# Patient Record
Sex: Male | Born: 1955 | Race: Black or African American | Hispanic: No | Marital: Married | Smoking: Current every day smoker
Health system: Southern US, Community
[De-identification: ages and names within clinical notes are randomized; demographics above are authoritative.]

## PROBLEM LIST (undated history)

## (undated) ENCOUNTER — Ambulatory Visit (HOSPITAL_COMMUNITY): Admission: EM | Source: Home / Self Care

## (undated) DIAGNOSIS — R0600 Dyspnea, unspecified: Secondary | ICD-10-CM

## (undated) DIAGNOSIS — E559 Vitamin D deficiency, unspecified: Secondary | ICD-10-CM

## (undated) DIAGNOSIS — E785 Hyperlipidemia, unspecified: Secondary | ICD-10-CM

## (undated) DIAGNOSIS — I10 Essential (primary) hypertension: Secondary | ICD-10-CM

## (undated) DIAGNOSIS — E538 Deficiency of other specified B group vitamins: Secondary | ICD-10-CM

## (undated) DIAGNOSIS — F17200 Nicotine dependence, unspecified, uncomplicated: Secondary | ICD-10-CM

## (undated) HISTORY — DX: Deficiency of other specified B group vitamins: E53.8

## (undated) HISTORY — PX: OTHER SURGICAL HISTORY: SHX169

## (undated) HISTORY — DX: Nicotine dependence, unspecified, uncomplicated: F17.200

## (undated) HISTORY — DX: Hyperlipidemia, unspecified: E78.5

## (undated) HISTORY — DX: Vitamin D deficiency, unspecified: E55.9

## (undated) HISTORY — PX: REPAIR OF PERFORATED ULCER: SHX6065

---

## 2000-02-09 ENCOUNTER — Emergency Department (HOSPITAL_COMMUNITY): Admission: EM | Admit: 2000-02-09 | Discharge: 2000-02-09 | Payer: Self-pay | Admitting: Emergency Medicine

## 2000-02-09 ENCOUNTER — Encounter: Payer: Self-pay | Admitting: Emergency Medicine

## 2000-02-14 ENCOUNTER — Ambulatory Visit (HOSPITAL_COMMUNITY): Admission: RE | Admit: 2000-02-14 | Discharge: 2000-02-14 | Payer: Self-pay | Admitting: Orthopedic Surgery

## 2000-02-14 ENCOUNTER — Encounter: Payer: Self-pay | Admitting: Orthopedic Surgery

## 2000-02-21 ENCOUNTER — Observation Stay (HOSPITAL_COMMUNITY): Admission: RE | Admit: 2000-02-21 | Discharge: 2000-02-22 | Payer: Self-pay | Admitting: Orthopedic Surgery

## 2001-06-22 ENCOUNTER — Emergency Department (HOSPITAL_COMMUNITY): Admission: EM | Admit: 2001-06-22 | Discharge: 2001-06-22 | Payer: Self-pay | Admitting: Emergency Medicine

## 2001-06-22 ENCOUNTER — Encounter: Payer: Self-pay | Admitting: Emergency Medicine

## 2001-11-10 ENCOUNTER — Encounter: Payer: Self-pay | Admitting: Emergency Medicine

## 2001-11-10 ENCOUNTER — Emergency Department (HOSPITAL_COMMUNITY): Admission: EM | Admit: 2001-11-10 | Discharge: 2001-11-10 | Payer: Self-pay | Admitting: Emergency Medicine

## 2001-12-29 ENCOUNTER — Emergency Department (HOSPITAL_COMMUNITY): Admission: EM | Admit: 2001-12-29 | Discharge: 2001-12-29 | Payer: Self-pay | Admitting: Emergency Medicine

## 2003-12-23 ENCOUNTER — Emergency Department (HOSPITAL_COMMUNITY): Admission: EM | Admit: 2003-12-23 | Discharge: 2003-12-23 | Payer: Self-pay | Admitting: Emergency Medicine

## 2005-09-26 ENCOUNTER — Emergency Department (HOSPITAL_COMMUNITY): Admission: EM | Admit: 2005-09-26 | Discharge: 2005-09-26 | Payer: Self-pay | Admitting: Emergency Medicine

## 2008-03-15 ENCOUNTER — Emergency Department (HOSPITAL_COMMUNITY): Admission: EM | Admit: 2008-03-15 | Discharge: 2008-03-15 | Payer: Self-pay | Admitting: Emergency Medicine

## 2009-08-26 ENCOUNTER — Inpatient Hospital Stay (HOSPITAL_COMMUNITY): Admission: EM | Admit: 2009-08-26 | Discharge: 2009-08-28 | Payer: Self-pay | Admitting: Emergency Medicine

## 2010-09-06 ENCOUNTER — Emergency Department (HOSPITAL_COMMUNITY)
Admission: EM | Admit: 2010-09-06 | Discharge: 2010-09-06 | Payer: Self-pay | Source: Home / Self Care | Admitting: Emergency Medicine

## 2010-12-12 LAB — DIFFERENTIAL
Basophils Absolute: 0 10*3/uL (ref 0.0–0.1)
Eosinophils Relative: 1 % (ref 0–5)
Lymphocytes Relative: 12 % (ref 12–46)
Neutro Abs: 6.7 10*3/uL (ref 1.7–7.7)
Neutrophils Relative %: 80 % — ABNORMAL HIGH (ref 43–77)

## 2010-12-12 LAB — BASIC METABOLIC PANEL
Calcium: 8.6 mg/dL (ref 8.4–10.5)
Chloride: 100 mEq/L (ref 96–112)
Creatinine, Ser: 0.8 mg/dL (ref 0.4–1.5)
GFR calc Af Amer: >60 mL/min (ref >60)

## 2010-12-12 LAB — ETHANOL: Alcohol, Ethyl (B): <5 mg/dL (ref 0–10)

## 2010-12-12 LAB — CBC
HCT: 41.2 % (ref 39.0–52.0)
Hemoglobin: 13 g/dL (ref 13.0–17.0)
Platelets: 343 10*3/uL (ref 150–400)
RBC: 3.67 MIL/uL — ABNORMAL LOW (ref 4.22–5.81)
RBC: 3.85 MIL/uL — ABNORMAL LOW (ref 4.22–5.81)
RDW: 15.1 % (ref 11.5–15.5)
WBC: 4.4 10*3/uL (ref 4.0–10.5)
WBC: 7 10*3/uL (ref 4.0–10.5)

## 2010-12-12 LAB — PHOSPHORUS
Phosphorus: 2.4 mg/dL (ref 2.3–4.6)
Phosphorus: 3.8 mg/dL (ref 2.3–4.6)

## 2010-12-12 LAB — CULTURE, BLOOD (ROUTINE X 2)

## 2010-12-12 LAB — CALCIUM: Calcium: 8.6 mg/dL (ref 8.4–10.5)

## 2010-12-12 LAB — POCT I-STAT, CHEM 8
BUN: 12 mg/dL (ref 6–23)
Potassium: 3.7 mEq/L (ref 3.5–5.1)
Sodium: 139 mEq/L (ref 135–145)
TCO2: 27 mmol/L (ref 0–100)

## 2010-12-12 LAB — MAGNESIUM: Magnesium: 1.8 mg/dL (ref 1.5–2.5)
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
## 2011-04-26 ENCOUNTER — Emergency Department (HOSPITAL_COMMUNITY): Payer: Self-pay

## 2011-04-26 ENCOUNTER — Emergency Department (HOSPITAL_COMMUNITY)
Admission: EM | Admit: 2011-04-26 | Discharge: 2011-04-26 | Disposition: A | Discharge status: Home / Self Care | Payer: Self-pay | Attending: Emergency Medicine | Admitting: Emergency Medicine

## 2011-04-26 DIAGNOSIS — R0609 Other forms of dyspnea: Secondary | ICD-10-CM | POA: Insufficient documentation

## 2011-04-26 DIAGNOSIS — M545 Low back pain, unspecified: Secondary | ICD-10-CM | POA: Insufficient documentation

## 2011-04-26 DIAGNOSIS — IMO0002 Reserved for concepts with insufficient information to code with codable children: Secondary | ICD-10-CM | POA: Insufficient documentation

## 2011-04-26 DIAGNOSIS — R0989 Other specified symptoms and signs involving the circulatory and respiratory systems: Secondary | ICD-10-CM | POA: Insufficient documentation

## 2011-04-26 DIAGNOSIS — T148XXA Other injury of unspecified body region, initial encounter: Secondary | ICD-10-CM | POA: Insufficient documentation

## 2011-04-26 DIAGNOSIS — R0602 Shortness of breath: Secondary | ICD-10-CM | POA: Insufficient documentation

## 2011-05-22 ENCOUNTER — Emergency Department (HOSPITAL_COMMUNITY): Payer: Self-pay

## 2011-05-22 ENCOUNTER — Encounter (HOSPITAL_COMMUNITY): Payer: Self-pay

## 2011-05-22 ENCOUNTER — Emergency Department (HOSPITAL_COMMUNITY)
Admission: EM | Admit: 2011-05-22 | Discharge: 2011-05-22 | Disposition: A | Discharge status: Home / Self Care | Payer: No Typology Code available for payment source | Attending: General Surgery | Admitting: General Surgery

## 2011-05-22 DIAGNOSIS — M25559 Pain in unspecified hip: Secondary | ICD-10-CM | POA: Insufficient documentation

## 2011-05-22 DIAGNOSIS — IMO0002 Reserved for concepts with insufficient information to code with codable children: Secondary | ICD-10-CM | POA: Insufficient documentation

## 2011-05-22 DIAGNOSIS — S7010XA Contusion of unspecified thigh, initial encounter: Secondary | ICD-10-CM | POA: Insufficient documentation

## 2011-05-22 DIAGNOSIS — M25529 Pain in unspecified elbow: Secondary | ICD-10-CM | POA: Insufficient documentation

## 2011-05-22 DIAGNOSIS — S7000XA Contusion of unspecified hip, initial encounter: Secondary | ICD-10-CM | POA: Insufficient documentation

## 2011-05-22 DIAGNOSIS — S0180XA Unspecified open wound of other part of head, initial encounter: Secondary | ICD-10-CM | POA: Insufficient documentation

## 2011-05-22 DIAGNOSIS — M542 Cervicalgia: Secondary | ICD-10-CM | POA: Insufficient documentation

## 2011-05-22 DIAGNOSIS — M79609 Pain in unspecified limb: Secondary | ICD-10-CM | POA: Insufficient documentation

## 2011-05-22 DIAGNOSIS — M546 Pain in thoracic spine: Secondary | ICD-10-CM | POA: Insufficient documentation

## 2011-05-22 DIAGNOSIS — M25519 Pain in unspecified shoulder: Secondary | ICD-10-CM | POA: Insufficient documentation

## 2011-05-22 DIAGNOSIS — R404 Transient alteration of awareness: Secondary | ICD-10-CM | POA: Insufficient documentation

## 2011-05-22 DIAGNOSIS — M25569 Pain in unspecified knee: Secondary | ICD-10-CM | POA: Insufficient documentation

## 2011-05-22 DIAGNOSIS — S0990XA Unspecified injury of head, initial encounter: Secondary | ICD-10-CM | POA: Insufficient documentation

## 2011-05-22 DIAGNOSIS — M545 Low back pain, unspecified: Secondary | ICD-10-CM | POA: Insufficient documentation

## 2011-05-22 DIAGNOSIS — R51 Headache: Secondary | ICD-10-CM | POA: Insufficient documentation

## 2011-05-22 LAB — ETHANOL: Alcohol, Ethyl (B): 47 mg/dL — ABNORMAL HIGH (ref 0–11)

## 2011-05-22 LAB — DIFFERENTIAL
Basophils Absolute: 0 10*3/uL (ref 0.0–0.1)
Basophils Relative: 1 % (ref 0–1)
Eosinophils Absolute: 0.2 10*3/uL (ref 0.0–0.7)
Neutrophils Relative %: 46 % (ref 43–77)

## 2011-05-22 LAB — CBC
Platelets: 235 10*3/uL (ref 150–400)
RBC: 4.22 MIL/uL (ref 4.22–5.81)
WBC: 5.6 10*3/uL (ref 4.0–10.5)

## 2011-05-22 LAB — POCT I-STAT, CHEM 8
Calcium, Ion: 1.16 mmol/L (ref 1.12–1.32)
Chloride: 107 meq/L (ref 96–112)
HCT: 47 % (ref 39.0–52.0)
Sodium: 141 meq/L (ref 135–145)
TCO2: 25 mmol/L (ref 0–100)

## 2011-05-22 LAB — LACTIC ACID, PLASMA: Lactic Acid, Venous: 2.6 mmol/L — ABNORMAL HIGH (ref 0.5–2.2)

## 2011-05-22 LAB — PROTIME-INR
INR: 1 (ref 0.00–1.49)
Prothrombin Time: 13.4 s (ref 11.6–15.2)

## 2011-05-22 MED ORDER — IOHEXOL 300 MG/ML  SOLN
100.0000 mL | Freq: Once | INTRAMUSCULAR | Status: AC | PRN
  Administered 2011-05-22: 100 mL via INTRAVENOUS

## 2014-04-24 ENCOUNTER — Encounter (HOSPITAL_COMMUNITY): Payer: Self-pay | Admitting: Emergency Medicine

## 2014-04-24 ENCOUNTER — Emergency Department (INDEPENDENT_AMBULATORY_CARE_PROVIDER_SITE_OTHER)
Admission: EM | Admit: 2014-04-24 | Discharge: 2014-04-24 | Disposition: A | Discharge status: Home / Self Care | Payer: Self-pay | Source: Home / Self Care | Attending: Family Medicine | Admitting: Family Medicine

## 2014-04-24 DIAGNOSIS — L259 Unspecified contact dermatitis, unspecified cause: Secondary | ICD-10-CM

## 2014-04-24 MED ORDER — PREDNISONE 10 MG PO TABS: 30.0000 mg | ORAL_TABLET | Freq: Every day | ORAL | Status: DC

## 2014-04-24 MED ORDER — HYDROXYZINE HCL 25 MG PO TABS: 25.0000 mg | ORAL_TABLET | Freq: Four times a day (QID) | ORAL | Status: DC | PRN

## 2014-04-24 NOTE — ED Provider Notes (Signed)
CSN: [id number]     Arrival date & time 04/24/14  1553 History   First MD Initiated Contact with Patient 04/24/14 1626     Chief Complaint  Patient presents with  . Poison Oak   (Consider location/radiation/quality/duration/timing/severity/associated sxs/prior Treatment) HPI Comments: Presents with a one week history of pruritic contact dermatitis on both forearms. 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 States symptoms usually resolve with application of Calamine lotion, but this episode seems to be lasting a bit longer.  Reports himself to be otherwise healthy.   The history is provided by the patient.    History reviewed. No pertinent past medical history. History reviewed. No pertinent past surgical history. No family history on file. History  Substance Use Topics  . Smoking status: Current Every Day Smoker -- 0.50 packs/day    Types: Cigarettes  . Smokeless tobacco: Not on file  . Alcohol Use: No    Review of Systems  All other systems reviewed and are negative.   Allergies  Review of patient's allergies indicates no known allergies.  Home Medications   Prior to Admission medications   Medication Sig Start Date End Date Taking? Authorizing Provider  hydrOXYzine (ATARAX/VISTARIL) 25 MG tablet Take 1 tablet (25 mg total) by mouth every 6 (six) hours as needed for itching. 04/24/14   Audelia Hives Elia Nunley, PA  predniSONE (DELTASONE) 10 MG tablet Take 3 tablets (30 mg total) by mouth daily. X 3 days 04/24/14   Annett Gula H Zakyla Tonche, PA   BP 135/79  Pulse 66  Temp(Src) 98.8 F (37.1 C) (Oral)  Resp 14  SpO2 100% Physical Exam  Nursing note and vitals reviewed. Constitutional: He is oriented to person, place, and time. He appears well-developed and well-nourished. No distress.  HENT:  Head: Normocephalic and atraumatic.  Eyes: Conjunctivae are normal. No scleral icterus.  Cardiovascular: Normal rate.   Pulmonary/Chest: Effort normal.   Musculoskeletal: Normal range of motion.  Neurological: He is alert and oriented to person, place, and time.  Skin: Skin is warm and dry.  +erythematous maculopapular rash at bilateral circumferential forearms.   Psychiatric: He has a normal mood and affect. His behavior is normal.    ED Course  Procedures (including critical care time) Labs Review Labs Reviewed - No data to display  Imaging Review No results found.   MDM   1. Contact dermatitis    Atarax and prednisone as prescribed with follow up if no improvement.    Lutricia Feil, [place] 04/24/14 [phone]

## 2014-04-24 NOTE — ED Notes (Addendum)
Patient c/o poison oak reaction on bilateral wrist. 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 Patient reports using Calamine lotion with no relief. Patient is alert and oriented and in no acute distress.

## 2014-04-24 NOTE — Discharge Instructions (Signed)

## 2014-04-25 NOTE — ED Provider Notes (Signed)
Medical screening examination/treatment/procedure(s) were performed by resident physician or non-physician practitioner and as supervising physician I was immediately available for consultation/collaboration.   Pauline Good MD.   Billy Fischer, MD 04/25/14 [phone]

## 2014-12-04 ENCOUNTER — Emergency Department (HOSPITAL_COMMUNITY): Payer: Self-pay

## 2014-12-04 ENCOUNTER — Encounter (HOSPITAL_COMMUNITY): Payer: Self-pay | Admitting: Nurse Practitioner

## 2014-12-04 ENCOUNTER — Emergency Department (HOSPITAL_COMMUNITY)
Admission: EM | Admit: 2014-12-04 | Discharge: 2014-12-04 | Disposition: A | Discharge status: Home / Self Care | Payer: Self-pay | Attending: Emergency Medicine | Admitting: Emergency Medicine

## 2014-12-04 DIAGNOSIS — Z720 Tobacco use: Secondary | ICD-10-CM | POA: Insufficient documentation

## 2014-12-04 DIAGNOSIS — S199XXA Unspecified injury of neck, initial encounter: Secondary | ICD-10-CM | POA: Insufficient documentation

## 2014-12-04 DIAGNOSIS — Y998 Other external cause status: Secondary | ICD-10-CM | POA: Insufficient documentation

## 2014-12-04 DIAGNOSIS — Z7952 Long term (current) use of systemic steroids: Secondary | ICD-10-CM | POA: Insufficient documentation

## 2014-12-04 DIAGNOSIS — S2231XA Fracture of one rib, right side, initial encounter for closed fracture: Secondary | ICD-10-CM | POA: Insufficient documentation

## 2014-12-04 DIAGNOSIS — S29092A Other injury of muscle and tendon of back wall of thorax, initial encounter: Secondary | ICD-10-CM | POA: Insufficient documentation

## 2014-12-04 DIAGNOSIS — Y9389 Activity, other specified: Secondary | ICD-10-CM | POA: Insufficient documentation

## 2014-12-04 DIAGNOSIS — Y92009 Unspecified place in unspecified non-institutional (private) residence as the place of occurrence of the external cause: Secondary | ICD-10-CM | POA: Insufficient documentation

## 2014-12-04 DIAGNOSIS — S0083XA Contusion of other part of head, initial encounter: Secondary | ICD-10-CM | POA: Insufficient documentation

## 2014-12-04 DIAGNOSIS — S60222A Contusion of left hand, initial encounter: Secondary | ICD-10-CM | POA: Insufficient documentation

## 2014-12-04 LAB — COMPREHENSIVE METABOLIC PANEL
ALK PHOS: 74 U/L (ref 39–117)
ALT: 23 U/L (ref 0–53)
AST: 33 U/L (ref 0–37)
Albumin: 4 g/dL (ref 3.5–5.2)
Anion gap: 7 (ref 5–15)
BILIRUBIN TOTAL: 0.8 mg/dL (ref 0.3–1.2)
BUN: 9 mg/dL (ref 6–23)
CO2: 27 mmol/L (ref 19–32)
Calcium: 9.3 mg/dL (ref 8.4–10.5)
Chloride: 105 mmol/L (ref 96–112)
Creatinine, Ser: 0.99 mg/dL (ref 0.50–1.35)
GFR, EST NON AFRICAN AMERICAN: 90 mL/min — AB (ref >90)
GLUCOSE: 99 mg/dL (ref 70–99)
POTASSIUM: 4.4 mmol/L (ref 3.5–5.1)
SODIUM: 139 mmol/L (ref 135–145)
TOTAL PROTEIN: 7.6 g/dL (ref 6.0–8.3)

## 2014-12-04 LAB — CBC WITH DIFFERENTIAL/PLATELET
BASOS ABS: 0 10*3/uL (ref 0.0–0.1)
Basophils Relative: 0 % (ref 0–1)
EOS ABS: 0.1 10*3/uL (ref 0.0–0.7)
EOS PCT: 1 % (ref 0–5)
HCT: 46.3 % (ref 39.0–52.0)
HEMOGLOBIN: 15.6 g/dL (ref 13.0–17.0)
Lymphocytes Relative: 20 % (ref 12–46)
Lymphs Abs: 1 10*3/uL (ref 0.7–4.0)
MCH: 35.6 pg — ABNORMAL HIGH (ref 26.0–34.0)
MCHC: 33.7 g/dL (ref 30.0–36.0)
MCV: 105.7 fL — ABNORMAL HIGH (ref 78.0–100.0)
Monocytes Absolute: 0.8 10*3/uL (ref 0.1–1.0)
Monocytes Relative: 16 % — ABNORMAL HIGH (ref 3–12)
NEUTROS ABS: 3.1 10*3/uL (ref 1.7–7.7)
NEUTROS PCT: 63 % (ref 43–77)
Platelets: 241 10*3/uL (ref 150–400)
RBC: 4.38 MIL/uL (ref 4.22–5.81)
RDW: 14.4 % (ref 11.5–15.5)
WBC: 4.9 10*3/uL (ref 4.0–10.5)

## 2014-12-04 LAB — I-STAT TROPONIN, ED: Troponin i, poc: 0 ng/mL (ref 0.00–0.08)

## 2014-12-04 MED ORDER — OXYCODONE-ACETAMINOPHEN 5-325 MG PO TABS: 1.0000 | ORAL_TABLET | Freq: Four times a day (QID) | ORAL | Status: DC | PRN

## 2014-12-04 MED ORDER — HYDROMORPHONE HCL 1 MG/ML IJ SOLN
2.0000 mg | Freq: Once | INTRAMUSCULAR | Status: AC
  Administered 2014-12-04: 2 mg via INTRAMUSCULAR
  Filled 2014-12-04: qty 2

## 2014-12-04 MED ORDER — KETOROLAC TROMETHAMINE 60 MG/2ML IM SOLN
60.0000 mg | Freq: Once | INTRAMUSCULAR | Status: AC
  Administered 2014-12-04: 60 mg via INTRAMUSCULAR
  Filled 2014-12-04: qty 2

## 2014-12-04 MED ORDER — OXYCODONE-ACETAMINOPHEN 5-325 MG PO TABS
2.0000 | ORAL_TABLET | Freq: Once | ORAL | Status: AC
  Administered 2014-12-04: 2 via ORAL
  Filled 2014-12-04: qty 2
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
## 2014-12-04 NOTE — Discharge Instructions (Signed)
Rib Fracture °A rib fracture is a break or crack in one of the bones of the ribs. The ribs are a group of long, curved bones that wrap around your chest and attach to your spine. They protect your lungs and other organs in the chest cavity. A broken or cracked rib is often painful, but most do not cause other problems. Most rib fractures heal on their own over time. However, rib fractures can be more serious if multiple ribs are broken or if broken ribs move out of place and push against other structures. °CAUSES  °· A direct blow to the chest. For example, this could happen during contact sports, a car accident, or a fall against a hard object. °· Repetitive movements with high force, such as pitching a baseball or having severe coughing spells. °SYMPTOMS  °· Pain when you breathe in or cough. °· Pain when someone presses on the injured area. °DIAGNOSIS  °Your caregiver will perform a physical exam. Various imaging tests may be ordered to confirm the diagnosis and to look for related injuries. These tests may include a chest X-ray, computed tomography (CT), magnetic resonance imaging (MRI), or a bone scan. °TREATMENT  °Rib fractures usually heal on their own in 1-3 months. The longer healing period is often associated with a continued cough or other aggravating activities. During the healing period, pain control is very important. Medication is usually given to control pain. Hospitalization or surgery may be needed for more severe injuries, such as those in which multiple ribs are broken or the ribs have moved out of place.  °HOME CARE INSTRUCTIONS  °· Avoid strenuous activity and any activities or movements that cause pain. Be careful during activities and avoid bumping the injured rib. °· Gradually increase activity as directed by your caregiver. °· Only take over-the-counter or prescription medications as directed by your caregiver. Do not take other medications without asking your caregiver first. °· Apply ice  to the injured area for the first 1-2 days after you have been treated or as directed by your caregiver. Applying ice helps to reduce inflammation and pain. °¨ Put ice in a plastic bag. °¨ Place a towel between your skin and the bag.   °¨ Leave the ice on for 15-20 minutes at a time, every 2 hours while you are awake. °· Perform deep breathing as directed by your caregiver. This will help prevent pneumonia, which is a common complication of a broken rib. Your caregiver may instruct you to: °¨ Take deep breaths several times a day. °¨ Try to cough several times a day, holding a pillow against the injured area. °¨ Use a device called an incentive spirometer to practice deep breathing several times a day. °· Drink enough fluids to keep your urine clear or pale yellow. This will help you avoid constipation.   °· Do not wear a rib belt or binder. These restrict breathing, which can lead to pneumonia.   °SEEK IMMEDIATE MEDICAL CARE IF:  °· You have a fever.   °· You have difficulty breathing or shortness of breath.   °· You develop a continual cough, or you cough up thick or bloody sputum. °· You feel sick to your stomach (nausea), throw up (vomit), or have abdominal pain.   °· You have worsening pain not controlled with medications.   °MAKE SURE YOU: °· Understand these instructions. °· Will watch your condition. °· Will get help right away if you are not doing well or get worse. °Document Released: 08/28/2005 Document Revised: 04/30/2013 Document Reviewed:   10/30/2012 ExitCare Patient Information 2015 [place], [place]. This information is not intended to replace advice given to you by your health care provider. Make sure you discuss any questions you have with your health care provider.   Emergency Department Resource Guide 1) Find a Doctor and Pay Out of Pocket Although you won't have to find out who is covered by your insurance plan, it is a good idea to ask around and get recommendations. You will then need to  call the office and see if the doctor you have chosen will accept you as a new patient and what types of options they offer for patients who are self-pay. Some doctors offer discounts or will set up payment plans for their patients who do not have insurance, but you will need to ask so you aren't surprised when you get to your appointment.  2) Contact Your Local Health Department Not all health departments have doctors that can see patients for sick visits, but many do, so it is worth a call to see if yours does. If you don't know where your local health department is, you can check in your phone book. The CDC also has a tool to help you locate your state's health department, and many state websites also have listings of all of their local health departments.  3) Find a [facility] If your illness is not likely to be very severe or complicated, you may want to try a walk in clinic. These are popping up all over the country in pharmacies, drugstores, and shopping centers. They're usually staffed by nurse practitioners or physician assistants that have been trained to treat common illnesses and complaints. They're usually fairly quick and inexpensive. However, if you have serious medical issues or chronic medical problems, these are probably not your best option.  No Primary Care Doctor: - Call Health Connect at  [phone] - they can help you locate a primary care doctor that  accepts your insurance, provides certain services, etc. - Physician Referral Service- [phone]  Chronic Pain Problems: Organization         Address  Phone   Notes  [facility]  [phone] Patients need to be referred by their primary care doctor.   Medication Assistance: Organization         Address  Phone   Notes  [facility] Medication [facility] [address] [place], [place] [address] [phone] --Must be a resident of [facility] -- Must have NO insurance  coverage whatsoever (no Medicaid/ Medicare, etc.) -- The pt. MUST have a primary care doctor that directs their care regularly and follows them in the community   [facility]ist  [phone]   [employer]  [phone]    Agencies that provide inexpensive medical care: Organization         Address  Phone   Notes  [place]  [phone]   [facility]    [phone]   [facility] [place], [place] [address] [phone]   [place] [address], [place] [phone]   Planned Parenthood    [phone]   [facility]    [phone]   [place] [address], [place] Phone:  [phone], Fax:  [phone] Hours of Operation:  9 am - 6 pm, M-F.  Also accepts Medicaid/Medicare and self-pay.  [facility]  [place], [address], [place] Phone: [phone], Fax: [phone]. Hours of Operation:  8:30 am - 5:30 pm, M-F.  Also accepts Medicaid and self-pay.  [facility] [place] [address], [place] Phone: [phone]   [place], [place], [place] [phone], Ext. [phone] Mondays & Thursdays: 7-9 AM.  First 15 patients are seen on a first come, first serve basis.    [place] Providers:  Organization         Address  Phone   Notes  [facility] [address], [place] [phone] Also accepts self-pay patients.  [facility] [address], [place]  [phone]   [place], [address], [place] [phone]   [facility] [address] [place] [phone]   Lucianne Lei [address], [place]   [phone] Only accepts [place] Access [place] patients after they have their  name applied to their card.   Self-Pay (no insurance) in [facility]:  Organization         Address  Phone   Notes  Sickle Cell Patients, [facility] [place] [phone]   [facility] Urgent Care [address] [phone]   [facility] [employer]  [place], [place], [place] [phone]   [facility]/Dr. Osei-Bonsu  [address] [place] or [address], [place] [phone] Phone number for both [place] and [place] locations is the same.  Urgent Medical and [facility] [address] [place] [phone]   [facility] [place] [address] [place] or [address] [phone]   [facility] [address] [place] [phone], phone; [phone], fax Sees patients 1st and 3rd Saturday of every month.  Must not qualify for public or private insurance (i.e. Medicaid, Medicare, [place] Health Choice, Veterans' Benefits)  Household income should be no more than 200% of the poverty level The clinic cannot treat you if you are pregnant or think you are pregnant  Sexually transmitted diseases are not treated at the clinic.    Dental Care: Organization         Address  Phone  Notes  [facility] Department of [facility] [place] [phone] Accepts children up to age 17 who are enrolled in [place] or [place]; pregnant women with a Medicaid card; and children who have applied for Medicaid or [place] Health Choice, but were declined, whose parents can pay a reduced fee at time of service.  [facility] Department of [facility]  [address], [place] [phone] Accepts children up to age 61 who are enrolled in [place] or [place]; pregnant women with a Medicaid card; and children who have applied for  Medicaid or [place] Health Choice, but were declined, whose parents can pay a reduced fee at time of service.  [facility]  [employer] [phone] Patients are seen by appointment only. Walk-ins are not accepted. [place] will see patients 33 years of age and older. Monday - Tuesday (8am-5pm) Most Wednesdays (8:30-5pm) $30 per visit,  cash only  [employer] Adult [employer] PROGRAM  [address], [place] [phone] Patients are seen by appointment only. Walk-ins are not accepted. [place] will see patients 90 years of age and older. One Wednesday Evening (Monthly: Volunteer Based).  $30 per visit, cash only  [place]  [phone] for adults; Children under age 71, call Graduate Pediatric Dentistry at [phone]. Children aged 14-14, please call [phone] to request a pediatric application.  Dental services are provided in all areas of dental care including fillings, crowns and bridges, complete and partial dentures, implants, gum treatment, root canals, and extractions. Preventive care is also provided. Treatment is provided to both adults and children. Patients are selected via a lottery and there is often a waiting list.   [facility] [address], [place]  [phone] [email or web address]   [facility] [address] [place], [place] [phone], Ext. [phone] Second and Fourth Thursday of each month, opens at 6:30 AM; Clinic ends at 9 AM.  Patients are seen on a first-come first-served basis, and a limited number are seen during each clinic.   [facility]  [address] Hillard Danker [place], [place] [phone]   Eligibility Requirements You must have lived in [place], [place], or [place] counties for at least the last three months.   You cannot be eligible for state or federal sponsored [employer], including [employer], [place],  or [employer].   You generally cannot be eligible for healthcare insurance through your employer.    How to apply: Eligibility screenings are held every Tuesday and Wednesday afternoon from 1:00 pm until 4:00 pm. You do not need an appointment for the interview!  [facility] [address] [place], [place]   [place]  [place] Department  Benjamin Perez  [phone]    Behavioral Health Resources in the Community: Intensive Outpatient Programs Organization         Address  Phone  Notes  [place] Kevin. [address] [place], [place] [phone]   [facility] [address], [place], [place]   ADS: [facility] [address] [place], [place]   [place] [address],  [place], [place] or [phone]   Substance Abuse Resources Organization         Address  Phone  Notes  Alcohol and Drug Services  [phone]   [place]  [phone]   [place]   Chinita Pester  [phone]   Residential & Outpatient Substance Abuse Program  [phone]   Psychological Services Organization         Address  Phone  Notes  [facility]  [place]  [phone]   [place] [address], [place] or [phone]    Mobile Crisis Teams Organization         Address  Phone  Notes  [facility], Mobile Crisis Care Unit  [phone]   [facility]  [address] [place], [place]   [place] Levels [address], [place] [phone]    Self-Help/Support Groups Organization         Address  Phone  Notes  [facility] [place] - variety of support groups   [place] Call for more information  Narcotics Anonymous (NA), [facility] [address], [employer] [place]  2 meetings at this location   Special educational needs teacher         Address  Phone  Notes  [facility] Residential Treatment [place],    [place]  [phone]   [facility]  [address] [place] 729021, [place], [place]   [place], [place] [phone] Admissions: 8am-3pm M-F  Incentives Substance [place] [address],    [place], [place] [phone]   [facility] [address] [place], [place], [place]   [facility] [address]  [place], [place]   [facility] Programs - Intensive Outpatient [address] Kristeen [address], [place], [place]   [facility] [place].,  [place], [place] [phone] or [phone]   [facility] Services (RTS) [address], [place], [place] Accepts Medicaid  [facility] [place] [address]  [place] [phone] Substance Abuse/Addiction Treatment   [facility] Organization         Address  Phone  Notes  [facility]  [phone]   Domenic Schwab, PhD [address] Arlis Porta [place], [place]   [phone] or [phone]   [place] [address] [place], [place] [phone]   [facility] [address] [place], [place] [phone] Insurance/Medicaid/sponsorship through [facility] and Families [address]                                    [place], [place] [phone] [place] [address], [place] [phone]    Dr. Adele Schilder  [phone]   [facility] Dept. 1) [address], [place] 2) [place] 3)  [place] [address], [place] [phone]  [phone]   [place] [phone] or [phone] (After Hours)

## 2015-07-12 ENCOUNTER — Emergency Department (HOSPITAL_COMMUNITY): Payer: Self-pay

## 2015-07-12 ENCOUNTER — Emergency Department (HOSPITAL_COMMUNITY)
Admission: EM | Admit: 2015-07-12 | Discharge: 2015-07-12 | Disposition: A | Discharge status: Home / Self Care | Payer: Self-pay | Attending: Emergency Medicine | Admitting: Emergency Medicine

## 2015-07-12 ENCOUNTER — Encounter (HOSPITAL_COMMUNITY): Payer: Self-pay | Admitting: Emergency Medicine

## 2015-07-12 DIAGNOSIS — K2970 Gastritis, unspecified, without bleeding: Secondary | ICD-10-CM | POA: Insufficient documentation

## 2015-07-12 DIAGNOSIS — I10 Essential (primary) hypertension: Secondary | ICD-10-CM | POA: Insufficient documentation

## 2015-07-12 DIAGNOSIS — Z8579 Personal history of other malignant neoplasms of lymphoid, hematopoietic and related tissues: Secondary | ICD-10-CM | POA: Insufficient documentation

## 2015-07-12 DIAGNOSIS — H538 Other visual disturbances: Secondary | ICD-10-CM | POA: Insufficient documentation

## 2015-07-12 DIAGNOSIS — R42 Dizziness and giddiness: Secondary | ICD-10-CM | POA: Insufficient documentation

## 2015-07-12 DIAGNOSIS — Z862 Personal history of diseases of the blood and blood-forming organs and certain disorders involving the immune mechanism: Secondary | ICD-10-CM | POA: Insufficient documentation

## 2015-07-12 DIAGNOSIS — R1032 Left lower quadrant pain: Secondary | ICD-10-CM

## 2015-07-12 DIAGNOSIS — Z720 Tobacco use: Secondary | ICD-10-CM | POA: Insufficient documentation

## 2015-07-12 DIAGNOSIS — I509 Heart failure, unspecified: Secondary | ICD-10-CM | POA: Insufficient documentation

## 2015-07-12 DIAGNOSIS — M25441 Effusion, right hand: Secondary | ICD-10-CM

## 2015-07-12 DIAGNOSIS — M7989 Other specified soft tissue disorders: Secondary | ICD-10-CM | POA: Insufficient documentation

## 2015-07-12 DIAGNOSIS — E119 Type 2 diabetes mellitus without complications: Secondary | ICD-10-CM | POA: Insufficient documentation

## 2015-07-12 LAB — COMPREHENSIVE METABOLIC PANEL
ALBUMIN: 4 g/dL (ref 3.5–5.0)
ALK PHOS: 72 U/L (ref 38–126)
ALT: 23 U/L (ref 17–63)
AST: 27 U/L (ref 15–41)
Anion gap: 8 (ref 5–15)
BILIRUBIN TOTAL: 0.8 mg/dL (ref 0.3–1.2)
BUN: 13 mg/dL (ref 6–20)
CALCIUM: 9.6 mg/dL (ref 8.9–10.3)
CO2: 27 mmol/L (ref 22–32)
CREATININE: 1.01 mg/dL (ref 0.61–1.24)
Chloride: 107 mmol/L (ref 101–111)
GFR calc Af Amer: >60 mL/min (ref >60)
GLUCOSE: 94 mg/dL (ref 65–99)
Potassium: 4.3 mmol/L (ref 3.5–5.1)
Sodium: 142 mmol/L (ref 135–145)
TOTAL PROTEIN: 7.9 g/dL (ref 6.5–8.1)

## 2015-07-12 LAB — URINALYSIS, ROUTINE W REFLEX MICROSCOPIC
BILIRUBIN URINE: NEGATIVE
Glucose, UA: NEGATIVE mg/dL
HGB URINE DIPSTICK: NEGATIVE
Ketones, ur: NEGATIVE mg/dL
Leukocytes, UA: NEGATIVE
Nitrite: NEGATIVE
PROTEIN: NEGATIVE mg/dL
SPECIFIC GRAVITY, URINE: 1.017 (ref 1.005–1.030)
UROBILINOGEN UA: 0.2 mg/dL (ref 0.0–1.0)
pH: 6.5 (ref 5.0–8.0)

## 2015-07-12 LAB — CBC
HEMATOCRIT: 45.3 % (ref 39.0–52.0)
Hemoglobin: 15.4 g/dL (ref 13.0–17.0)
MCH: 35.2 pg — ABNORMAL HIGH (ref 26.0–34.0)
MCHC: 34 g/dL (ref 30.0–36.0)
MCV: 103.4 fL — ABNORMAL HIGH (ref 78.0–100.0)
PLATELETS: 245 10*3/uL (ref 150–400)
RBC: 4.38 MIL/uL (ref 4.22–5.81)
RDW: 14.3 % (ref 11.5–15.5)
WBC: 5.2 10*3/uL (ref 4.0–10.5)

## 2015-07-12 LAB — LIPASE, BLOOD: Lipase: 29 U/L (ref 11–51)

## 2015-07-12 MED ORDER — IBUPROFEN 400 MG PO TABS
400.0000 mg | ORAL_TABLET | Freq: Once | ORAL | Status: AC
  Administered 2015-07-12: 400 mg via ORAL

## 2015-07-12 MED ORDER — PANTOPRAZOLE SODIUM 20 MG PO TBEC: 20.0000 mg | DELAYED_RELEASE_TABLET | Freq: Every day | ORAL | Status: DC

## 2015-07-12 MED ORDER — IOHEXOL 300 MG/ML  SOLN: 80.0000 mL | Freq: Once | INTRAMUSCULAR | Status: DC | PRN

## 2015-07-12 MED ORDER — MORPHINE SULFATE (PF) 4 MG/ML IV SOLN
4.0000 mg | Freq: Once | INTRAVENOUS | Status: AC
Start: 2015-07-12 — End: 2015-07-12
  Administered 2015-07-12: 4 mg via INTRAVENOUS
  Filled 2015-07-12: qty 1

## 2015-07-12 MED ORDER — IOHEXOL 350 MG/ML SOLN
80.0000 mL | Freq: Once | INTRAVENOUS | Status: AC | PRN
  Administered 2015-07-12: 80 mL via INTRAVENOUS

## 2015-07-12 MED ORDER — ONDANSETRON 4 MG PO TBDP: 8.0000 mg | ORAL_TABLET | Freq: Once | ORAL | Status: DC

## 2015-07-12 MED ORDER — PANTOPRAZOLE SODIUM 40 MG PO TBEC
40.0000 mg | DELAYED_RELEASE_TABLET | Freq: Once | ORAL | Status: AC
  Administered 2015-07-12: 40 mg via ORAL
  Filled 2015-07-12: qty 1

## 2015-07-12 MED ORDER — IBUPROFEN 400 MG PO TABS
ORAL_TABLET | ORAL | Status: AC
  Filled 2015-07-12: qty 1

## 2015-07-12 MED ORDER — SODIUM CHLORIDE 0.9 % IV BOLUS (SEPSIS)
1000.0000 mL | Freq: Once | INTRAVENOUS | Status: AC
  Administered 2015-07-12: 1000 mL via INTRAVENOUS

## 2015-07-12 MED ORDER — ONDANSETRON 4 MG PO TBDP: 4.0000 mg | ORAL_TABLET | Freq: Three times a day (TID) | ORAL | Status: DC | PRN

## 2015-07-12 MED ORDER — ONDANSETRON HCL 4 MG/2ML IJ SOLN
4.0000 mg | Freq: Once | INTRAMUSCULAR | Status: AC
  Administered 2015-07-12: 4 mg via INTRAVENOUS
  Filled 2015-07-12: qty 2

## 2015-07-12 MED ORDER — ONDANSETRON HCL 4 MG/2ML IJ SOLN: 4.0000 mg | Freq: Once | INTRAMUSCULAR | Status: DC

## 2015-07-12 NOTE — ED Provider Notes (Signed)
CSN: [id number]     Arrival date & time 07/12/15  1400 History   First MD Initiated Contact with Patient 07/12/15 1543     Chief Complaint  Patient presents with  . Abdominal Pain  . Abscess     (Consider location/radiation/quality/duration/timing/severity/associated sxs/prior Treatment) HPI Comments: Patient is a 59 year old male with past medical history of CHF, DM, HTN, multiple myeloma, sickle cell anemia who presents to the ED with complaint of left abdominal and left groin pain, onset 4-5 months. Patient reports he has been having a constant sharp pain to the left side of his abdomen that radiates to his left groin and notes the pain has worsened over the past week, denies any aggravating or alleviating factors. He reports having a "knot on his left groin". Endorses associated nausea, subjective fever, chills. Patient reports a history of a perforated ulcer requiring surgery in 2010, denies any other abdominal surgeries. He notes he has been taking Tylenol at home for pain with no relief. He also reports having a headache that he describes as "feeling dizzy" that is aggravated with any movement, sitting up, walking and notes it is relieved with lying still. He describes the dizziness as "room spinning". Patient also states having an abscess to his right third and fourth nailbeds that have been present for the past 3 weeks. He reports having worsening swelling to the area with clear drainage. He notes he has had abscesses to his fingers/nails in the past requiring I&D in the ED.   History reviewed. No pertinent past medical history. History reviewed. No pertinent past surgical history. No family history on file. Social History  Substance Use Topics  . Smoking status: Current Every Day Smoker -- 0.50 packs/day    Types: Cigarettes  . Smokeless tobacco: None  . Alcohol Use: No    Review of Systems  Constitutional: Positive for fever and chills.  Eyes: Positive for visual disturbance  (room spinning).  Gastrointestinal: Positive for nausea and abdominal pain.  Genitourinary:       Left groin pain.  Skin: Positive for wound (finger abscess).  Neurological: Positive for dizziness and headaches.  All other systems reviewed and are negative.     Allergies  Review of patient's allergies indicates no known allergies.  Home Medications   Prior to Admission medications   Medication Sig Start Date End Date Taking? Authorizing Provider  acetaminophen (TYLENOL) 500 MG tablet Take 1,000 mg by mouth every 6 (six) hours as needed for moderate pain.   Yes Historical Provider, MD  RaNITidine HCl (ACID REDUCER PO) Take 1 tablet by mouth 2 (two) times daily as needed (FOR HEARTBURN).   Yes Historical Provider, MD  hydrOXYzine (ATARAX/VISTARIL) 25 MG tablet Take 1 tablet (25 mg total) by mouth every 6 (six) hours as needed for itching. Patient not taking: Reported on 12/04/2014 04/24/14   Audelia Hives Presson, PA  ondansetron (ZOFRAN ODT) 4 MG disintegrating tablet Take 1 tablet (4 mg total) by mouth every 8 (eight) hours as needed for nausea or vomiting. 07/12/15   Nona Dell, PA-C  oxyCODONE-acetaminophen (PERCOCET/ROXICET) 5-325 MG per tablet Take 1-2 tablets by mouth every 6 (six) hours as needed for severe pain. 12/04/14   Blanchie Dessert, MD  pantoprazole (PROTONIX) 20 MG tablet Take 1 tablet (20 mg total) by mouth daily. 07/12/15   Nona Dell, PA-C  predniSONE (DELTASONE) 10 MG tablet Take 3 tablets (30 mg total) by mouth daily. X 3 days Patient not taking: Reported on 12/04/2014  04/24/14   Annett Gula H Presson, PA   BP 116/93 mmHg  Pulse 76  Temp(Src) 98.1 F (36.7 C) (Oral)  Resp 18  Ht 5' 9"  (1.753 m)  Wt 124 lb 1 oz (56.274 kg)  BMI 18.31 kg/m2  SpO2 100% Physical Exam  Constitutional: He is oriented to person, place, and time. He appears well-developed and well-nourished. No distress.  HENT:  Head: Normocephalic and atraumatic.  Right  Ear: Tympanic membrane normal.  Left Ear: Tympanic membrane normal.  Nose: Nose normal. Right sinus exhibits no maxillary sinus tenderness and no frontal sinus tenderness. Left sinus exhibits no maxillary sinus tenderness and no frontal sinus tenderness.  Mouth/Throat: Uvula is midline, oropharynx is clear and moist and mucous membranes are normal. No oropharyngeal exudate.  Eyes: Conjunctivae and EOM are normal. Pupils are equal, round, and reactive to light. Right eye exhibits no discharge. Left eye exhibits no discharge. No scleral icterus.    Neck: Normal range of motion. Neck supple.  Cardiovascular: Normal rate, regular rhythm, normal heart sounds and intact distal pulses.   No murmur heard. Pulmonary/Chest: Effort normal and breath sounds normal. No respiratory distress. He has no wheezes. He has no rales. He exhibits no tenderness.  Abdominal: Soft. Bowel sounds are normal. He exhibits no distension and no mass. There is tenderness (diffuse). There is guarding. There is no rebound. Hernia confirmed negative in the right inguinal area and confirmed negative in the left inguinal area.  Pt reports LUQ and LLQ are more TTP during exam.   Genitourinary: Testes normal and penis normal. Circumcised.  Musculoskeletal: Normal range of motion. He exhibits no edema or tenderness.  Lymphadenopathy:    He has no cervical adenopathy.       Right: No inguinal adenopathy present.       Left: No inguinal adenopathy present.  Neurological: He is alert and oriented to person, place, and time. He has normal strength. No cranial nerve deficit or sensory deficit. Coordination normal.  Skin: Skin is warm and dry. He is not diaphoretic.  Mild amount of swelling noted to right 3rd and 4th proximal nail folds, no induration/fluctuance, no drainage, no erythema.   Nursing note and vitals reviewed.   ED Course  Procedures (including critical care time) Labs Review Labs Reviewed  CBC - Abnormal; Notable for  the following:    MCV 103.4 (*)    MCH 35.2 (*)    All other components within normal limits  LIPASE, BLOOD  COMPREHENSIVE METABOLIC PANEL  URINALYSIS, ROUTINE W REFLEX MICROSCOPIC (NOT AT [facility])    Imaging Review Ct Abdomen Pelvis W Contrast  07/12/2015  CLINICAL DATA:  Initial encounter for abdominal pain for about 1 week. EXAM: CT ABDOMEN AND PELVIS WITH CONTRAST TECHNIQUE: Multidetector CT imaging of the abdomen and pelvis was performed using the standard protocol following bolus administration of intravenous contrast. CONTRAST:  22m OMNIPAQUE IOHEXOL 350 MG/ML SOLN COMPARISON:  05/22/2011. FINDINGS: Lower chest:  Unremarkable. Hepatobiliary: 13 mm well-defined low-density lesion in the dome of the liver is stable, likely a cyst. Other scattered tiny hypodensities in the liver parenchyma are too small to characterize but appear stable in the interval. There is no evidence for gallstones, gallbladder wall thickening, or pericholecystic fluid. No intrahepatic or extrahepatic biliary dilation. Pancreas: No focal mass lesion. No dilatation of the main duct. No intraparenchymal cyst. No peripancreatic edema. Spleen: No splenomegaly. No focal mass lesion. Adrenals/Urinary Tract: Thickening of the left adrenal gland is unchanged in the interval. Right adrenal  gland is normal in appearance. Kidneys have normal CT imaging features. No evidence for hydroureter. The urinary bladder appears normal for the degree of distention. Stomach/Bowel: Stomach is nondistended which may account for the apparent gastric wall thickening. Duodenum is normally positioned as is the ligament of Treitz. Mild small bowel distention is noted without overt small bowel obstruction. The terminal ileum is normal. Appendix is gas-filled and nondilated. No gross colonic mass. No colonic wall thickening. No substantial diverticular change. Vascular/Lymphatic: There is abdominal aortic atherosclerosis without aneurysm. There is no  gastrohepatic or hepatoduodenal ligament lymphadenopathy. No intraperitoneal or retroperitoneal lymphadenopy. No pelvic sidewall lymphadenopathy. Reproductive: The prostate gland and seminal vesicles have normal imaging features. Other: No intraperitoneal free fluid. Musculoskeletal: Small sclerotic focus in the left iliac bone, at the SI joint, is stable. Bone windows reveal no worrisome lytic or sclerotic osseous lesions. IMPRESSION: 1. Apparent diffuse wall thickening in the stomach. Stomach is not well distended which may account for this appearance, but gastritis would be a consideration based on imaging. Upper endoscopy or upper GI series may prove helpful to further evaluate. 2. Scattered stable hypodensities in the liver parenchyma, likely cysts. Electronically Signed   By: Misty Stanley M.D.   On: 07/12/2015 18:48   I have personally reviewed and evaluated these images and lab results as part of my medical decision-making.  Filed Vitals:   07/12/15 1923  BP: 116/93  Pulse: 76  Temp:   Resp: 18     MDM   Final diagnoses:  Gastritis  Left groin pain  Finger joint swelling, right    Patient presents to the ED with complaint of left sided abdominal pain, left groin pain, headache (dizziness- "room spinning"), nausea and finger swelling. He reports having abdominal pain for the past 4-5 months. Reports history of gastric ulcer perforation requiring surgery, denies any other prior abdominal surgeries. VSS. Exam reveals mild diffuse abdominal tenderness, patient reports left side more tender than right during exam, no peritoneal signs. GU exam revealed no lymphadenopathy, no inguinal hernia, penile and testicular exam unremarkable. Mild amount of swelling noted to right 3rd and 4th proximal nail folds, no induration/fluctuance, no drainage, no erythema. Pinguecula noted to right medial conjunctiva, no extension into cornea. Neuro exam unremarkable.   UA and Labs unremarkable. CT abdomen and  pelvic revealed diffuse wall thickening in stomach. I suspect pt's abdominal pain is likely due to gastritis and left groin pain may be due to groin muscle strain. GU exam revealed no lymphadenopathy, hernias, testicular pain/swelling. I do not feel that any further workup or imaging is warranted at this time. Swelling to nail folds is mild and I do not feel that I&D is needed at this time. No bony abnormality or deformity, no erythema or excessive heat, no evidence of cellulitis, or septic joint  Pt advised to use warm compresses for his finger swelling.    Plan to d/c pt home. Pt given PPI and zofran. Follow up given for PCP regarding gastritis/groin pain and ortho/hand for finger swelling. Evaluation does not show pathology requring ongoing emergent intervention or admission. Pt is hemodynamically stable and mentating appropriately. Discussed findings/results and plan with patient/guardian, who agrees with plan. All questions answered. Return precautions discussed and outpatient follow up given.      Chesley Noon [place], [place] 07/12/15 [place] Yao, MD 07/12/15 [phone]

## 2015-07-12 NOTE — Discharge Instructions (Signed)
Take your medications as prescribed. You may also take Tylenol as prescribed over the counter for pain relief. Use warm compresses to your right fingers to help reduce the swelling.  Please follow up with a primary care provider from the Resource Guide provided below in 1 week. Follow up with Orthopedics- Hand regarding further treatment of your hand/nails.  Please return to the Emergency Department if symptoms worsen or new onset of fever, vomiting, vomiting blood, difficulty urinating, blood in urine, testicular pain, penile discharge, pain with urination.   Emergency Department Resource Guide 1) Find a Doctor and Pay Out of Pocket Although you won't have to find out who is covered by your insurance plan, it is a good idea to ask around and get recommendations. You will then need to call the office and see if the doctor you have chosen will accept you as a new patient and what types of options they offer for patients who are self-pay. Some doctors offer discounts or will set up payment plans for their patients who do not have insurance, but you will need to ask so you aren't surprised when you get to your appointment.  2) Contact Your Local Health Department Not all health departments have doctors that can see patients for sick visits, but many do, so it is worth a call to see if yours does. If you don't know where your local health department is, you can check in your phone book. The CDC also has a tool to help you locate your state's health department, and many state websites also have listings of all of their local health departments.  3) Find a [facility] If your illness is not likely to be very severe or complicated, you may want to try a walk in clinic. These are popping up all over the country in pharmacies, drugstores, and shopping centers. They're usually staffed by nurse practitioners or physician assistants that have been trained to treat common illnesses and complaints. They're usually  fairly quick and inexpensive. However, if you have serious medical issues or chronic medical problems, these are probably not your best option.  No Primary Care Doctor: - Call Health Connect at  [phone] - they can help you locate a primary care doctor that  accepts your insurance, provides certain services, etc. - Physician Referral Service- [phone]  Chronic Pain Problems: Organization         Address  Phone   Notes  [facility]  [phone] Patients need to be referred by their primary care doctor.   Medication Assistance: Organization         Address  Phone   Notes  [facility] Medication [facility] [place] [place], [place] [address] [phone] --Must be a resident of [facility] -- Must have NO insurance coverage whatsoever (no Medicaid/ Medicare, etc.) -- The pt. MUST have a primary care doctor that directs their care regularly and follows them in the community   MedAssist  [phone]   [employer]  [phone]    Agencies that provide inexpensive medical care: Organization         Address  Phone   Notes  [place]  [phone]   [facility]    [phone]   [facility] [place],  [address] [phone]   [place] [address] [place] [phone]   Planned Parenthood    (  [phone]   [facility]    [phone]   [facility]  [address], [place] Phone:  [phone], Fax:  [phone] Hours of Operation:  9 am - 6 pm, M-F.  Also accepts Medicaid/Medicare and self-pay.  [facility] for [place], [address], [place] Phone: [phone], Fax: [phone]. Hours of Operation:  8:30 am - 5:30 pm, M-F.  Also accepts Medicaid and self-pay.  [facility] [place] [address], [place] Phone: [phone]   [place], [place], [place] [phone], Ext. [phone] Mondays & Thursdays: 7-9 AM.  First 15 patients are seen on a first come, first serve basis.    [place] Providers:  Organization         Address  Phone   Notes  [facility] [address], [place] [phone] Also accepts self-pay patients.  [facility] [address], [place]  [phone]   [place], [address], [place] [phone]   [facility] [address], [place] [phone]   Lucianne Lei [address], [place]   [phone] Only accepts [place] Access [place] patients after they have their name applied to their card.   Self-Pay (no insurance) in [facility]:  Organization         Address  Phone   Notes  Sickle Cell Patients, [facility] [place] [phone]   [facility] [place] [phone]   [facility] [place]  [place], [place],  [phone]   [facility]/Dr. Osei-Bonsu  [address] [place] or [address], [place] [phone] Phone number for both [place] and [place] locations is the same.  [facility] [address] [place] [phone]   [facility] [address] [place] or [address] [phone]   [facility] [address] [place] [phone], phone; [phone], fax Sees patients 1st and 3rd Saturday of every month.  Must not qualify for public or private insurance (i.e. Medicaid, Medicare, [place] Health Choice, Veterans' Benefits)  Household income should be no more than 200% of the poverty level The clinic cannot treat you if  you are pregnant or think you are pregnant  Sexually transmitted diseases are not treated at the clinic.    Dental Care: Organization         Address  Phone  Notes  [facility] Department of [facility] [phone] Accepts children up to age 1 who are enrolled in [place] or [place]; pregnant women with a Medicaid card; and children who have applied for Medicaid or [place] Health Choice, but were declined, whose parents can pay a reduced fee at time of service.  [facility] Department of [facility]  [address], [place] [phone] Accepts children up to age 9 who are enrolled in [place] or [place]; pregnant women with a Medicaid card; and children who have applied for Medicaid or [place], but were declined, whose parents can pay a  reduced fee at time of service.  [place] Adult [facility] Access PRO[facility]  [place] [phone] Patients are seen by appointment only. Walk-ins are not accepted. [place] will see patients 86 years of age and older. Monday - Tuesday (8am-5pm) Most Wednesdays (8:30-5pm) $30 per visit, cash only  [facility] Adult [facility] Access PROGRAM  [address], [facility] [phone] Patients are seen by appointment only. Walk-ins are not accepted. [place] will see patients 74 years of age and older. One Wednesday Evening (Monthly: Volunteer Based).  $30 per visit, cash only  [place]  [phone] for adults; Children under age 58, call Graduate Pediatric Dentistry at [phone]. Children aged 30-14, please call [phone] to request a pediatric application.  Dental services are provided in all areas of dental care including fillings, crowns and bridges, complete and partial dentures, implants, gum treatment, root canals, and extractions. Preventive care is also provided. Treatment is  provided to both adults and children. Patients are selected via a lottery and there is often a waiting list.   [facility] [address] [place]  [phone] [email or web address]   [facility] [address] [place], [place] [phone], Ext. [phone] Second and Fourth Thursday of each month, opens at 6:30 AM; Clinic ends at 9 AM.  Patients are seen on a first-come first-served basis, and a limited number are seen during each clinic.   [facility]  [address] Hillard Danker [place], [place] [phone]   Eligibility Requirements You must have lived in [place], [place], or [place] counties for at least the last three months.   You cannot be eligible for state or federal sponsored [employer], including [employer], [place], or [employer].   You generally cannot be eligible for healthcare insurance through your employer.    How to apply: Eligibility screenings are held every Tuesday and Wednesday afternoon from 1:00 pm until 4:00 pm. You do not need an appointment for the interview!  [facility] [address], [place], [place]   [place]  [place] Department  [place]  [phone]    Behavioral Health Resources in the Community: Intensive Outpatient Programs Organization         Address  Phone  Notes  [place]. [address], [place], [place] [phone]   [facility] Outpatient [address] [place], [place]   ADS: Alcohol & Drug Svcs [address], [place], [place]   [place] [address]  [place], [place] or [phone]   Substance Abuse Resources Organization         Address  Phone  Notes  Alcohol and Drug Services  [phone]   [place]  [phone]   The  [place]   Chinita Pester  [phone]   Residential & Outpatient Substance Abuse Program  [phone]   Psychological Services Organization         Address  Phone  Notes  [facility] [place]  [place]  [phone]   [place] [address] [place] or [phone]    Mobile Crisis Teams Organization         Address  Phone  Notes  [facility]atives, Mobile Crisis Care Unit  [phone]  [facility]ive Psychotherapeutic Services  [address] [place], [place]   [facility] [address] [place] [phone]    Self-Help/Support Groups Organization         Address  Phone             Notes  [facility] [place] - variety of support groups  [place] Call for more information  Narcotics Anonymous (NA), [facility] [address], [employer] [place]  2 meetings at this location   Special educational needs teacher         Address  Phone  Notes  [facility]idential [facility] [place],    [place]  [phone]   [facility]  [address], [place] [phone], [place], [place]   [place], [place] [phone] Admissions: 8am-3pm M-F  Incentives Substance [place] [address],    [place], [place] [phone]   [facility] [address] [place], [place], [place]   [facility] [address],  [place], [place]   [facility] Programs - Intensive Outpatient [address] Kristeen [address], [place], [place]   [facility] [address].,  [place], [place] [phone] or [phone]   [facility] Services (RTS) [address], [place], [place] Accepts Medicaid  [facility] [place] [address]  [place] [phone] Substance Abuse/Addiction Treatment     [facility] Organization         Address  Phone  Notes  [facility]  [phone]   Domenic Schwab, PhD [address] Arlis Porta [place], [place]   [phone] or [phone]   [place], [place] [phone]   [facility] [address] [place], [place] [phone] Insurance/Medicaid/sponsorship through [facility] and Families [address]                                    [place], [place] [phone] [place] [address], [place] [phone]    Dr. Adele Schilder  [phone]   [facility] Dept. 1) [address], [place] 2) [place] 3)  [address], [place] [phone]  [phone]   [place] [phone] or [phone] (After Hours)

## 2015-07-12 NOTE — ED Notes (Signed)
Pt from home for eval of lower abd pain with radiation to left side of groin, pt states has had abd surgery in 2010 for ulcers and now is having pain again. Pt states sometimes a "knot appears on my groin." denies any n/v/d. Pt also reports abscess to right middle finger that he needs to have drained. nad noted.

## 2015-10-27 ENCOUNTER — Ambulatory Visit: Payer: Self-pay

## 2015-11-03 ENCOUNTER — Ambulatory Visit: Payer: Self-pay | Attending: Internal Medicine

## 2015-11-15 ENCOUNTER — Encounter: Payer: Self-pay | Admitting: Family Medicine

## 2015-11-15 ENCOUNTER — Other Ambulatory Visit: Payer: Self-pay | Admitting: Family Medicine

## 2015-11-15 ENCOUNTER — Ambulatory Visit: Payer: Self-pay | Attending: Family Medicine | Admitting: Family Medicine

## 2015-11-15 ENCOUNTER — Encounter: Payer: Self-pay | Admitting: Clinical

## 2015-11-15 VITALS — BP 115/74 | HR 80 | Temp 99.1°F | Resp 16 | Ht 72.0 in | Wt 128.0 lb

## 2015-11-15 DIAGNOSIS — Z5900 Homelessness unspecified: Secondary | ICD-10-CM | POA: Insufficient documentation

## 2015-11-15 DIAGNOSIS — E559 Vitamin D deficiency, unspecified: Secondary | ICD-10-CM

## 2015-11-15 DIAGNOSIS — R109 Unspecified abdominal pain: Secondary | ICD-10-CM | POA: Insufficient documentation

## 2015-11-15 DIAGNOSIS — F1099 Alcohol use, unspecified with unspecified alcohol-induced disorder: Secondary | ICD-10-CM

## 2015-11-15 DIAGNOSIS — Z0000 Encounter for general adult medical examination without abnormal findings: Secondary | ICD-10-CM

## 2015-11-15 DIAGNOSIS — Z114 Encounter for screening for human immunodeficiency virus [HIV]: Secondary | ICD-10-CM | POA: Insufficient documentation

## 2015-11-15 DIAGNOSIS — G43909 Migraine, unspecified, not intractable, without status migrainosus: Secondary | ICD-10-CM | POA: Insufficient documentation

## 2015-11-15 DIAGNOSIS — N50812 Left testicular pain: Secondary | ICD-10-CM | POA: Insufficient documentation

## 2015-11-15 DIAGNOSIS — Z23 Encounter for immunization: Secondary | ICD-10-CM | POA: Insufficient documentation

## 2015-11-15 DIAGNOSIS — IMO0002 Reserved for concepts with insufficient information to code with codable children: Secondary | ICD-10-CM | POA: Insufficient documentation

## 2015-11-15 DIAGNOSIS — N50819 Testicular pain, unspecified: Secondary | ICD-10-CM | POA: Insufficient documentation

## 2015-11-15 DIAGNOSIS — F17200 Nicotine dependence, unspecified, uncomplicated: Secondary | ICD-10-CM | POA: Insufficient documentation

## 2015-11-15 DIAGNOSIS — G43709 Chronic migraine without aura, not intractable, without status migrainosus: Secondary | ICD-10-CM

## 2015-11-15 DIAGNOSIS — Z720 Tobacco use: Secondary | ICD-10-CM

## 2015-11-15 DIAGNOSIS — R51 Headache: Secondary | ICD-10-CM | POA: Insufficient documentation

## 2015-11-15 DIAGNOSIS — Z1159 Encounter for screening for other viral diseases: Secondary | ICD-10-CM

## 2015-11-15 DIAGNOSIS — Z79899 Other long term (current) drug therapy: Secondary | ICD-10-CM | POA: Insufficient documentation

## 2015-11-15 DIAGNOSIS — F17210 Nicotine dependence, cigarettes, uncomplicated: Secondary | ICD-10-CM | POA: Insufficient documentation

## 2015-11-15 DIAGNOSIS — L03012 Cellulitis of left finger: Secondary | ICD-10-CM | POA: Insufficient documentation

## 2015-11-15 LAB — CBC
HEMATOCRIT: 45.6 % (ref 39.0–52.0)
HEMOGLOBIN: 15.5 g/dL (ref 13.0–17.0)
MCH: 35.3 pg — AB (ref 26.0–34.0)
MCHC: 34 g/dL (ref 30.0–36.0)
MCV: 103.9 fL — AB (ref 78.0–100.0)
MPV: 9.5 fL (ref 8.6–12.4)
PLATELETS: 322 10*3/uL (ref 150–400)
RBC: 4.39 MIL/uL (ref 4.22–5.81)
RDW: 13.9 % (ref 11.5–15.5)
WBC: 5.8 10*3/uL (ref 4.0–10.5)

## 2015-11-15 LAB — POCT GLYCOSYLATED HEMOGLOBIN (HGB A1C): Hemoglobin A1C: 5.1

## 2015-11-15 MED ORDER — BETAMETHASONE VALERATE 0.1 % EX OINT: 1.0000 "application " | TOPICAL_OINTMENT | Freq: Two times a day (BID) | CUTANEOUS | Status: DC

## 2015-11-15 MED ORDER — AMOXICILLIN-POT CLAVULANATE 875-125 MG PO TABS: 1.0000 | ORAL_TABLET | Freq: Two times a day (BID) | ORAL | Status: DC

## 2015-11-15 MED ORDER — SUMATRIPTAN SUCCINATE 25 MG PO TABS: 25.0000 mg | ORAL_TABLET | ORAL | Status: DC | PRN

## 2015-11-15 NOTE — Progress Notes (Signed)
Establish care  C/C HA, abdominal pain due to past surgery  Possible Fungal on rt hand x 1 year  Finger swelling and pain no hx injury  Pain scale #8 Tobacco user- 6-7 cigarette per day

## 2015-11-15 NOTE — Assessment & Plan Note (Signed)
A: tender testes on exam b/l with no hernia P: Testicular US

## 2015-11-15 NOTE — Progress Notes (Signed)
Depression screen PHQ 2/9 11/15/2015  Decreased Interest 3  Down, Depressed, Hopeless 1  PHQ - 2 Score 4  Altered sleeping 1  Tired, decreased energy 1  Change in appetite 1  Feeling bad or failure about yourself  1  Trouble concentrating 1  Moving slowly or fidgety/restless 0  Suicidal thoughts 0  PHQ-9 Score 9    GAD 7 : Generalized Anxiety Score 11/15/2015  Control/stop worrying 1  Worry too much - different things 1  Trouble relaxing 1  Restless 0  Easily annoyed or irritable 0  Afraid - awful might happen 0

## 2015-11-15 NOTE — Patient Instructions (Addendum)
Travis Lutz was seen today for headache, nail problem and abdominal pain.  Diagnoses and all orders for this visit:  Healthcare maintenance -     HgB A1c -     Flu Vaccine QUAD 36+ mos IM  Screening for HIV (human immunodeficiency virus) -     HIV antibody (with reflex)  Need for hepatitis C screening test -     Hepatitis C antibody, reflex  Chronic migraine without aura without status migrainosus, not intractable -     COMPLETE METABOLIC PANEL WITH GFR -     CBC -     TSH -     Vitamin D, 25-hydroxy -     Vitamin B12 -     CT Head Wo Contrast; Future -     SUMAtriptan (IMITREX) 25 MG tablet; Take 1 tablet (25 mg total) by mouth every 2 (two) hours as needed for migraine. May repeat in 2 hours if headache persists or recurs.  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   F/u in 2-3 weeks for headaches and sharp foot pains, next step of chronic paronychia treatment  Dr. Adrian Blackwater

## 2015-11-15 NOTE — Assessment & Plan Note (Signed)
A: chronic paronychia P: Augmentin and topical steroid

## 2015-11-15 NOTE — Assessment & Plan Note (Signed)
A: headaches consistent with migraines P: CT head imitrex
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
## 2015-11-16 DIAGNOSIS — E559 Vitamin D deficiency, unspecified: Secondary | ICD-10-CM | POA: Insufficient documentation

## 2015-11-16 LAB — HEPATITIS C ANTIBODY: HCV AB: NEGATIVE

## 2015-11-16 LAB — COMPLETE METABOLIC PANEL WITH GFR
ALT: 20 U/L (ref 9–46)
AST: 24 U/L (ref 10–35)
Albumin: 4.9 g/dL (ref 3.6–5.1)
Alkaline Phosphatase: 83 U/L (ref 40–115)
BUN: 11 mg/dL (ref 7–25)
CALCIUM: 9.8 mg/dL (ref 8.6–10.3)
CHLORIDE: 102 mmol/L (ref 98–110)
CO2: 24 mmol/L (ref 20–31)
CREATININE: 0.99 mg/dL (ref 0.70–1.33)
GFR, Est African American: >89 mL/min (ref >=60)
GFR, Est Non African American: 84 mL/min (ref >=60)
Glucose, Bld: 95 mg/dL (ref 65–99)
POTASSIUM: 4.6 mmol/L (ref 3.5–5.3)
Sodium: 143 mmol/L (ref 135–146)
Total Bilirubin: 0.5 mg/dL (ref 0.2–1.2)
Total Protein: 8.8 g/dL — ABNORMAL HIGH (ref 6.1–8.1)

## 2015-11-16 LAB — VITAMIN B12: Vitamin B-12: 557 pg/mL (ref 200–1100)

## 2015-11-16 LAB — VITAMIN D 25 HYDROXY (VIT D DEFICIENCY, FRACTURES): Vit D, 25-Hydroxy: 6 ng/mL — ABNORMAL LOW (ref 30–100)

## 2015-11-16 LAB — TSH: TSH: 0.7 m[IU]/L (ref 0.40–4.50)

## 2015-11-16 LAB — HIV ANTIBODY (ROUTINE TESTING W REFLEX): HIV: NONREACTIVE

## 2015-11-16 MED ORDER — VITAMIN D (ERGOCALCIFEROL) 1.25 MG (50000 UNIT) PO CAPS: 50000.0000 [IU] | ORAL_CAPSULE | ORAL | Status: DC

## 2015-11-16 MED FILL — VIT D2 1.25 MG (50,000 UNIT: 1.25 MG | 84 days supply | Qty: 12 | Fill #0

## 2015-11-16 MED FILL — BETAMETHASONE VALER 0.1% OI: 0.1 | 14 days supply | Qty: 45 | Fill #0

## 2015-11-16 MED FILL — SUMATRIPTAN SUCC 25 MG TAB: 25 | 30 days supply | Qty: 10 | Fill #0

## 2015-11-16 MED FILL — AMOX-CLAV 875-125 MG TABLET: 875-125 | 10 days supply | Qty: 20 | Fill #0

## 2015-11-16 NOTE — Addendum Note (Signed)
Addended by: Boykin Nearing on: 11/16/2015 08:36 AM   Modules accepted: Orders

## 2015-11-17 ENCOUNTER — Telehealth: Payer: Self-pay | Admitting: *Deleted

## 2015-11-17 NOTE — Telephone Encounter (Signed)
LVM to return call   CT head appointment on March 14,2017 at 12:45 arriving 15 min early  [facility] radiology

## 2015-11-17 NOTE — Telephone Encounter (Signed)
-----   Message from Boykin Nearing, MD sent at 11/16/2015  8:34 AM EST ----- Vit D is severely low treated with 12 weeks of 50 K U D3, sent to pharmacy  all other labs normal

## 2015-11-23 ENCOUNTER — Ambulatory Visit (HOSPITAL_COMMUNITY): Admission: RE | Admit: 2015-11-23 | Payer: Self-pay | Source: Ambulatory Visit

## 2015-12-06 ENCOUNTER — Encounter: Payer: Self-pay | Admitting: Family Medicine

## 2015-12-06 ENCOUNTER — Ambulatory Visit: Payer: Self-pay | Attending: Family Medicine | Admitting: Family Medicine

## 2015-12-06 VITALS — BP 102/53 | HR 87 | Temp 98.2°F | Resp 18 | Ht 69.0 in | Wt 128.0 lb

## 2015-12-06 DIAGNOSIS — F17210 Nicotine dependence, cigarettes, uncomplicated: Secondary | ICD-10-CM | POA: Insufficient documentation

## 2015-12-06 DIAGNOSIS — N50819 Testicular pain, unspecified: Secondary | ICD-10-CM | POA: Insufficient documentation

## 2015-12-06 DIAGNOSIS — M79671 Pain in right foot: Secondary | ICD-10-CM

## 2015-12-06 DIAGNOSIS — M545 Low back pain, unspecified: Secondary | ICD-10-CM

## 2015-12-06 DIAGNOSIS — G8929 Other chronic pain: Secondary | ICD-10-CM

## 2015-12-06 DIAGNOSIS — M25561 Pain in right knee: Secondary | ICD-10-CM

## 2015-12-06 DIAGNOSIS — E559 Vitamin D deficiency, unspecified: Secondary | ICD-10-CM

## 2015-12-06 DIAGNOSIS — L03012 Cellulitis of left finger: Secondary | ICD-10-CM

## 2015-12-06 DIAGNOSIS — Z0000 Encounter for general adult medical examination without abnormal findings: Secondary | ICD-10-CM

## 2015-12-06 DIAGNOSIS — R110 Nausea: Secondary | ICD-10-CM

## 2015-12-06 DIAGNOSIS — M25562 Pain in left knee: Secondary | ICD-10-CM | POA: Insufficient documentation

## 2015-12-06 DIAGNOSIS — R109 Unspecified abdominal pain: Secondary | ICD-10-CM | POA: Insufficient documentation

## 2015-12-06 DIAGNOSIS — M79672 Pain in left foot: Secondary | ICD-10-CM | POA: Insufficient documentation

## 2015-12-06 DIAGNOSIS — Z79899 Other long term (current) drug therapy: Secondary | ICD-10-CM | POA: Insufficient documentation

## 2015-12-06 LAB — GLUCOSE, POCT (MANUAL RESULT ENTRY): POC GLUCOSE: 136 mg/dL — AB (ref 70–99)

## 2015-12-06 MED ORDER — TERBINAFINE HCL 250 MG PO TABS: 250.0000 mg | ORAL_TABLET | Freq: Every day | ORAL | Status: DC

## 2015-12-06 MED ORDER — TRAMADOL HCL 50 MG PO TABS: 50.0000 mg | ORAL_TABLET | Freq: Two times a day (BID) | ORAL | Status: DC | PRN

## 2015-12-06 MED FILL — METHYLPREDNISOLONE 4 MG TAB: 4 | 6 days supply | Qty: 21 | Fill #0

## 2015-12-06 MED FILL — traMADol HCL 50 MG TABS: 50 | 30 days supply | Qty: 60 | Fill #0

## 2015-12-06 MED FILL — TERBINAFINE HCL 250 MG TAB: 250 | 30 days supply | Qty: 30 | Fill #0

## 2015-12-06 MED FILL — METHOCARBAMOL 500 MG TABLET: 500 | 30 days supply | Qty: 60 | Fill #0

## 2015-12-06 NOTE — Patient Instructions (Addendum)
Travis Lutz was seen today for follow-up.  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  Nausea -     Glucose (CBG) -     Cancel: Glucose (CBG)  Chronic low back pain -     traMADol (ULTRAM) 50 MG tablet; Take 1 tablet (50 mg total) by mouth every 12 (twelve) hours as needed.  Bilateral chronic knee pain -     traMADol (ULTRAM) 50 MG tablet; Take 1 tablet (50 mg total) by mouth every 12 (twelve) hours as needed.   F/u in 3 months for chronic paronychia   Dr. Adrian Blackwater  Vitamin D Deficiency Vitamin D deficiency is when your body does not have enough vitamin D. Vitamin D is important to your body for many reasons:  It helps the body to absorb two important minerals, called calcium and phosphorus.  It plays a role in bone health.  It may help to prevent some diseases, such as diabetes and multiple sclerosis.  It plays a role in muscle function, including heart function. You can get vitamin D by:  Eating foods that naturally contain vitamin D.  Eating or drinking milk or other dairy products that have vitamin D added to them.  Taking a vitamin D supplement or a multivitamin supplement that contains vitamin D.  Being in the sun. Your body naturally makes vitamin D when your skin is exposed to sunlight. Your body changes the sunlight into a form of the vitamin that the body can use. If vitamin D deficiency is severe, it can cause a condition in which your bones become soft. In adults, this condition is called osteomalacia. In children, this condition is called rickets. CAUSES Vitamin D deficiency may be caused by:  Not eating enough foods that contain vitamin D.  Not getting enough sun exposure.  Having certain digestive system diseases that make it difficult for your body to absorb vitamin D. These diseases include Crohn disease, chronic pancreatitis, and  cystic fibrosis.  Having a surgery in which a part of the stomach or a part of the small intestine is removed.  Being obese.  Having chronic kidney disease or liver disease. RISK FACTORS This condition is more likely to develop in:  Older people.  People who do not spend much time outdoors.  People who live in a long-term care facility.  People who have had broken bones.  People with weak or thin bones (osteoporosis).  People who have a disease or condition that changes how the body absorbs vitamin D.  People who have dark skin.  People who take certain medicines, such as steroid medicines or certain seizure medicines.  People who are overweight or obese. SYMPTOMS In mild cases of vitamin D deficiency, there may not be any symptoms. If the condition is severe, symptoms may include:  Bone pain.  Muscle pain.  Falling often.  Broken bones caused by a minor injury. DIAGNOSIS This condition is usually diagnosed with a blood test.  TREATMENT Treatment for this condition may depend on what caused the condition. Treatment options include:  Taking vitamin D supplements.  Taking a calcium supplement. Your health care provider will suggest what dose is best for you. HOME CARE INSTRUCTIONS  Take medicines and supplements only as told by your health care provider.  Eat foods that contain vitamin D. Choices include:  Fortified dairy products, cereals,  or juices. Fortified means that vitamin D has been added to the food. Check the label on the package to be sure.  Fatty fish, such as salmon or trout.  Eggs.  Oysters.  Do not use a tanning bed.  Maintain a healthy weight. Lose weight, if needed.  Keep all follow-up visits as told by your health care provider. This is important. SEEK MEDICAL CARE IF:  Your symptoms do not go away.  You feel like throwing up (nausea) or you throw up (vomit).  You have fewer bowel movements than usual or it is difficult for you to  have a bowel movement (constipation).   This information is not intended to replace advice given to you by your health care provider. Make sure you discuss any questions you have with your health care provider.   Document Released: 11/20/2011 Document Revised: 05/19/2015 Document Reviewed: 01/13/2015 Elsevier Interactive Patient Education [employer].

## 2015-12-06 NOTE — Assessment & Plan Note (Signed)
A; chronic paronychia, normal LFTs P: lamisil

## 2015-12-06 NOTE — Progress Notes (Signed)
Patient is here for HA and bilateral foot pain  Patient complains of bilateral foot pain currently and HA. Pain is scaled currently at an 8.  Patient complains of N/V for the past two days. Patient has not taken any medications for these symptoms.  Patient states the valisone is not providing any relief.
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
## 2015-12-07 ENCOUNTER — Encounter: Payer: Self-pay | Admitting: Clinical

## 2015-12-07 NOTE — Assessment & Plan Note (Signed)
A: vit D def with MSK pains P: Replace vit D

## 2015-12-07 NOTE — Progress Notes (Signed)
Depression screen [facility] 2/9 12/06/2015 11/15/2015  Decreased Interest 1 3  Down, Depressed, Hopeless 1 1  PHQ - 2 Score 2 4  Altered sleeping 1 1  Tired, decreased energy 1 1  Change in appetite 1 1  Feeling bad or failure about yourself  1 1  Trouble concentrating - 1  Moving slowly or fidgety/restless - 0  Suicidal thoughts - 0  PHQ-9 Score 6 9    GAD 7 : Generalized Anxiety Score 11/15/2015  Control/stop worrying 1  Worry too much - different things 1  Trouble relaxing 1  Restless 0  Easily annoyed or irritable 0  Afraid - awful might happen 0

## 2015-12-08 ENCOUNTER — Encounter (HOSPITAL_COMMUNITY): Payer: Self-pay | Admitting: *Deleted

## 2015-12-08 ENCOUNTER — Emergency Department (HOSPITAL_COMMUNITY)
Admission: EM | Admit: 2015-12-08 | Discharge: 2015-12-08 | Disposition: A | Discharge status: Home / Self Care | Payer: Self-pay | Attending: Emergency Medicine | Admitting: Emergency Medicine

## 2015-12-08 ENCOUNTER — Emergency Department (HOSPITAL_COMMUNITY): Payer: Self-pay

## 2015-12-08 DIAGNOSIS — F17210 Nicotine dependence, cigarettes, uncomplicated: Secondary | ICD-10-CM | POA: Insufficient documentation

## 2015-12-08 DIAGNOSIS — Z79899 Other long term (current) drug therapy: Secondary | ICD-10-CM | POA: Insufficient documentation

## 2015-12-08 DIAGNOSIS — R630 Anorexia: Secondary | ICD-10-CM | POA: Insufficient documentation

## 2015-12-08 DIAGNOSIS — J159 Unspecified bacterial pneumonia: Secondary | ICD-10-CM | POA: Insufficient documentation

## 2015-12-08 DIAGNOSIS — Z7952 Long term (current) use of systemic steroids: Secondary | ICD-10-CM | POA: Insufficient documentation

## 2015-12-08 DIAGNOSIS — J189 Pneumonia, unspecified organism: Secondary | ICD-10-CM

## 2015-12-08 MED ORDER — ALBUTEROL SULFATE HFA 108 (90 BASE) MCG/ACT IN AERS
2.0000 | INHALATION_SPRAY | Freq: Once | RESPIRATORY_TRACT | Status: AC
  Administered 2015-12-08: 2 via RESPIRATORY_TRACT
  Filled 2015-12-08: qty 6.7

## 2015-12-08 MED ORDER — CEFTRIAXONE SODIUM 1 G IJ SOLR
1.0000 g | Freq: Once | INTRAMUSCULAR | Status: AC
  Administered 2015-12-08: 1 g via INTRAMUSCULAR
  Filled 2015-12-08: qty 10

## 2015-12-08 MED ORDER — AZITHROMYCIN 250 MG PO TABS
500.0000 mg | ORAL_TABLET | Freq: Once | ORAL | Status: AC
  Administered 2015-12-08: 500 mg via ORAL
  Filled 2015-12-08: qty 2

## 2015-12-08 MED ORDER — HYDROCODONE-ACETAMINOPHEN 5-325 MG PO TABS
2.0000 | ORAL_TABLET | Freq: Once | ORAL | Status: AC
  Administered 2015-12-08: 2 via ORAL
  Filled 2015-12-08: qty 2

## 2015-12-08 MED ORDER — AZITHROMYCIN 250 MG PO TABS: 250.0000 mg | ORAL_TABLET | Freq: Every day | ORAL | Status: DC

## 2015-12-08 MED ORDER — HYDROCODONE-ACETAMINOPHEN 5-325 MG PO TABS: 2.0000 | ORAL_TABLET | ORAL | Status: DC | PRN

## 2015-12-08 MED ORDER — ALBUTEROL SULFATE HFA 108 (90 BASE) MCG/ACT IN AERS: 1.0000 | INHALATION_SPRAY | Freq: Four times a day (QID) | RESPIRATORY_TRACT | Status: DC | PRN

## 2015-12-08 MED ORDER — IPRATROPIUM-ALBUTEROL 0.5-2.5 (3) MG/3ML IN SOLN
3.0000 mL | Freq: Once | RESPIRATORY_TRACT | Status: AC
  Administered 2015-12-08: 3 mL via RESPIRATORY_TRACT
  Filled 2015-12-08: qty 3

## 2015-12-08 NOTE — ED Notes (Signed)
The pt ic c/o a cough cold headache with head and chest congestion  Chills unknown temp.  Last tylenol was 2-3 days ago.Travis Lutz  He has had these symptoms for one week  He has pain all over his body

## 2015-12-08 NOTE — ED Provider Notes (Signed)
CSN: [id number]     Arrival date & time 12/08/15  1725 History  By signing my name below, I, Eustaquio Maize, attest that this documentation has been prepared under the direction and in the presence of Alyse Low, [place].  Electronically Signed: Eustaquio Maize, ED Scribe. 12/08/2015. 7:13 PM.   Chief Complaint  Patient presents with  . multiple complaints    The history is provided by the patient. No language interpreter was used.     HPI Comments: Travis Lutz is a 60 y.o. male who presents to the Emergency Department complaining of gradual onset, constant, substernal chest pain radiating to abdomen x 2-3 days. The chest pain is exacerbated with coughing. Pt also complains of loss of appetite, nausea, vomiting, chills, congestion, and body aches. He has has recent sick contact. No hx pneumonia, cardiac issues, or DM. Denies diarrhea, shortness of breath, or any other associated symptoms.   History reviewed. No pertinent past medical history. Past Surgical History  Procedure Laterality Date  . Repair of perforated ulcer     No family history on file. Social History  Substance Use Topics  . Smoking status: Current Every Day Smoker -- 0.50 packs/day    Types: Cigarettes  . Smokeless tobacco: None  . Alcohol Use: 0.0 oz/week    0 Standard drinks or equivalent per week     Comment: beer daily     Review of Systems  Constitutional: Positive for chills and appetite change.  HENT: Positive for congestion.   Respiratory: Positive for cough. Negative for shortness of breath.   Cardiovascular: Positive for chest pain.  Gastrointestinal: Positive for nausea and vomiting. Negative for diarrhea.  Musculoskeletal: Positive for myalgias.  All other systems reviewed and are negative.  Allergies  Review of patient's allergies indicates no known allergies.  Home Medications   Prior to Admission medications   Medication Sig Start Date End Date Taking? Authorizing Provider  acetaminophen  (TYLENOL) 500 MG tablet Take 1,000 mg by mouth every 6 (six) hours as needed for moderate pain.    Historical Provider, MD  betamethasone valerate ointment (VALISONE) 0.1 % Apply 1 application topically 2 (two) times daily. Apply to finger nail beds 11/15/15   Boykin Nearing, MD  SUMAtriptan (IMITREX) 25 MG tablet Take 1 tablet (25 mg total) by mouth every 2 (two) hours as needed for migraine. May repeat in 2 hours if headache persists or recurs. 11/15/15   Josalyn Funches, MD  terbinafine (LAMISIL) 250 MG tablet Take 1 tablet (250 mg total) by mouth daily. 12/06/15   Josalyn Funches, MD  traMADol (ULTRAM) 50 MG tablet Take 1 tablet (50 mg total) by mouth every 12 (twelve) hours as needed. 12/06/15   Josalyn Funches, MD  Vitamin D, Ergocalciferol, (DRISDOL) 50000 units CAPS capsule Take 1 capsule (50,000 Units total) by mouth every 7 (seven) days. For 12 weeks Patient not taking: Reported on 12/06/2015 11/16/15   Josalyn Funches, MD   BP 120/81 mmHg  Pulse 88  Temp(Src) 100.6 F (38.1 C)  Resp 18  Ht 5\' 9"  (1.753 m)  Wt 125 lb 4 oz (56.813 kg)  BMI 18.49 kg/m2  SpO2 100%   Physical Exam  Constitutional: He is oriented to person, place, and time. He appears well-developed and well-nourished. No distress.  HENT:  Head: Normocephalic and atraumatic.  Eyes: Conjunctivae and EOM are normal.  Neck: Neck supple. No tracheal deviation present.  Cardiovascular: Normal rate and regular rhythm.   Pulmonary/Chest: Effort normal. No respiratory distress. He  has wheezes.  Wheezing bilateral lower lobes.  Decreased breath sounds.   Musculoskeletal: Normal range of motion.  Neurological: He is alert and oriented to person, place, and time.  Skin: Skin is warm and dry.  Psychiatric: He has a normal mood and affect. His behavior is normal.  Nursing note and vitals reviewed.   ED Course  Procedures (including critical care time)  DIAGNOSTIC STUDIES: Oxygen Saturation is 100% on RA, normal by my  interpretation.    COORDINATION OF CARE: 7:42 PM-Discussed treatment plan which includes CXR with pt at bedside and pt agreed to plan.   Labs Review Labs Reviewed - No data to display  Imaging Review Dg Chest 2 View  12/08/2015  CLINICAL DATA:  Chest pain, chills, cough for 5 days EXAM: CHEST  2 VIEW COMPARISON:  12/04/2014 FINDINGS: Cardiomediastinal silhouette is stable. Mild hyperinflation again noted. There is streaky left base retrocardiac atelectasis or early infiltrate best seen on lateral view. No pulmonary edema. IMPRESSION: Mild hyperinflation. No pulmonary edema. Streaky left base retrocardiac atelectasis or infiltrate best seen on lateral view. Electronically Signed   By: Lahoma Crocker M.D.   On: 12/08/2015 20:19   I have personally reviewed and evaluated these images as part of my medical decision-making.   EKG Interpretation None      MDM Port score 11.  Pt given Rocephin, zithromax here.   rx for zithromax, albuterol and hydrocodone    Final diagnoses:  Community acquired pneumonia  An After Visit Summary was printed and given to the patient.  zithromax Hydrocodone Albuterol      Fransico Meadow, PA-C 12/08/15 2040  Davonna Belling, MD 12/09/15 0110

## 2015-12-08 NOTE — Discharge Instructions (Signed)

## 2015-12-09 MED FILL — !VENTOLIN HFA INHALER: 108 (90 BAS | 28 days supply | Qty: 18 | Fill #0

## 2015-12-09 MED FILL — ?AZITHROMYCIN 250 MG TABLET: 250 MG | 5 days supply | Qty: 6 | Fill #0

## 2015-12-09 MED FILL — HYDROCODON-APAP 5-325: 5-325 | 1 days supply | Qty: 10 | Fill #0

## 2016-02-06 ENCOUNTER — Ambulatory Visit (INDEPENDENT_AMBULATORY_CARE_PROVIDER_SITE_OTHER): Payer: Self-pay

## 2016-02-06 ENCOUNTER — Ambulatory Visit (HOSPITAL_COMMUNITY)
Admission: EM | Admit: 2016-02-06 | Discharge: 2016-02-06 | Disposition: A | Discharge status: Home / Self Care | Payer: Self-pay | Attending: Emergency Medicine | Admitting: Emergency Medicine

## 2016-02-06 ENCOUNTER — Encounter (HOSPITAL_COMMUNITY): Payer: Self-pay | Admitting: Emergency Medicine

## 2016-02-06 DIAGNOSIS — R0781 Pleurodynia: Secondary | ICD-10-CM

## 2016-02-06 MED ORDER — INDOMETHACIN 50 MG PO CAPS: 50.0000 mg | ORAL_CAPSULE | Freq: Two times a day (BID) | ORAL | Status: DC

## 2016-02-06 MED ORDER — KETOROLAC TROMETHAMINE 60 MG/2ML IM SOLN
INTRAMUSCULAR | Status: AC
  Filled 2016-02-06: qty 2

## 2016-02-06 MED ORDER — KETOROLAC TROMETHAMINE 60 MG/2ML IM SOLN
60.0000 mg | Freq: Once | INTRAMUSCULAR | Status: AC
  Administered 2016-02-06: 60 mg via INTRAMUSCULAR

## 2016-02-06 MED ORDER — HYDROCODONE-ACETAMINOPHEN 5-325 MG PO TABS: 1.0000 | ORAL_TABLET | ORAL | Status: DC | PRN

## 2016-02-06 NOTE — ED Provider Notes (Signed)
CSN: [id number]     Arrival date & time 02/06/16  1346 History   First MD Initiated Contact with Patient 02/06/16 1409     Chief Complaint  Patient presents with  . Pleurisy   (Consider location/radiation/quality/duration/timing/severity/associated sxs/prior Treatment) HPI History obtained from patient:  Pt presents with the cc of:  Right upper chest pain Duration of symptoms: 2 weeks Treatment prior to arrival: None really Context: Onset of discomfort after having pneumonia on the right side treated with antibiotics. Other symptoms include: Hurts to take a deep breath Pain score: 3 FAMILY HISTORY: History of myocardial infarction in the family.    History reviewed. No pertinent past medical history. Past Surgical History  Procedure Laterality Date  . Repair of perforated ulcer     History reviewed. No pertinent family history. Social History  Substance Use Topics  . Smoking status: Current Every Day Smoker -- 0.50 packs/day    Types: Cigarettes  . Smokeless tobacco: None  . Alcohol Use: 0.0 oz/week    0 Standard drinks or equivalent per week     Comment: beer daily     Review of Systems  Denies: HEADACHE, NAUSEA, ABDOMINAL PAIN,  CONGESTION, DYSURIA, SHORTNESS OF BREATH  Allergies  Review of patient's allergies indicates no known allergies.  Home Medications   Prior to Admission medications   Medication Sig Start Date End Date Taking? Authorizing Provider  acetaminophen (TYLENOL) 500 MG tablet Take 1,000 mg by mouth every 6 (six) hours as needed for moderate pain.   Yes Historical Provider, MD  albuterol (PROVENTIL HFA;VENTOLIN HFA) 108 (90 Base) MCG/ACT inhaler Inhale 1-2 puffs into the lungs every 6 (six) hours as needed for wheezing or shortness of breath. 12/08/15   Fransico Meadow, PA-C  azithromycin (ZITHROMAX) 250 MG tablet Take 1 tablet (250 mg total) by mouth daily. Take first 2 tablets together, then 1 every day until finished. 12/08/15   Fransico Meadow, PA-C   betamethasone valerate ointment (VALISONE) 0.1 % Apply 1 application topically 2 (two) times daily. Apply to finger nail beds 11/15/15   Boykin Nearing, MD  HYDROcodone-acetaminophen (NORCO/VICODIN) 5-325 MG tablet Take 2 tablets by mouth every 4 (four) hours as needed. 12/08/15   Fransico Meadow, PA-C  SUMAtriptan (IMITREX) 25 MG tablet Take 1 tablet (25 mg total) by mouth every 2 (two) hours as needed for migraine. May repeat in 2 hours if headache persists or recurs. 11/15/15   Josalyn Funches, MD  terbinafine (LAMISIL) 250 MG tablet Take 1 tablet (250 mg total) by mouth daily. 12/06/15   Josalyn Funches, MD  traMADol (ULTRAM) 50 MG tablet Take 1 tablet (50 mg total) by mouth every 12 (twelve) hours as needed. 12/06/15   Josalyn Funches, MD  Vitamin D, Ergocalciferol, (DRISDOL) 50000 units CAPS capsule Take 1 capsule (50,000 Units total) by mouth every 7 (seven) days. For 12 weeks Patient not taking: Reported on 12/06/2015 11/16/15   Boykin Nearing, MD   Meds Ordered and Administered this Visit   Medications  ketorolac (TORADOL) injection 60 mg (60 mg Intramuscular Given 02/06/16 1433)    BP 157/87 mmHg  Pulse 71  Temp(Src) 99 F (37.2 C) (Oral)  Resp 16  SpO2 100% No data found.   Physical Exam NURSES NOTES AND VITAL SIGNS REVIEWED. CONSTITUTIONAL: Well developed, well nourished, no acute distress HEENT: normocephalic, atraumatic EYES: Conjunctiva normal NECK:normal ROM, supple, no adenopathy PULMONARY:No respiratory distress, normal effort, There is right upper chest wall tenderness noted that is reproducible (patient states  that this is the same pain he is having at home) ABDOMINAL: Soft, ND, NT BS+, No CVAT MUSCULOSKELETAL: Normal ROM of all extremities,  SKIN: warm and dry without rash PSYCHIATRIC: Mood and affect, behavior are normal  ED Course  Procedures (including critical care time)  Labs Review Labs Reviewed - No data to display  Imaging Review Dg Chest 2  View  02/06/2016  CLINICAL DATA:  Patient with right-sided chest pain. History of pneumonia. EXAM: CHEST  2 VIEW COMPARISON:  Chest radiograph 12/08/2015. FINDINGS: Normal cardiac and mediastinal contours. No consolidative pulmonary opacities. No pleural effusion or pneumothorax. Pulmonary hyperinflation. Mid thoracic spine degenerative changes. IMPRESSION: Pulmonary hyperinflation.  No acute cardiopulmonary process. Electronically Signed   By: Lovey Newcomer M.D.   On: 02/06/2016 14:49   I HAVE PERSONALLY  REVIEWED AND DISCUSSED RESULTS OF  X-RAYS WITH PATIENT AND FAMILY PRIOR TO DISCHARGE.     Visual Acuity Review  Right Eye Distance:   Left Eye Distance:   Bilateral Distance:    Right Eye Near:   Left Eye Near:    Bilateral Near:       Prescriptions for indomethacin and hydrocodone.  MDM   1. Pleuritic chest pain     Patient is reassured that there are no issues that require transfer to higher level of care at this time or additional tests. Patient is advised to continue home symptomatic treatment. Patient is advised that if there are new or worsening symptoms to attend the emergency department, contact primary care provider, or return to UC. Instructions of care provided discharged home in stable condition.    THIS NOTE WAS GENERATED USING A VOICE RECOGNITION SOFTWARE PROGRAM. ALL REASONABLE EFFORTS  WERE MADE TO PROOFREAD THIS DOCUMENT FOR ACCURACY.  I have verbally reviewed the discharge instructions with the patient. A printed AVS was given to the patient.  All questions were answered prior to discharge.      Konrad Felix, PA 02/06/16 1501

## 2016-02-06 NOTE — Discharge Instructions (Signed)
Costochondritis Costochondritis is a condition in which the tissue (cartilage) that connects your ribs with your breastbone (sternum) becomes irritated. It causes pain in the chest and rib area. It usually goes away on its own over time. HOME CARE  Avoid activities that wear you out.  Do not strain your ribs. Avoid activities that use your:  Chest.  Belly.  Side muscles.  Put ice on the area for the first 2 days after the pain starts.  Put ice in a plastic bag.  Place a towel between your skin and the bag.  Leave the ice on for 20 minutes, 2-3 times a day.  Only take medicine as told by your doctor. GET HELP IF:  You have redness or puffiness (swelling) in the rib area.  Your pain does not go away with rest or medicine. GET HELP RIGHT AWAY IF:   Your pain gets worse.  You are very uncomfortable.  You have trouble breathing.  You cough up blood.  You start sweating or throwing up (vomiting).  You have a fever or lasting symptoms for more than 2-3 days.  You have a fever and your symptoms suddenly get worse. MAKE SURE YOU:   Understand these instructions.  Will watch your condition.  Will get help right away if you are not doing well or get worse.   This information is not intended to replace advice given to you by your health care provider. Make sure you discuss any questions you have with your health care provider.   Document Released: 02/14/2008 Document Revised: 04/30/2013 Document Reviewed: 04/01/2013 Elsevier Interactive Patient Education 2016 Elsevier Inc[employer]  Chest Wall Pain Chest wall pain is pain in or around the bones and muscles of your chest. Sometimes, an injury causes this pain. Sometimes, the cause may not be known. This pain may take several weeks or longer to get better. HOME CARE INSTRUCTIONS  Pay attention to any changes in your symptoms. Take these actions to help with your pain:   Rest as told by your health care provider.   Avoid  activities that cause pain. These include any activities that use your chest muscles or your abdominal and side muscles to lift heavy items.   If directed, apply ice to the painful area:  Put ice in a plastic bag.  Place a towel between your skin and the bag.  Leave the ice on for 20 minutes, 2-3 times per day.  Take over-the-counter and prescription medicines only as told by your health care provider.  Do not use tobacco products, including cigarettes, chewing tobacco, and e-cigarettes. If you need help quitting, ask your health care provider.  Keep all follow-up visits as told by your health care provider. This is important. SEEK MEDICAL CARE IF:  You have a fever.  Your chest pain becomes worse.  You have new symptoms. SEEK IMMEDIATE MEDICAL CARE IF:  You have nausea or vomiting.  You feel sweaty or light-headed.  You have a cough with phlegm (sputum) or you cough up blood.  You develop shortness of breath.   This information is not intended to replace advice given to you by your health care provider. Make sure you discuss any questions you have with your health care provider.   Document Released: 08/28/2005 Document Revised: 05/19/2015 Document Reviewed: 11/23/2014 Elsevier Interactive Patient Education [employer].

## 2016-02-06 NOTE — ED Notes (Addendum)
The patient presented to the [facility] with a complaint of chest pain that he described as a pressure in the center of his chest that does get worse with inspiration and is somewhat relieved when laying flat. The patient stated that nothing makes the pain worse or better except upon inspiration. The patient denied any cardiac history. The patient also did report being diagnosed with pneumonia this past march.

## 2016-02-08 MED FILL — HYDROCODON-APAP 5-325: 5-325 | 4 days supply | Qty: 20 | Fill #0

## 2016-02-08 MED FILL — ?INDOMETHACIN 50 MG CAPSULE: 50 | 10 days supply | Qty: 20 | Fill #0

## 2016-02-29 ENCOUNTER — Encounter: Payer: Self-pay | Admitting: Family Medicine

## 2016-02-29 ENCOUNTER — Ambulatory Visit: Payer: Self-pay | Attending: Family Medicine | Admitting: Family Medicine

## 2016-02-29 VITALS — BP 109/74 | HR 72 | Temp 98.2°F | Resp 16 | Ht 73.0 in | Wt 125.0 lb

## 2016-02-29 DIAGNOSIS — R079 Chest pain, unspecified: Secondary | ICD-10-CM | POA: Insufficient documentation

## 2016-02-29 DIAGNOSIS — Z720 Tobacco use: Secondary | ICD-10-CM

## 2016-02-29 DIAGNOSIS — F17210 Nicotine dependence, cigarettes, uncomplicated: Secondary | ICD-10-CM | POA: Insufficient documentation

## 2016-02-29 DIAGNOSIS — F17200 Nicotine dependence, unspecified, uncomplicated: Secondary | ICD-10-CM

## 2016-02-29 DIAGNOSIS — R0602 Shortness of breath: Secondary | ICD-10-CM | POA: Insufficient documentation

## 2016-02-29 DIAGNOSIS — R0600 Dyspnea, unspecified: Secondary | ICD-10-CM | POA: Insufficient documentation

## 2016-02-29 DIAGNOSIS — M25542 Pain in joints of left hand: Secondary | ICD-10-CM

## 2016-02-29 DIAGNOSIS — L03012 Cellulitis of left finger: Secondary | ICD-10-CM

## 2016-02-29 DIAGNOSIS — Z8701 Personal history of pneumonia (recurrent): Secondary | ICD-10-CM | POA: Insufficient documentation

## 2016-02-29 DIAGNOSIS — H1013 Acute atopic conjunctivitis, bilateral: Secondary | ICD-10-CM

## 2016-02-29 DIAGNOSIS — J309 Allergic rhinitis, unspecified: Secondary | ICD-10-CM

## 2016-02-29 DIAGNOSIS — Z09 Encounter for follow-up examination after completed treatment for conditions other than malignant neoplasm: Secondary | ICD-10-CM | POA: Insufficient documentation

## 2016-02-29 MED ORDER — FLUTICASONE-SALMETEROL 100-50 MCG/DOSE IN AEPB: 1.0000 | INHALATION_SPRAY | Freq: Two times a day (BID) | RESPIRATORY_TRACT | Status: DC

## 2016-02-29 MED ORDER — BETAMETHASONE VALERATE 0.1 % EX OINT: 1.0000 "application " | TOPICAL_OINTMENT | Freq: Two times a day (BID) | CUTANEOUS | Status: DC

## 2016-02-29 MED ORDER — NAPROXEN 500 MG PO TABS: 500.0000 mg | ORAL_TABLET | Freq: Two times a day (BID) | ORAL | Status: DC

## 2016-02-29 MED ORDER — KETOTIFEN FUMARATE 0.025 % OP SOLN: 1.0000 [drp] | Freq: Two times a day (BID) | OPHTHALMIC | Status: DC

## 2016-02-29 MED ORDER — BUPROPION HCL ER (XL) 150 MG PO TB24: 150.0000 mg | ORAL_TABLET | Freq: Every day | ORAL | Status: DC

## 2016-02-29 MED ORDER — FLUTICASONE PROPIONATE 50 MCG/ACT NA SUSP: 2.0000 | Freq: Every day | NASAL | Status: DC

## 2016-02-29 MED ORDER — ALBUTEROL SULFATE HFA 108 (90 BASE) MCG/ACT IN AERS: 1.0000 | INHALATION_SPRAY | Freq: Four times a day (QID) | RESPIRATORY_TRACT | Status: DC | PRN

## 2016-02-29 MED FILL — BUPROPION HCL XL 150 MG TAB: 150 | 30 days supply | Qty: 30 | Fill #0

## 2016-02-29 MED FILL — FLUTICASONE PROP 50 MCG SPR: 50 | 30 days supply | Qty: 16 | Fill #0

## 2016-02-29 MED FILL — !VENTOLIN HFA INHALER: 108 (90 BAS | 18 days supply | Qty: 18 | Fill #0

## 2016-02-29 MED FILL — SM EYE ITCH RELIEF 0.025% D: 0.025 | 20 days supply | Qty: 10 | Fill #0

## 2016-02-29 MED FILL — NAPROXEN 500 MG TABLET: 500 | 10 days supply | Qty: 30 | Fill #0

## 2016-02-29 MED FILL — **ADVAIR 100/50 DISKUS: 100-50 MCG | 21 days supply | Qty: 42 | Fill #0

## 2016-02-29 NOTE — Patient Instructions (Addendum)
Travis Lutz was seen today for pneumonia.  Diagnoses and all orders for this visit:  Smoker -     buPROPion (WELLBUTRIN XL) 150 MG 24 hr tablet; Take 1 tablet (150 mg total) by mouth daily.  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 -     betamethasone valerate ointment (VALISONE) 0.1 %; Apply 1 application topically 2 (two) times daily. Apply to finger nail beds  Dyspnea -     Fluticasone-Salmeterol (ADVAIR) 100-50 MCG/DOSE AEPB; Inhale 1 puff into the lungs 2 (two) times daily. -     CBC  Arthralgia of hand, left -     naproxen (NAPROSYN) 500 MG tablet; Take 1 tablet (500 mg total) by mouth 2 (two) times daily with a meal.  Allergic rhinoconjunctivitis of both eyes -     fluticasone (FLONASE) 50 MCG/ACT nasal spray; Place 2 sprays into both nostrils daily. -     ketotifen (ZADITOR) 0.025 % ophthalmic solution; Place 1 drop into both eyes 2 (two) times daily.   Smoking cessation support: smoking cessation hotline: 1-800-QUIT-NOW.  Smoking cessation classes are available through [facility]. Call [phone] or visit our website at [email or web address]   Please schedule an appointment with Dr. Melvyn Novas, pulmonology for dyspnea, suspected COPD  Please f/u with me in 6 weeks  Dr. Adrian Blackwater   Chronic Obstructive Pulmonary Disease Chronic obstructive pulmonary disease (COPD) is a common lung problem. In COPD, the flow of air from the lungs is limited. The way your lungs work will probably never return to normal, but there are things you can do to improve your lungs and make yourself feel better. Your doctor may treat your condition with:  Medicines.  Oxygen.  Lung surgery.  Changes to your diet.  Rehabilitation. This may involve a team of specialists. HOME CARE  Take all medicines as  told by your doctor.  Avoid medicines or cough syrups that dry up your airway (such as antihistamines) and do not allow you to get rid of thick spit. You do not need to avoid them if told differently by your doctor.  If you smoke, stop. Smoking makes the problem worse.  Avoid being around things that make your breathing worse (like smoke, chemicals, and fumes).  Use oxygen therapy and therapy to help improve your lungs (pulmonary rehabilitation) if told by your doctor. If you need home oxygen therapy, ask your doctor if you should buy a tool to measure your oxygen level (oximeter).  Avoid people who have a sickness you can catch (contagious).  Avoid going outside when it is very hot, cold, or humid.  Eat healthy foods. Eat smaller meals more often. Rest before meals.  Stay active, but remember to also rest.  Make sure to get all the shots (vaccines) your doctor recommends. Ask your doctor if you need a pneumonia shot.  Learn and use tips on how to relax.  Learn and use tips on how to control your breathing as told by your doctor. Try:  Breathing in (inhaling) through your nose for 1 second. Then, pucker your lips and breath out (exhale) through your lips for 2 seconds.  Putting one hand on your belly (abdomen). Breathe in slowly through your nose for 1 second. Your hand on your belly should move out. Pucker your lips and breathe  out slowly through your lips. Your hand on your belly should move in as you breathe out.  Learn and use controlled coughing to clear thick spit from your lungs. The steps are: 1. Lean your head a little forward. 2. Breathe in deeply. 3. Try to hold your breath for 3 seconds. 4. Keep your mouth slightly open while coughing 2 times. 5. Spit any thick spit out into a tissue. 6. Rest and do the steps again 1 or 2 times as needed. GET HELP IF:  You cough up more thick spit than usual.  There is a change in the color or thickness of the spit.  It is harder  to breathe than usual.  Your breathing is faster than usual. GET HELP RIGHT AWAY IF:  You have shortness of breath while resting.  You have shortness of breath that stops you from:  Being able to talk.  Doing normal activities.  You chest hurts for longer than 5 minutes.  Your skin color is more blue than usual.  Your pulse oximeter shows that you have low oxygen for longer than 5 minutes. MAKE SURE YOU:  Understand these instructions.  Will watch your condition.  Will get help right away if you are not doing well or get worse.   This information is not intended to replace advice given to you by your health care provider. Make sure you discuss any questions you have with your health care provider.   Document Released: 02/14/2008 Document Revised: 09/18/2014 Document Reviewed: 04/24/2013 Elsevier Interactive Patient Education [employer].

## 2016-02-29 NOTE — Progress Notes (Signed)
Subjective:  Patient ID: Travis Lutz, male    DOB: [birth year]  Age: 60 y.o. MRN: [id number]  CC: Pneumonia   HPI Allen A Leleux has hx of ETOH abuse, current smoker,  homelessness he presents for   1. F/u PNA: he was diagnosed with Left lower lobe  pneumonia ion 12/08/15. He was treated with  Azithromycin, vicodin and albuterol. He went to urgent care on 02/06/16 for R sided chest pain. Repeat x-ray was negative for infiltrate for positive for hyperinflation. He was given vicodin and indomethacin for his pain. He has smoked for the past 50 years. He now smokes 1 pack every 2 weeks.   Today, he reports persistent CP and SOB. He also has fatigue. No weight loss, no fever or chills. He is amenable to smoking cessation.   CXR with hyperinflation for since 2012.   Social History  Substance Use Topics  . Smoking status: Current Every Day Smoker -- 0.50 packs/day    Types: Cigarettes  . Smokeless tobacco: Not on file  . Alcohol Use: 0.0 oz/week    0 Standard drinks or equivalent per week     Comment: beer daily    Outpatient Prescriptions Prior to Visit  Medication Sig Dispense Refill  . acetaminophen (TYLENOL) 500 MG tablet Take 1,000 mg by mouth every 6 (six) hours as needed for moderate pain. Reported on 02/29/2016    . albuterol (PROVENTIL HFA;VENTOLIN HFA) 108 (90 Base) MCG/ACT inhaler Inhale 1-2 puffs into the lungs every 6 (six) hours as needed for wheezing or shortness of breath. (Patient not taking: Reported on 02/29/2016) 1 Inhaler 0  . azithromycin (ZITHROMAX) 250 MG tablet Take 1 tablet (250 mg total) by mouth daily. Take first 2 tablets together, then 1 every day until finished. (Patient not taking: Reported on 02/29/2016) 6 tablet 0  . betamethasone valerate ointment (VALISONE) 0.1 % Apply 1 application topically 2 (two) times daily. Apply to finger nail beds (Patient not taking: Reported on 02/29/2016) 45 g 0  . HYDROcodone-acetaminophen (NORCO/VICODIN) 5-325 MG tablet Take 2  tablets by mouth every 4 (four) hours as needed. (Patient not taking: Reported on 02/29/2016) 10 tablet 0  . HYDROcodone-acetaminophen (NORCO/VICODIN) 5-325 MG tablet Take 1 tablet by mouth every 4 (four) hours as needed. (Patient not taking: Reported on 02/29/2016) 20 tablet 0  . indomethacin (INDOCIN) 50 MG capsule Take 1 capsule (50 mg total) by mouth 2 (two) times daily with a meal. (Patient not taking: Reported on 02/29/2016) 20 capsule 0  . SUMAtriptan (IMITREX) 25 MG tablet Take 1 tablet (25 mg total) by mouth every 2 (two) hours as needed for migraine. May repeat in 2 hours if headache persists or recurs. (Patient not taking: Reported on 02/29/2016) 10 tablet 0  . terbinafine (LAMISIL) 250 MG tablet Take 1 tablet (250 mg total) by mouth daily. (Patient not taking: Reported on 02/29/2016) 30 tablet 2  . traMADol (ULTRAM) 50 MG tablet Take 1 tablet (50 mg total) by mouth every 12 (twelve) hours as needed. (Patient not taking: Reported on 02/29/2016) 60 tablet 0  . Vitamin D, Ergocalciferol, (DRISDOL) 50000 units CAPS capsule Take 1 capsule (50,000 Units total) by mouth every 7 (seven) days. For 12 weeks (Patient not taking: Reported on 12/06/2015) 12 capsule 0   No facility-administered medications prior to visit.    ROS Review of Systems  Constitutional: Positive for fatigue. Negative for fever, chills and unexpected weight change.  Eyes: Positive for pain, discharge, itching and visual disturbance. Negative  for photophobia and redness.  Respiratory: Positive for chest tightness and shortness of breath. Negative for apnea, cough, choking and stridor.   Cardiovascular: Negative for chest pain, palpitations and leg swelling.  Gastrointestinal: Negative for nausea, vomiting, abdominal pain, diarrhea, constipation and blood in stool.  Endocrine: Negative for polydipsia, polyphagia and polyuria.  Musculoskeletal: Positive for arthralgias. Negative for myalgias, back pain, gait problem and neck pain.    Skin: Negative for rash.  Allergic/Immunologic: Negative for immunocompromised state.  Hematological: Negative for adenopathy. Does not bruise/bleed easily.  Psychiatric/Behavioral: Negative for suicidal ideas, sleep disturbance and dysphoric mood. The patient is not nervous/anxious.     Objective:  BP 109/74 mmHg  Pulse 72  Temp(Src) 98.2 F (36.8 C) (Oral)  Resp 16  Ht 6\' 1"  (1.854 m)  Wt 125 lb (56.7 kg)  BMI 16.50 kg/m2  SpO2 99%  BP/Weight 02/29/2016 02/06/2016 [id number]  Systolic BP 0000000 [id number]  Diastolic BP 74 87 81  Wt. (Lbs) 125 - 125.25  BMI 16.5 - 18.49   Physical Exam  Constitutional: He appears well-developed and well-nourished. No distress.  HENT:  Head: Normocephalic and atraumatic.  Neck: Normal range of motion. Neck supple.  Cardiovascular: Normal rate, regular rhythm, normal heart sounds and intact distal pulses.   Pulmonary/Chest: No respiratory distress. He has decreased breath sounds. He has no wheezes. He has no rales. He exhibits no tenderness.  Musculoskeletal: He exhibits no edema.       Right hand: He exhibits swelling.       Left hand: He exhibits swelling.  Swelling around nail beds Clubbing of fingers   Neurological: He is alert.  Skin: Skin is warm and dry. No rash noted. No erythema.  Psychiatric: He has a normal mood and affect.     Assessment & Plan:   There are no diagnoses linked to this encounter. Laurie was seen today for pneumonia.  Diagnoses and all orders for this visit:  Smoker -     buPROPion (WELLBUTRIN XL) 150 MG 24 hr tablet; Take 1 tablet (150 mg total) by mouth daily.  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 -     betamethasone valerate ointment (VALISONE) 0.1 %; Apply 1 application topically 2 (two) times daily. Apply to finger nail  beds  Dyspnea -     Fluticasone-Salmeterol (ADVAIR) 100-50 MCG/DOSE AEPB; Inhale 1 puff into the lungs 2 (two) times daily. -     CBC  Arthralgia of hand, left -     naproxen (NAPROSYN) 500 MG tablet; Take 1 tablet (500 mg total) by mouth 2 (two) times daily with a meal.  Allergic rhinoconjunctivitis of both eyes -     fluticasone (FLONASE) 50 MCG/ACT nasal spray; Place 2 sprays into both nostrils daily. -     ketotifen (ZADITOR) 0.025 % ophthalmic solution; Place 1 drop into both eyes 2 (two) times daily.   Meds ordered this encounter  Medications  . albuterol (PROVENTIL HFA;VENTOLIN HFA) 108 (90 Base) MCG/ACT inhaler    Sig: Inhale 1-2 puffs into the lungs every 6 (six) hours as needed for wheezing or shortness of breath.    Dispense:  1 Inhaler    Refill:  0  . betamethasone valerate ointment (VALISONE) 0.1 %    Sig: Apply 1 application topically 2 (two) times daily.  Apply to finger nail beds    Dispense:  45 g    Refill:  0  . naproxen (NAPROSYN) 500 MG tablet    Sig: Take 1 tablet (500 mg total) by mouth 2 (two) times daily with a meal.    Dispense:  30 tablet    Refill:  0  . Fluticasone-Salmeterol (ADVAIR) 100-50 MCG/DOSE AEPB    Sig: Inhale 1 puff into the lungs 2 (two) times daily.    Dispense:  1 each    Refill:  1  . buPROPion (WELLBUTRIN XL) 150 MG 24 hr tablet    Sig: Take 1 tablet (150 mg total) by mouth daily.    Dispense:  30 tablet    Refill:  1  . fluticasone (FLONASE) 50 MCG/ACT nasal spray    Sig: Place 2 sprays into both nostrils daily.    Dispense:  16 g    Refill:  6  . ketotifen (ZADITOR) 0.025 % ophthalmic solution    Sig: Place 1 drop into both eyes 2 (two) times daily.    Dispense:  10 mL    Refill:  1    Follow-up: Return in about 6 weeks (around 04/11/2016).   Boykin Nearing MD

## 2016-02-29 NOTE — Assessment & Plan Note (Signed)
A: smoker desires to quit P: wellbutrin for smoking cessation

## 2016-02-29 NOTE — Assessment & Plan Note (Signed)
A: Dyspnea in smoker with hyperinflation for 5 years, suspect COPD or emphysema.  P: Refilled Albuterol, Advair  CBC  pulm referral

## 2016-02-29 NOTE — Progress Notes (Signed)
F/U pneumonia Pt stated still with [facility] and chest pain  Tobacco user 1 pack per 2 weeks Pain scale #7 No suicidal thoughts in the two weeks

## 2016-03-02 ENCOUNTER — Other Ambulatory Visit: Payer: Self-pay | Admitting: Family Medicine

## 2016-03-02 ENCOUNTER — Telehealth: Payer: Self-pay

## 2016-03-02 ENCOUNTER — Other Ambulatory Visit: Payer: Self-pay | Admitting: *Deleted

## 2016-03-02 DIAGNOSIS — R0600 Dyspnea, unspecified: Secondary | ICD-10-CM

## 2016-03-02 DIAGNOSIS — L03012 Cellulitis of left finger: Secondary | ICD-10-CM

## 2016-03-02 LAB — CBC

## 2016-03-02 MED ORDER — FLUTICASONE-SALMETEROL 100-50 MCG/DOSE IN AEPB: 1.0000 | INHALATION_SPRAY | Freq: Two times a day (BID) | RESPIRATORY_TRACT | Status: DC

## 2016-03-02 MED ORDER — ALBUTEROL SULFATE HFA 108 (90 BASE) MCG/ACT IN AERS: 1.0000 | INHALATION_SPRAY | Freq: Four times a day (QID) | RESPIRATORY_TRACT | Status: DC | PRN

## 2016-03-02 NOTE — Addendum Note (Signed)
Addended by: Boykin Nearing on: 03/02/2016 01:05 PM   Modules accepted: Orders

## 2016-03-02 NOTE — Telephone Encounter (Signed)
[facility] verified.  Rn advised patient that we need him to return to off for blood re-draw.  Patient verbalized understanding plans to return 03/02/2016 Priscille Heidelberg, RN, BSN

## 2016-05-23 ENCOUNTER — Ambulatory Visit (HOSPITAL_COMMUNITY)
Admission: EM | Admit: 2016-05-23 | Discharge: 2016-05-23 | Disposition: A | Discharge status: Home / Self Care | Payer: Self-pay | Attending: Family Medicine | Admitting: Family Medicine

## 2016-05-23 ENCOUNTER — Encounter (HOSPITAL_COMMUNITY): Payer: Self-pay | Admitting: Emergency Medicine

## 2016-05-23 DIAGNOSIS — R519 Headache, unspecified: Secondary | ICD-10-CM

## 2016-05-23 DIAGNOSIS — R51 Headache: Secondary | ICD-10-CM

## 2016-05-23 MED ORDER — KETOROLAC TROMETHAMINE 30 MG/ML IJ SOLN
INTRAMUSCULAR | Status: AC
  Filled 2016-05-23: qty 1

## 2016-05-23 MED ORDER — KETOROLAC TROMETHAMINE 30 MG/ML IJ SOLN
30.0000 mg | Freq: Once | INTRAMUSCULAR | Status: AC
  Administered 2016-05-23: 30 mg via INTRAMUSCULAR

## 2016-05-23 MED ORDER — IPRATROPIUM BROMIDE 0.06 % NA SOLN: 2.0000 | Freq: Four times a day (QID) | NASAL | 1 refills | Status: DC

## 2016-05-23 MED ORDER — METHYLPREDNISOLONE ACETATE 80 MG/ML IJ SUSP
80.0000 mg | Freq: Once | INTRAMUSCULAR | Status: AC
  Administered 2016-05-23: 80 mg via INTRAMUSCULAR

## 2016-05-23 MED ORDER — METHYLPREDNISOLONE ACETATE 80 MG/ML IJ SUSP
INTRAMUSCULAR | Status: AC
  Filled 2016-05-23: qty 1

## 2016-05-23 NOTE — ED Triage Notes (Signed)
Patient complains of a headache.  Patient has a runny nose.  Patient says he has nausea, but no vomiting.  Patient says he has had a headache for 3 days.    Patient has tried tylenol,  Continues to have pain.

## 2016-05-23 NOTE — ED Provider Notes (Signed)
[place]    CSN: [id number] Arrival date & time: 05/23/16  1148  First Provider Contact:  First MD Initiated Contact with Patient 05/23/16 1235        History   Chief Complaint Chief Complaint  Patient presents with  . Headache    HPI Travis Lutz is a 60 y.o. male.   The history is provided by the patient.  Headache  Pain location:  L temporal Quality:  Sharp Radiates to:  Does not radiate Onset quality:  Sudden Duration:  3 days Progression:  Unchanged Chronicity:  New Similar to prior headaches: no   Context: emotional stress   Context: not exposure to bright light, not caffeine and not eating   Relieved by:  None tried Ineffective treatments:  None tried Associated symptoms: congestion, drainage and nausea   Associated symptoms: no abdominal pain, no blurred vision, no fever, no neck stiffness, no photophobia and no vomiting     No past medical history on file.  Patient Active Problem List   Diagnosis Date Noted  . Dyspnea 02/29/2016  . Chronic low back pain 12/06/2015  . Bilateral chronic knee pain 12/06/2015  . Foot pain, bilateral 12/06/2015  . Vitamin D deficiency 11/16/2015  . Migraine headache 11/15/2015  . Testicular pain 11/15/2015  . 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 Homeless single person 11/15/2015  . Smoker 11/15/2015  . Alcohol use disorder ([place]) 11/15/2015    Past Surgical History:  Procedure Laterality Date  . REPAIR OF PERFORATED ULCER         Home Medications    Prior to Admission medications   Medication Sig Start Date End Date Taking? Authorizing Provider  acetaminophen (TYLENOL) 500 MG tablet Take 1,000 mg by mouth every 6 (six) hours as needed for moderate pain. Reported on 02/29/2016    Historical Provider, MD  albuterol (PROVENTIL HFA;VENTOLIN HFA) 108 (90 Base) MCG/ACT inhaler Inhale 1-2 puffs into the lungs every 6 (six) hours as needed for wheezing or shortness of breath. 03/02/16    Boykin Nearing, MD  betamethasone valerate ointment (VALISONE) 0.1 % Apply 1 application topically 2 (two) times daily. Apply to finger nail beds 02/29/16   Boykin Nearing, MD  buPROPion (WELLBUTRIN XL) 150 MG 24 hr tablet Take 1 tablet (150 mg total) by mouth daily. 02/29/16   Josalyn Funches, MD  fluticasone (FLONASE) 50 MCG/ACT nasal spray Place 2 sprays into both nostrils daily. 02/29/16   Josalyn Funches, MD  Fluticasone-Salmeterol (ADVAIR) 100-50 MCG/DOSE AEPB Inhale 1 puff into the lungs 2 (two) times daily. 03/02/16   Josalyn Funches, MD  ketotifen (ZADITOR) 0.025 % ophthalmic solution Place 1 drop into both eyes 2 (two) times daily. 02/29/16   Josalyn Funches, MD  naproxen (NAPROSYN) 500 MG tablet Take 1 tablet (500 mg total) by mouth 2 (two) times daily with a meal. 02/29/16   Josalyn Funches, MD  SUMAtriptan (IMITREX) 25 MG tablet Take 1 tablet (25 mg total) by mouth every 2 (two) hours as needed for migraine. May repeat in 2 hours if headache persists or recurs. Patient not taking: Reported on 02/29/2016 11/15/15   Boykin Nearing, MD  traMADol (ULTRAM) 50 MG tablet Take 1 tablet (50 mg total) by mouth every 12 (twelve) hours as needed. Patient not taking: Reported on 02/29/2016 12/06/15   Boykin Nearing, MD  Vitamin D, Ergocalciferol, (DRISDOL) 50000 units CAPS capsule Take 1 capsule (50,000 Units total) by mouth every 7 (seven) days. For 12 weeks Patient  not taking: Reported on 12/06/2015 11/16/15   Boykin Nearing, MD    Family History No family history on file.  Social History Social History  Substance Use Topics  . Smoking status: Current Every Day Smoker    Packs/day: 0.50    Types: Cigarettes  . Smokeless tobacco: Not on file  . Alcohol use 0.0 oz/week     Comment: beer daily      Allergies   Review of patient's allergies indicates no known allergies.   Review of Systems Review of Systems  Constitutional: Negative.  Negative for fever.  HENT: Positive for congestion and  postnasal drip.   Eyes: Negative for blurred vision and photophobia.  Gastrointestinal: Positive for nausea. Negative for abdominal pain and vomiting.  Musculoskeletal: Negative for neck stiffness.  Neurological: Positive for headaches.     Physical Exam Triage Vital Signs ED Triage Vitals [05/23/16 1226]  Enc Vitals Group     BP 150/88     Pulse Rate 87     Resp 16     Temp 98.1 F (36.7 C)     Temp Source Oral     SpO2 99 %     Weight      Height      Head Circumference      Peak Flow      Pain Score      Pain Loc      Pain Edu?      Excl. in [place]?    No data found.   Updated Vital Signs BP 150/88 (BP Location: Left Arm)   Pulse 87   Temp 98.1 F (36.7 C) (Oral)   Resp 16   SpO2 99%   Visual Acuity Right Eye Distance:   Left Eye Distance:   Bilateral Distance:    Right Eye Near:   Left Eye Near:    Bilateral Near:     Physical Exam  Constitutional: He is oriented to person, place, and time. He appears well-developed and well-nourished.  HENT:  Head: Normocephalic.  Right Ear: External ear normal.  Left Ear: External ear normal.  Eyes: Conjunctivae and EOM are normal. Pupils are equal, round, and reactive to light.  Neck: Normal range of motion. Neck supple.  Lymphadenopathy:    He has no cervical adenopathy.  Neurological: He is alert and oriented to person, place, and time.  Skin: Skin is warm and dry.  Nursing note and vitals reviewed.    UC Treatments / Results  Labs (all labs ordered are listed, but only abnormal results are displayed) Labs Reviewed - No data to display  EKG  EKG Interpretation None       Radiology No results found.  Procedures Procedures (including critical care time)  Medications Ordered in UC Medications - No data to display   Initial Impression / Assessment and Plan / UC Course  I have reviewed the triage vital signs and the nursing notes.  Pertinent labs & imaging results that were available during my  care of the patient were reviewed by me and considered in my medical decision making (see chart for details).  Clinical Course      Final Clinical Impressions(s) / UC Diagnoses   Final diagnoses:  None    New Prescriptions New Prescriptions   No medications on file     Billy Fischer, MD 05/23/16 1304

## 2016-06-16 ENCOUNTER — Ambulatory Visit (HOSPITAL_COMMUNITY)
Admission: EM | Admit: 2016-06-16 | Discharge: 2016-06-16 | Disposition: A | Discharge status: Home / Self Care | Payer: Self-pay | Attending: Family Medicine | Admitting: Family Medicine

## 2016-06-16 ENCOUNTER — Ambulatory Visit (INDEPENDENT_AMBULATORY_CARE_PROVIDER_SITE_OTHER): Payer: Self-pay

## 2016-06-16 ENCOUNTER — Encounter (HOSPITAL_COMMUNITY): Payer: Self-pay | Admitting: Emergency Medicine

## 2016-06-16 DIAGNOSIS — J181 Lobar pneumonia, unspecified organism: Secondary | ICD-10-CM

## 2016-06-16 DIAGNOSIS — J189 Pneumonia, unspecified organism: Secondary | ICD-10-CM

## 2016-06-16 MED ORDER — HYDROCODONE-ACETAMINOPHEN 7.5-325 MG/15ML PO SOLN: 10.0000 mL | Freq: Four times a day (QID) | ORAL | 0 refills | Status: DC | PRN

## 2016-06-16 MED ORDER — AZITHROMYCIN 250 MG PO TABS: 250.0000 mg | ORAL_TABLET | Freq: Every day | ORAL | 0 refills | Status: DC

## 2016-06-16 MED ORDER — IPRATROPIUM-ALBUTEROL 0.5-2.5 (3) MG/3ML IN SOLN: 3.0000 mL | Freq: Once | RESPIRATORY_TRACT | Status: DC

## 2016-06-16 MED ORDER — ALBUTEROL SULFATE HFA 108 (90 BASE) MCG/ACT IN AERS: 1.0000 | INHALATION_SPRAY | Freq: Four times a day (QID) | RESPIRATORY_TRACT | 0 refills | Status: DC | PRN

## 2016-06-16 MED FILL — AZITHROMYCIN 250 MG TABLET: 250 | 5 days supply | Qty: 6 | Fill #0

## 2016-06-16 NOTE — ED Triage Notes (Signed)
The patient presented to the [facility] with a complaint of a cough, fever, congestion and chest wall pain with the cough.

## 2016-06-16 NOTE — ED Provider Notes (Signed)
CSN: [id number]     Arrival date & time 06/16/16  1155 History   First MD Initiated Contact with Patient 06/16/16 1258     Chief Complaint  Patient presents with  . Cough   (Consider location/radiation/quality/duration/timing/severity/associated sxs/prior Treatment) HPI This is a new problem: Pt with cough, fever and sputum production for 1 week. Now wheezing. Smokes 1ppd X  OTC meds are not helping.  Pain in chest from coughing 7. Tylenol for fever.  History reviewed. No pertinent past medical history. Past Surgical History:  Procedure Laterality Date  . REPAIR OF PERFORATED ULCER     History reviewed. No pertinent family history. Social History  Substance Use Topics  . Smoking status: Current Every Day Smoker    Packs/day: 0.50    Types: Cigarettes  . Smokeless tobacco: Never Used  . Alcohol use 0.0 oz/week     Comment: beer daily     Review of Systems  Denies: HEADACHE, NAUSEA, ABDOMINAL PAIN, CHEST PAIN, CONGESTION, DYSURIA, SHORTNESS OF BREATH  Allergies  Review of patient's allergies indicates no known allergies.  Home Medications   Prior to Admission medications   Medication Sig Start Date End Date Taking? Authorizing Provider  acetaminophen (TYLENOL) 500 MG tablet Take 1,000 mg by mouth every 6 (six) hours as needed for moderate pain. Reported on 02/29/2016   Yes Historical Provider, MD  albuterol (PROVENTIL HFA;VENTOLIN HFA) 108 (90 Base) MCG/ACT inhaler Inhale 1-2 puffs into the lungs every 6 (six) hours as needed for wheezing or shortness of breath. 03/02/16   Boykin Nearing, MD  betamethasone valerate ointment (VALISONE) 0.1 % Apply 1 application topically 2 (two) times daily. Apply to finger nail beds 02/29/16   Boykin Nearing, MD  buPROPion (WELLBUTRIN XL) 150 MG 24 hr tablet Take 1 tablet (150 mg total) by mouth daily. 02/29/16   Josalyn Funches, MD  fluticasone (FLONASE) 50 MCG/ACT nasal spray Place 2 sprays into both nostrils daily. 02/29/16   Josalyn Funches,  MD  Fluticasone-Salmeterol (ADVAIR) 100-50 MCG/DOSE AEPB Inhale 1 puff into the lungs 2 (two) times daily. 03/02/16   Josalyn Funches, MD  ipratropium (ATROVENT) 0.06 % nasal spray Place 2 sprays into both nostrils 4 (four) times daily. 05/23/16   Billy Fischer, MD  ketotifen (ZADITOR) 0.025 % ophthalmic solution Place 1 drop into both eyes 2 (two) times daily. 02/29/16   Josalyn Funches, MD  naproxen (NAPROSYN) 500 MG tablet Take 1 tablet (500 mg total) by mouth 2 (two) times daily with a meal. 02/29/16   Josalyn Funches, MD  SUMAtriptan (IMITREX) 25 MG tablet Take 1 tablet (25 mg total) by mouth every 2 (two) hours as needed for migraine. May repeat in 2 hours if headache persists or recurs. Patient not taking: Reported on 02/29/2016 11/15/15   Boykin Nearing, MD  traMADol (ULTRAM) 50 MG tablet Take 1 tablet (50 mg total) by mouth every 12 (twelve) hours as needed. Patient not taking: Reported on 02/29/2016 12/06/15   Boykin Nearing, MD  Vitamin D, Ergocalciferol, (DRISDOL) 50000 units CAPS capsule Take 1 capsule (50,000 Units total) by mouth every 7 (seven) days. For 12 weeks Patient not taking: Reported on 12/06/2015 11/16/15   Boykin Nearing, MD   Meds Ordered and Administered this Visit  Medications - No data to display  BP 124/79 (BP Location: Left Arm)   Pulse 82   Temp 98.9 F (37.2 C) (Oral)   Resp 14   SpO2 100%  No data found.   Physical Exam NURSES NOTES AND  VITAL SIGNS REVIEWED. CONSTITUTIONAL: Well developed, well nourished, no acute distress HEENT: normocephalic, atraumatic EYES: Conjunctiva normal NECK:normal ROM, supple, no adenopathy PULMONARY:No respiratory distress, normal effort, coarse diffuse wheezes throughout lung fields. No consolidation.  ABDOMINAL: Soft, ND, NT BS+, No CVAT MUSCULOSKELETAL: Normal ROM of all extremities,  SKIN: warm and dry without rash PSYCHIATRIC: Mood and affect, behavior are normal  Urgent Care Course   Clinical Course   t  Procedures  (including critical care time)  Labs Review Labs Reviewed - No data to display  Imaging Review Dg Chest 2 View  Result Date: 06/16/2016 CLINICAL DATA:  Cough and fever.  History of smoking. EXAM: CHEST  2 VIEW COMPARISON:  02/06/2016 FINDINGS: Cardiomediastinal silhouette is normal. Mediastinal contours appear intact. There is no evidence of, pleural effusion or pneumothorax. Stable hyperinflation of the lungs. Subtle peribronchial airspace opacities are seen in bilateral lower lobes, right more than left. Osseous structures are without acute abnormality. Soft tissues are grossly normal. IMPRESSION: Subtle peribronchial airspace opacities in the lung bases, right more than left. This may represent acute bronchitis versus early developing pneumonia. Stable hyperinflation of the lungs, which may be seen with COPD. Electronically Signed   By: Fidela Salisbury M.D.   On: 06/16/2016 13:27     Visual Acuity Review  Right Eye Distance:   Left Eye Distance:   Bilateral Distance:    Right Eye Near:   Left Eye Near:    Bilateral Near:         MDM   1. Community acquired pneumonia of right lower lobe of lung [facility])     Patient is reassured that there are no issues that require transfer to higher level of care at this time or additional tests. Patient is advised to continue home symptomatic treatment. Patient is advised that if there are new or worsening symptoms to attend the emergency department, contact primary care provider, or return to UC. Instructions of care provided discharged home in stable condition.    THIS NOTE WAS GENERATED USING A VOICE RECOGNITION SOFTWARE PROGRAM. ALL REASONABLE EFFORTS  WERE MADE TO PROOFREAD THIS DOCUMENT FOR ACCURACY.  I have verbally reviewed the discharge instructions with the patient. A printed AVS was given to the patient.  All questions were answered prior to discharge.      Konrad Felix, [place] 06/16/16 1549

## 2016-08-25 ENCOUNTER — Ambulatory Visit (HOSPITAL_COMMUNITY)
Admission: EM | Admit: 2016-08-25 | Discharge: 2016-08-25 | Disposition: A | Discharge status: Home / Self Care | Payer: Self-pay | Attending: Internal Medicine | Admitting: Internal Medicine

## 2016-08-25 ENCOUNTER — Ambulatory Visit (INDEPENDENT_AMBULATORY_CARE_PROVIDER_SITE_OTHER): Payer: Self-pay

## 2016-08-25 ENCOUNTER — Encounter (HOSPITAL_COMMUNITY): Payer: Self-pay | Admitting: Emergency Medicine

## 2016-08-25 DIAGNOSIS — M7712 Lateral epicondylitis, left elbow: Secondary | ICD-10-CM

## 2016-08-25 MED ORDER — KETOROLAC TROMETHAMINE 60 MG/2ML IM SOLN
INTRAMUSCULAR | Status: AC
  Filled 2016-08-25: qty 2

## 2016-08-25 MED ORDER — KETOROLAC TROMETHAMINE 60 MG/2ML IM SOLN
60.0000 mg | Freq: Once | INTRAMUSCULAR | Status: AC
  Administered 2016-08-25: 60 mg via INTRAMUSCULAR

## 2016-08-25 MED ORDER — NAPROXEN 500 MG PO TABS: 500.0000 mg | ORAL_TABLET | Freq: Two times a day (BID) | ORAL | 0 refills | Status: DC

## 2016-08-25 MED FILL — NAPROXEN 500 MG TABLET: 500 | 10 days supply | Qty: 20 | Fill #0

## 2016-08-25 NOTE — ED Triage Notes (Signed)
The patient presented to the [facility] with a complaint of left arm pain that starts in his elbow and radiates down to his hand. The patient reported that pain for 1 month and does not recall any specific injury to the arm.

## 2016-08-26 NOTE — ED Provider Notes (Signed)
CSN: [id number]     Arrival date & time 08/25/16  1010 History   None    Chief Complaint  Patient presents with  . Arm Pain   (Consider location/radiation/quality/duration/timing/severity/associated sxs/prior Treatment) Patient presents with c/o left arm pain and elbow discomfort for over a month.   The history is provided by the patient.  Arm Pain  This is a new problem. The current episode started more than 1 week ago. The problem occurs constantly. The problem has not changed since onset.Nothing aggravates the symptoms.    History reviewed. No pertinent past medical history. Past Surgical History:  Procedure Laterality Date  . REPAIR OF PERFORATED ULCER     History reviewed. No pertinent family history. Social History  Substance Use Topics  . Smoking status: Current Every Day Smoker    Packs/day: 0.50    Types: Cigarettes  . Smokeless tobacco: Never Used  . Alcohol use 0.0 oz/week     Comment: beer daily     Review of Systems  Constitutional: Negative.   HENT: Negative.   Eyes: Negative.   Respiratory: Negative.   Cardiovascular: Negative.   Gastrointestinal: Negative.   Endocrine: Negative.   Genitourinary: Negative.   Musculoskeletal: Positive for arthralgias.  Skin: Negative.   Allergic/Immunologic: Negative.   Neurological: Negative.   Hematological: Negative.   Psychiatric/Behavioral: Negative.     Allergies  Patient has no known allergies.  Home Medications   Prior to Admission medications   Medication Sig Start Date End Date Taking? Authorizing Provider  acetaminophen (TYLENOL) 500 MG tablet Take 1,000 mg by mouth every 6 (six) hours as needed for moderate pain. Reported on 02/29/2016    Historical Provider, MD  albuterol (PROVENTIL HFA;VENTOLIN HFA) 108 (90 Base) MCG/ACT inhaler Inhale 1-2 puffs into the lungs every 6 (six) hours as needed for wheezing or shortness of breath. 03/02/16   Boykin Nearing, MD  albuterol (PROVENTIL HFA;VENTOLIN HFA) 108  (90 Base) MCG/ACT inhaler Inhale 1-2 puffs into the lungs every 6 (six) hours as needed for wheezing or shortness of breath. 06/16/16   Konrad Felix, PA  azithromycin (ZITHROMAX) 250 MG tablet Take 1 tablet (250 mg total) by mouth daily. Take first 2 tablets together, then 1 every day until finished. 06/16/16   Konrad Felix, PA  betamethasone valerate ointment (VALISONE) 0.1 % Apply 1 application topically 2 (two) times daily. Apply to finger nail beds 02/29/16   Boykin Nearing, MD  buPROPion (WELLBUTRIN XL) 150 MG 24 hr tablet Take 1 tablet (150 mg total) by mouth daily. 02/29/16   Josalyn Funches, MD  fluticasone (FLONASE) 50 MCG/ACT nasal spray Place 2 sprays into both nostrils daily. 02/29/16   Josalyn Funches, MD  Fluticasone-Salmeterol (ADVAIR) 100-50 MCG/DOSE AEPB Inhale 1 puff into the lungs 2 (two) times daily. 03/02/16   Josalyn Funches, MD  HYDROcodone-acetaminophen (HYCET) 7.5-325 mg/15 ml solution Take 10 mLs by mouth 4 (four) times daily as needed for moderate pain. 06/16/16   Konrad Felix, PA  ipratropium (ATROVENT) 0.06 % nasal spray Place 2 sprays into both nostrils 4 (four) times daily. 05/23/16   Billy Fischer, MD  ketotifen (ZADITOR) 0.025 % ophthalmic solution Place 1 drop into both eyes 2 (two) times daily. 02/29/16   Josalyn Funches, MD  naproxen (NAPROSYN) 500 MG tablet Take 1 tablet (500 mg total) by mouth 2 (two) times daily with a meal. 02/29/16   Josalyn Funches, MD  naproxen (NAPROSYN) 500 MG tablet Take 1 tablet (500 mg total) by  mouth 2 (two) times daily with a meal. 08/25/16   Lysbeth Penner, FNP  SUMAtriptan (IMITREX) 25 MG tablet Take 1 tablet (25 mg total) by mouth every 2 (two) hours as needed for migraine. May repeat in 2 hours if headache persists or recurs. Patient not taking: Reported on 08/25/2016 11/15/15   Boykin Nearing, MD  traMADol (ULTRAM) 50 MG tablet Take 1 tablet (50 mg total) by mouth every 12 (twelve) hours as needed. Patient not taking: Reported on  08/25/2016 12/06/15   Boykin Nearing, MD  Vitamin D, Ergocalciferol, (DRISDOL) 50000 units CAPS capsule Take 1 capsule (50,000 Units total) by mouth every 7 (seven) days. For 12 weeks Patient not taking: Reported on 08/25/2016 11/16/15   Boykin Nearing, MD   Meds Ordered and Administered this Visit   Medications  ketorolac (TORADOL) injection 60 mg (60 mg Intramuscular Given 08/25/16 1233)    BP 116/75 (BP Location: Right Arm)   Pulse 70   Temp 98.1 F (36.7 C) (Oral)   Resp 16   SpO2 100%  No data found.   Physical Exam  Constitutional: He appears well-developed and well-nourished.  HENT:  Head: Normocephalic and atraumatic.  Eyes: Conjunctivae and EOM are normal. Pupils are equal, round, and reactive to light.  Neck: Normal range of motion. Neck supple.  Cardiovascular: Normal rate, regular rhythm and normal heart sounds.   Pulmonary/Chest: Effort normal and breath sounds normal.  Musculoskeletal:  Left lateral epicondylitis  Nursing note and vitals reviewed.   Urgent Care Course   Clinical Course     Procedures (including critical care time)  Labs Review Labs Reviewed - No data to display  Imaging Review Dg Elbow Complete Left  Result Date: 08/25/2016 CLINICAL DATA:  Worsening left elbow pain over the past month. Pain, swelling and limited range of motion. EXAM: LEFT ELBOW - COMPLETE 3+ VIEW COMPARISON:  05/22/2011. FINDINGS: The joint spaces are maintained. No acute bony findings. No degenerative changes. No osteochondral abnormality. Suspect a small joint effusion. IMPRESSION: Suspect small elbow joint effusion. No acute bony findings or significant degenerative changes. Electronically Signed   By: Marijo Sanes M.D.   On: 08/25/2016 12:11     Visual Acuity Review  Right Eye Distance:   Left Eye Distance:   Bilateral Distance:    Right Eye Near:   Left Eye Near:    Bilateral Near:         MDM   1. Left lateral epicondylitis    Naprosyn 500mg  one  po bid x 10 days #20      Lysbeth Penner, FNP 08/26/16 [id number]

## 2018-04-23 ENCOUNTER — Other Ambulatory Visit: Payer: Self-pay

## 2018-04-23 ENCOUNTER — Emergency Department (HOSPITAL_COMMUNITY)
Admission: EM | Admit: 2018-04-23 | Discharge: 2018-04-23 | Disposition: A | Discharge status: Home / Self Care | Payer: BLUE CROSS/BLUE SHIELD | Attending: Emergency Medicine | Admitting: Emergency Medicine

## 2018-04-23 ENCOUNTER — Emergency Department (HOSPITAL_COMMUNITY): Payer: BLUE CROSS/BLUE SHIELD

## 2018-04-23 ENCOUNTER — Encounter (HOSPITAL_COMMUNITY): Payer: Self-pay

## 2018-04-23 DIAGNOSIS — F17210 Nicotine dependence, cigarettes, uncomplicated: Secondary | ICD-10-CM | POA: Insufficient documentation

## 2018-04-23 DIAGNOSIS — Z79899 Other long term (current) drug therapy: Secondary | ICD-10-CM | POA: Diagnosis not present

## 2018-04-23 DIAGNOSIS — N410 Acute prostatitis: Secondary | ICD-10-CM | POA: Insufficient documentation

## 2018-04-23 DIAGNOSIS — R1030 Lower abdominal pain, unspecified: Secondary | ICD-10-CM | POA: Diagnosis present

## 2018-04-23 LAB — CBC WITH DIFFERENTIAL/PLATELET
BASOS ABS: 0 10*3/uL (ref 0.0–0.1)
BASOS PCT: 0 %
EOS ABS: 0 10*3/uL (ref 0.0–0.7)
EOS PCT: 1 %
HCT: 42.8 % (ref 39.0–52.0)
HEMOGLOBIN: 15.1 g/dL (ref 13.0–17.0)
Lymphocytes Relative: 23 %
Lymphs Abs: 1.4 10*3/uL (ref 0.7–4.0)
MCH: 35.8 pg — AB (ref 26.0–34.0)
MCHC: 35.3 g/dL (ref 30.0–36.0)
MCV: 101.4 fL — ABNORMAL HIGH (ref 78.0–100.0)
Monocytes Absolute: 0.8 10*3/uL (ref 0.1–1.0)
Monocytes Relative: 14 %
NEUTROS PCT: 62 %
Neutro Abs: 3.7 10*3/uL (ref 1.7–7.7)
PLATELETS: 341 10*3/uL (ref 150–400)
RBC: 4.22 MIL/uL (ref 4.22–5.81)
RDW: 14.4 % (ref 11.5–15.5)
WBC: 6 10*3/uL (ref 4.0–10.5)

## 2018-04-23 LAB — COMPREHENSIVE METABOLIC PANEL
ALBUMIN: 4.5 g/dL (ref 3.5–5.0)
ALK PHOS: 65 U/L (ref 38–126)
ALT: 26 U/L (ref 0–44)
AST: 32 U/L (ref 15–41)
Anion gap: 10 (ref 5–15)
BUN: 12 mg/dL (ref 6–20)
CALCIUM: 9.5 mg/dL (ref 8.9–10.3)
CHLORIDE: 105 mmol/L (ref 98–111)
CO2: 25 mmol/L (ref 22–32)
CREATININE: 0.78 mg/dL (ref 0.61–1.24)
GFR calc non Af Amer: >60 mL/min (ref >60)
GLUCOSE: 90 mg/dL (ref 70–99)
Potassium: 4.2 mmol/L (ref 3.5–5.1)
SODIUM: 140 mmol/L (ref 135–145)
Total Bilirubin: 1.3 mg/dL — ABNORMAL HIGH (ref 0.3–1.2)
Total Protein: 8 g/dL (ref 6.5–8.1)

## 2018-04-23 LAB — I-STAT CG4 LACTIC ACID, ED: Lactic Acid, Venous: 1.38 mmol/L (ref 0.5–1.9)

## 2018-04-23 LAB — URINALYSIS, ROUTINE W REFLEX MICROSCOPIC
BILIRUBIN URINE: NEGATIVE
Glucose, UA: NEGATIVE mg/dL
HGB URINE DIPSTICK: NEGATIVE
KETONES UR: NEGATIVE mg/dL
LEUKOCYTES UA: NEGATIVE
Nitrite: NEGATIVE
PH: 6 (ref 5.0–8.0)
Protein, ur: NEGATIVE mg/dL
Specific Gravity, Urine: 1.011 (ref 1.005–1.030)

## 2018-04-23 LAB — LIPASE, BLOOD: LIPASE: 28 U/L (ref 11–51)

## 2018-04-23 MED ORDER — ONDANSETRON HCL 4 MG/2ML IJ SOLN
4.0000 mg | Freq: Once | INTRAMUSCULAR | Status: AC
  Administered 2018-04-23: 4 mg via INTRAVENOUS
  Filled 2018-04-23: qty 2

## 2018-04-23 MED ORDER — CEFTRIAXONE SODIUM 250 MG IJ SOLR
250.0000 mg | Freq: Once | INTRAMUSCULAR | Status: AC
  Administered 2018-04-23: 250 mg via INTRAMUSCULAR
  Filled 2018-04-23: qty 250

## 2018-04-23 MED ORDER — IOPAMIDOL (ISOVUE-300) INJECTION 61%
INTRAVENOUS | Status: AC
  Filled 2018-04-23: qty 100

## 2018-04-23 MED ORDER — CIPROFLOXACIN HCL 500 MG PO TABS: 500.0000 mg | ORAL_TABLET | Freq: Two times a day (BID) | ORAL | 0 refills | Status: AC

## 2018-04-23 MED ORDER — HYDROCODONE-ACETAMINOPHEN 5-325 MG PO TABS: 1.0000 | ORAL_TABLET | Freq: Four times a day (QID) | ORAL | 0 refills | Status: DC | PRN

## 2018-04-23 MED ORDER — OXYCODONE-ACETAMINOPHEN 5-325 MG PO TABS
2.0000 | ORAL_TABLET | Freq: Once | ORAL | Status: AC
  Administered 2018-04-23: 2 via ORAL
  Filled 2018-04-23: qty 2

## 2018-04-23 MED ORDER — MORPHINE SULFATE (PF) 4 MG/ML IV SOLN
4.0000 mg | Freq: Once | INTRAVENOUS | Status: AC
  Administered 2018-04-23: 4 mg via INTRAVENOUS
  Filled 2018-04-23: qty 1

## 2018-04-23 MED ORDER — NAPROXEN 375 MG PO TABS: 375.0000 mg | ORAL_TABLET | Freq: Two times a day (BID) | ORAL | 0 refills | Status: AC | PRN

## 2018-04-23 MED ORDER — KETOROLAC TROMETHAMINE 15 MG/ML IJ SOLN
15.0000 mg | Freq: Once | INTRAMUSCULAR | Status: AC
  Administered 2018-04-23: 15 mg via INTRAVENOUS
  Filled 2018-04-23: qty 1

## 2018-04-23 MED ORDER — SODIUM CHLORIDE 0.9 % IV BOLUS
1000.0000 mL | Freq: Once | INTRAVENOUS | Status: AC
  Administered 2018-04-23: 1000 mL via INTRAVENOUS

## 2018-04-23 MED ORDER — IOPAMIDOL (ISOVUE-300) INJECTION 61%
100.0000 mL | Freq: Once | INTRAVENOUS | Status: AC | PRN
  Administered 2018-04-23: 100 mL via INTRAVENOUS

## 2018-04-23 MED ORDER — CEFTRIAXONE SODIUM 250 MG IJ SOLR: 250.0000 mg | Freq: Once | INTRAMUSCULAR | 0 refills | Status: DC

## 2018-04-23 NOTE — ED Triage Notes (Signed)
Pt reports that he has abdominal pain that radiates down to bis groin pt reports that pain onset began months ago and is intermittent. Pt reports that pain frequency has increased and pain has made it difficult to ambulate. Pt states that he has a lump near the left side of his scrotum and that his scrotum gets swollen. 9/10 pressure pounding pain.  Pt denies any injury or trauma. Pt has a midline abdominal incision in which he reports had surgery for ulcers.

## 2018-04-23 NOTE — ED Provider Notes (Signed)
[facility] DEPT Provider Note   CSN: [id number] Arrival date & time: 04/23/18  1113     History   Chief Complaint Chief Complaint  Patient presents with  . Groin Swelling  . Testicle Pain    HPI Travis Lutz is a 62 y.o. male.  HPI   62 year old male here with severe lower abdominal pain.  The patient states that over the last month, he has had intermittent fullness and pain of his left inguinal area.  He is noticed that he has an area of swelling that will pop out, then quickly returned.  He states that over the last several days, the pain is been constant and he said constant swelling since then.  Pain is aching and throbbing.  Is with palpation and movement.  Denies alleviating factors.  He feels like he has been having to strain to go to the restroom as well.  Denies any blood in his stools.  No nausea or vomiting.  No fevers, weight loss, night sweats, or chills.  No other complaints.  History reviewed. No pertinent past medical history.  Patient Active Problem List   Diagnosis Date Noted  . Dyspnea 02/29/2016  . Chronic low back pain 12/06/2015  . Bilateral chronic knee pain 12/06/2015  . Foot pain, bilateral 12/06/2015  . Vitamin D deficiency 11/16/2015  . Migraine headache 11/15/2015  . Testicular pain 11/15/2015  . 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 Homeless single person 11/15/2015  . Smoker 11/15/2015  . Alcohol use disorder 11/15/2015    Past Surgical History:  Procedure Laterality Date  . REPAIR OF PERFORATED ULCER          Home Medications    Prior to Admission medications   Medication Sig Start Date End Date Taking? Authorizing Provider  acetaminophen (TYLENOL) 500 MG tablet Take 1,000 mg by mouth every 6 (six) hours as needed for moderate pain. Reported on 02/29/2016   Yes [provider]  albuterol (PROVENTIL HFA;VENTOLIN HFA) 108 (90 Base) MCG/ACT inhaler Inhale 1-2 puffs into the  lungs every 6 (six) hours as needed for wheezing or shortness of breath. 03/02/16  Yes Funches, Josalyn, MD  betamethasone valerate ointment (VALISONE) 0.1 % Apply 1 application topically 2 (two) times daily. Apply to finger nail beds 02/29/16  Yes Funches, Josalyn, MD  Fluticasone-Salmeterol (ADVAIR) 100-50 MCG/DOSE AEPB Inhale 1 puff into the lungs 2 (two) times daily. 03/02/16  Yes Funches, Josalyn, MD  ipratropium (ATROVENT) 0.06 % nasal spray Place 2 sprays into both nostrils 4 (four) times daily. 05/23/16  Yes Kindl, Nelda Severe, MD  ketotifen (ZADITOR) 0.025 % ophthalmic solution Place 1 drop into both eyes 2 (two) times daily. 02/29/16  Yes Funches, Josalyn, MD  albuterol (PROVENTIL HFA;VENTOLIN HFA) 108 (90 Base) MCG/ACT inhaler Inhale 1-2 puffs into the lungs every 6 (six) hours as needed for wheezing or shortness of breath. Patient not taking: Reported on 04/23/2018 06/16/16   Konrad Felix, PA  azithromycin (ZITHROMAX) 250 MG tablet Take 1 tablet (250 mg total) by mouth daily. Take first 2 tablets together, then 1 every day until finished. Patient not taking: Reported on 04/23/2018 06/16/16   Konrad Felix, PA  buPROPion (WELLBUTRIN XL) 150 MG 24 hr tablet Take 1 tablet (150 mg total) by mouth daily. Patient not taking: Reported on 04/23/2018 02/29/16   Boykin Nearing, MD  ciprofloxacin (CIPRO) 500 MG tablet Take 1 tablet (500 mg total) by mouth every 12 (twelve) hours for  14 days. 04/23/18 05/07/18  Duffy Bruce, MD  fluticasone (FLONASE) 50 MCG/ACT nasal spray Place 2 sprays into both nostrils daily. Patient not taking: Reported on 04/23/2018 02/29/16   Boykin Nearing, MD  HYDROcodone-acetaminophen (NORCO/VICODIN) 5-325 MG tablet Take 1-2 tablets by mouth every 6 (six) hours as needed for moderate pain or severe pain. 04/23/18   Duffy Bruce, MD  naproxen (NAPROSYN) 375 MG tablet Take 1 tablet (375 mg total) by mouth 2 (two) times daily as needed for up to 10 days for moderate pain. 04/23/18  05/03/18  Duffy Bruce, MD  SUMAtriptan (IMITREX) 25 MG tablet Take 1 tablet (25 mg total) by mouth every 2 (two) hours as needed for migraine. May repeat in 2 hours if headache persists or recurs. Patient not taking: Reported on 04/23/2018 11/15/15   Boykin Nearing, MD  traMADol (ULTRAM) 50 MG tablet Take 1 tablet (50 mg total) by mouth every 12 (twelve) hours as needed. Patient not taking: Reported on 08/25/2016 12/06/15   Boykin Nearing, MD  Vitamin D, Ergocalciferol, (DRISDOL) 50000 units CAPS capsule Take 1 capsule (50,000 Units total) by mouth every 7 (seven) days. For 12 weeks Patient not taking: Reported on 08/25/2016 11/16/15   Boykin Nearing, MD    Family History History reviewed. No pertinent family history.  Social History Social History   Tobacco Use  . Smoking status: Current Every Day Smoker    Packs/day: 0.50    Types: Cigarettes  . Smokeless tobacco: Never Used  Substance Use Topics  . Alcohol use: Yes    Alcohol/week: 0.0 standard drinks    Comment: beer daily   . Drug use: Yes    Types: Marijuana     Allergies   Patient has no known allergies.   Review of Systems Review of Systems  Constitutional: Positive for fatigue. Negative for chills and fever.  HENT: Negative for congestion, ear pain, rhinorrhea and sore throat.   Eyes: Negative for pain and visual disturbance.  Respiratory: Negative for cough, shortness of breath and wheezing.   Cardiovascular: Negative for chest pain, palpitations and leg swelling.  Gastrointestinal: Positive for abdominal pain and nausea. Negative for diarrhea and vomiting.  Genitourinary: Negative for dysuria, flank pain and hematuria.  Musculoskeletal: Negative for arthralgias, back pain, neck pain and neck stiffness.  Skin: Negative for color change, rash and wound.  Allergic/Immunologic: Negative for immunocompromised state.  Neurological: Negative for seizures, syncope, weakness and headaches.  All other systems  reviewed and are negative.    Physical Exam Updated Vital Signs BP (!) 146/98   Pulse 64   Temp 98.3 F (36.8 C) (Oral)   Resp 18   Ht 6\' 1"  (1.854 m)   Wt 65.8 kg   SpO2 100%   BMI 19.13 kg/m   Physical Exam  Constitutional: He is oriented to person, place, and time. He appears well-developed and well-nourished. No distress.  HENT:  Head: Normocephalic and atraumatic.  Mouth/Throat: Oropharynx is clear and moist.  Eyes: Conjunctivae are normal.  Neck: Neck supple.  Cardiovascular: Normal rate, regular rhythm and normal heart sounds. Exam reveals no friction rub.  No murmur heard. Pulmonary/Chest: Effort normal and breath sounds normal. No respiratory distress. He has no wheezes. He has no rales.  Abdominal: Soft. He exhibits no distension. There is tenderness. There is guarding. There is no rebound.  Genitourinary:  Genitourinary Comments: Diffuse TTP over inguinal area bl. Testes descended with intact cremasterics bilaterally. No penile lesions or discharge. Mild LAD.  Musculoskeletal: He exhibits no  edema.  Neurological: He is alert and oriented to person, place, and time. He exhibits normal muscle tone.  Skin: Skin is warm. Capillary refill takes less than 2 seconds.  Psychiatric: He has a normal mood and affect.  Nursing note and vitals reviewed.    ED Treatments / Results  Labs (all labs ordered are listed, but only abnormal results are displayed) Labs Reviewed  CBC WITH DIFFERENTIAL/PLATELET - Abnormal; Notable for the following components:      Result Value   MCV 101.4 (*)    MCH 35.8 (*)    All other components within normal limits  COMPREHENSIVE METABOLIC PANEL - Abnormal; Notable for the following components:   Total Bilirubin 1.3 (*)    All other components within normal limits  LIPASE, BLOOD  URINALYSIS, ROUTINE W REFLEX MICROSCOPIC  I-STAT CG4 LACTIC ACID, ED  I-STAT CG4 LACTIC ACID, ED  GC/CHLAMYDIA PROBE AMP ([place]) NOT AT [facility]     EKG None  Radiology Ct Abdomen Pelvis W Contrast  Result Date: 04/23/2018 CLINICAL DATA:  Intermittent abdominal pain EXAM: CT ABDOMEN AND PELVIS WITH CONTRAST TECHNIQUE: Multidetector CT imaging of the abdomen and pelvis was performed using the standard protocol following bolus administration of intravenous contrast. CONTRAST:  15mL ISOVUE-300 IOPAMIDOL (ISOVUE-300) INJECTION 61% COMPARISON:  July 12, 2015 FINDINGS: Lower chest: There is scarring in both lower lung zones. There is no edema or consolidation. Hepatobiliary: There are scattered tiny probable cysts in the liver. There is a cyst near the dome of the liver on the right measuring 1.5 x 1.5 cm. There is an 8 mm cyst in the left lobe of the liver. There is a degree of underlying hepatic steatosis. Gallbladder wall is not appreciably thickened. There is no biliary duct dilatation. Pancreas: No pancreatic mass or inflammatory focus. The pancreatic duct in the head region is upper normal in size, stable. Spleen: No splenic lesions evident. Adrenals/Urinary Tract: Adrenals bilaterally appear unremarkable. Kidneys bilaterally show no evident mass or hydronephrosis on either side. There is no evident renal or ureteral calculus on either side. Urinary bladder is midline with mild wall. Stomach/Bowel: Rectum is distended with air. No perirectal wall thickening evident. There is no evident bowel wall or mesenteric thickening. There is no appreciable bowel obstruction. There is no evident free air or portal venous air. Vascular/Lymphatic: There is atherosclerotic calcification in the aorta and common iliac arteries. No evident abdominal aortic aneurysm. Major mesenteric arterial vessels appear patent, although there is moderate narrowing at the origin of the celiac artery. No adenopathy is evident in the abdomen or pelvis. Reproductive: Prostate and seminal vesicles appear normal in size and contour. No evident pelvic mass. A few tiny prostatic  calculi noted. Other: Appendix appears normal. No evident ascites or abscess is evident in the abdomen or pelvis. There is calcification in the right inguinal region, stable. No hernia evident. Musculoskeletal: There are foci of degenerative change in the lumbar spine. There are no blastic or lytic bone lesions. 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 There is no intramuscular or abdominal wall lesion. IMPRESSION: 1. No evident bowel obstruction. No abscess in the abdomen or pelvis. Appendix appears normal. 2. Mild thickening of the urinary bladder wall. Suspect a degree of cystitis. 3. No evident renal or ureteral calculus. No hydronephrosis. There are occasional small prostatic calculi. 4.  Avascular necrosis right femoral head. 5. Aortoiliac atherosclerosis. No evident aneurysm. Moderate narrowing proximal celiac artery. Aortic Atherosclerosis (ICD10-I70.0). Electronically Signed   By: Gwyndolyn Saxon  Jasmine December III M.D.   On: 04/23/2018 14:47    Procedures Procedures (including critical care time)  Medications Ordered in ED Medications  iopamidol (ISOVUE-300) 61 % injection (has no administration in time range)  morphine 4 MG/ML injection 4 mg (4 mg Intravenous Given 04/23/18 1250)  ondansetron (ZOFRAN) injection 4 mg (4 mg Intravenous Given 04/23/18 1250)  sodium chloride 0.9 % bolus 1,000 mL (0 mLs Intravenous Stopped 04/23/18 1355)  iopamidol (ISOVUE-300) 61 % injection 100 mL (100 mLs Intravenous Contrast Given 04/23/18 1423)  oxyCODONE-acetaminophen (PERCOCET/ROXICET) 5-325 MG per tablet 2 tablet (2 tablets Oral Given 04/23/18 1548)  ketorolac (TORADOL) 15 MG/ML injection 15 mg (15 mg Intravenous Given 04/23/18 1548)  cefTRIAXone (ROCEPHIN) injection 250 mg (250 mg Intramuscular Given 04/23/18 1548)     Initial Impression / Assessment and Plan / ED Course  I have reviewed the triage vital signs and the nursing notes.  Pertinent labs & imaging results that were available during my care  of the patient were reviewed by me and considered in my medical decision making (see chart for details).     62 yo M here with lower abdominal pain and pressure. Pt with diffuse lower abdominal/inguinal TTP on exam. No signs of testicular torsion, mass, or abscess on exam. Lab work is overall reassuring, but given significant TTP, CT scan obtained. CT scan shows thickening of bladder wall, prostate enlargement, and prostatic calculi. Incidental AVn noted femoral head - no pain here. On further questioning, pt does endorse lower pelvic pressure, frequency, sensation of incomplete emptying. Suspect this may be 2/2 subacute prostatitis. Declines prostate exam but given CT findings, complaints, and history, will tx with Cipro, also give dose of rocephin for STD coverage. Will have him f/u with a Urologist re :his sx, prostate calcification. Return precautions given.  Final Clinical Impressions(s) / ED Diagnoses   Final diagnoses:  Acute prostatitis    ED Discharge Orders         Ordered    cefTRIAXone (ROCEPHIN) 250 MG injection   Once,   Status:  Discontinued     04/23/18 1538    ciprofloxacin (CIPRO) 500 MG tablet  Every 12 hours     04/23/18 1539    naproxen (NAPROSYN) 375 MG tablet  2 times daily PRN     04/23/18 1539    HYDROcodone-acetaminophen (NORCO/VICODIN) 5-325 MG tablet  Every 6 hours PRN     04/23/18 1539           Duffy Bruce, MD 04/23/18 1818

## 2018-04-24 NOTE — ED Provider Notes (Signed)
Patient called asking how to use the ceftriaxone medicine without needles. It appears he already received IM ceftriaxone here and the the Rx of this was supposed to be discontinued. Picked up his other meds, including cipro. Advised to return the ceftriaxone, doesn't need at this time.    Sherwood Gambler, MD 04/24/18 1218

## 2018-06-11 ENCOUNTER — Ambulatory Visit (HOSPITAL_COMMUNITY)
Admission: EM | Admit: 2018-06-11 | Discharge: 2018-06-11 | Disposition: A | Discharge status: Home / Self Care | Payer: BLUE CROSS/BLUE SHIELD | Attending: Family Medicine | Admitting: Family Medicine

## 2018-06-11 ENCOUNTER — Encounter (HOSPITAL_COMMUNITY): Payer: Self-pay | Admitting: Emergency Medicine

## 2018-06-11 ENCOUNTER — Other Ambulatory Visit: Payer: Self-pay

## 2018-06-11 DIAGNOSIS — L03011 Cellulitis of right finger: Secondary | ICD-10-CM

## 2018-06-11 MED ORDER — ONDANSETRON 4 MG PO TBDP
ORAL_TABLET | ORAL | Status: AC
Start: 2018-06-11 — End: ?
  Filled 2018-06-11: qty 1

## 2018-06-11 MED ORDER — PREDNISONE 10 MG (21) PO TBPK: ORAL_TABLET | ORAL | 0 refills | Status: DC

## 2018-06-11 MED ORDER — HYDROCODONE-ACETAMINOPHEN 5-325 MG PO TABS
ORAL_TABLET | ORAL | Status: AC
  Filled 2018-06-11: qty 1

## 2018-06-11 MED ORDER — HYDROCODONE-ACETAMINOPHEN 5-325 MG PO TABS
1.0000 | ORAL_TABLET | Freq: Once | ORAL | Status: AC
  Administered 2018-06-11: 1 via ORAL

## 2018-06-11 MED ORDER — CEPHALEXIN 500 MG PO CAPS: 500.0000 mg | ORAL_CAPSULE | Freq: Four times a day (QID) | ORAL | 0 refills | Status: DC

## 2018-06-11 MED ORDER — ONDANSETRON 4 MG PO TBDP
4.0000 mg | ORAL_TABLET | Freq: Once | ORAL | Status: AC
  Administered 2018-06-11: 4 mg via ORAL

## 2018-06-11 MED ORDER — TRIAMCINOLONE ACETONIDE 0.1 % EX CREA: 1.0000 "application " | TOPICAL_CREAM | Freq: Two times a day (BID) | CUTANEOUS | 0 refills | Status: DC

## 2018-06-11 MED ORDER — HYDROCODONE-ACETAMINOPHEN 5-325 MG PO TABS: 1.0000 | ORAL_TABLET | Freq: Four times a day (QID) | ORAL | 0 refills | Status: DC | PRN

## 2018-06-11 NOTE — ED Provider Notes (Addendum)
[place]    CSN: [id number] Arrival date & time: 06/11/18  0865     History   Chief Complaint Chief Complaint  Patient presents with  . Hand Pain    HPI Travis Lutz is a 62 y.o. male.   62 year old male past medical history of chronic back pain, headache, right knee pain, chronic paronychia of the right hand.  He presents with swelling around the nailbeds of the index ,middle and ring finger of the right hand.  The index finger has the most significant swelling and pain.  This has  been ongoing and worsening for about 1 week.  The middle and ring fingers are an ongoing problem and he suffers from chronic paronychia.  All fingers are tender to touch.  He denies any drainage from the fingers.  He denies any fever, chills, body aches, fatigue.  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  He denies biting his nails.  ROS per HPI    Hand Pain     History reviewed. No pertinent past medical history.  Patient Active Problem List   Diagnosis Date Noted  . Dyspnea 02/29/2016  . Chronic low back pain 12/06/2015  . Bilateral chronic knee pain 12/06/2015  . Foot pain, bilateral 12/06/2015  . Vitamin D deficiency 11/16/2015  . Migraine headache 11/15/2015  . Testicular pain 11/15/2015  . 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 Homeless single person 11/15/2015  . Smoker 11/15/2015  . Alcohol use disorder 11/15/2015    Past Surgical History:  Procedure Laterality Date  . REPAIR OF PERFORATED ULCER         Home Medications    Prior to Admission medications   Medication Sig Start Date End Date Taking? Authorizing Provider  albuterol (PROVENTIL HFA;VENTOLIN HFA) 108 (90 Base) MCG/ACT inhaler Inhale 1-2 puffs into the lungs every 6 (six) hours as needed for wheezing or shortness of breath. 03/02/16   Funches, Josalyn, MD  albuterol (PROVENTIL HFA;VENTOLIN HFA) 108 (90 Base) MCG/ACT inhaler Inhale 1-2  puffs into the lungs every 6 (six) hours as needed for wheezing or shortness of breath. Patient not taking: Reported on 04/23/2018 06/16/16   Konrad Felix, PA  azithromycin (ZITHROMAX) 250 MG tablet Take 1 tablet (250 mg total) by mouth daily. Take first 2 tablets together, then 1 every day until finished. Patient not taking: Reported on 04/23/2018 06/16/16   Konrad Felix, PA  betamethasone valerate ointment (VALISONE) 0.1 % Apply 1 application topically 2 (two) times daily. Apply to finger nail beds 02/29/16   Boykin Nearing, MD  buPROPion (WELLBUTRIN XL) 150 MG 24 hr tablet Take 1 tablet (150 mg total) by mouth daily. Patient not taking: Reported on 04/23/2018 02/29/16   Boykin Nearing, MD  cephALEXin (KEFLEX) 500 MG capsule Take 1 capsule (500 mg total) by mouth 4 (four) times daily. 06/11/18   Erum Cercone, Tressia Miners A, NP  fluticasone (FLONASE) 50 MCG/ACT nasal spray Place 2 sprays into both nostrils daily. Patient not taking: Reported on 04/23/2018 02/29/16   Boykin Nearing, MD  Fluticasone-Salmeterol (ADVAIR) 100-50 MCG/DOSE AEPB Inhale 1 puff into the lungs 2 (two) times daily. 03/02/16   Funches, Adriana Mccallum, MD  HYDROcodone-acetaminophen (NORCO/VICODIN) 5-325 MG tablet Take 1 tablet by mouth every 6 (six) hours as needed. 06/11/18   Nillie Bartolotta, Tressia Miners A, NP  ipratropium (ATROVENT) 0.06 % nasal spray Place 2 sprays into both nostrils 4 (four) times daily. 05/23/16  Billy Fischer, MD  ketotifen (ZADITOR) 0.025 % ophthalmic solution Place 1 drop into both eyes 2 (two) times daily. 02/29/16   Funches, Adriana Mccallum, MD  SUMAtriptan (IMITREX) 25 MG tablet Take 1 tablet (25 mg total) by mouth every 2 (two) hours as needed for migraine. May repeat in 2 hours if headache persists or recurs. Patient not taking: Reported on 04/23/2018 11/15/15   Boykin Nearing, MD  triamcinolone cream (KENALOG) 0.1 % Apply 1 application topically 2 (two) times daily. 06/11/18   Loura Halt A, NP  Vitamin D, Ergocalciferol, (DRISDOL) 50000 units  CAPS capsule Take 1 capsule (50,000 Units total) by mouth every 7 (seven) days. For 12 weeks Patient not taking: Reported on 08/25/2016 11/16/15   Boykin Nearing, MD    Family History History reviewed. No pertinent family history.  Social History Social History   Tobacco Use  . Smoking status: Current Every Day Smoker    Packs/day: 0.50    Types: Cigarettes  . Smokeless tobacco: Never Used  Substance Use Topics  . Alcohol use: Yes    Alcohol/week: 0.0 standard drinks    Comment: beer daily   . Drug use: Yes    Types: Marijuana     Allergies   Patient has no known allergies.   Review of Systems Review of Systems   Physical Exam Triage Vital Signs ED Triage Vitals  Enc Vitals Group     BP 06/11/18 1052 (!) 124/94     Pulse Rate 06/11/18 1052 73     Resp 06/11/18 1052 18     Temp 06/11/18 1052 98.4 F (36.9 C)     Temp Source 06/11/18 1052 Oral     SpO2 06/11/18 1052 100 %     Weight --      Height --      Head Circumference --      Peak Flow --      Pain Score 06/11/18 1050 9     Pain Loc --      Pain Edu? --      Excl. in [place]? --    No data found.  Updated Vital Signs BP (!) 124/94 (BP Location: Right Arm)   Pulse 73   Temp 98.4 F (36.9 C) (Oral)   Resp 18   SpO2 100%   Visual Acuity Right Eye Distance:   Left Eye Distance:   Bilateral Distance:    Right Eye Near:   Left Eye Near:    Bilateral Near:     Physical Exam  Constitutional: He is oriented to person, place, and time. He appears well-developed and well-nourished.  Very pleasant. Non toxic or ill appearing.     HENT:  Head: Normocephalic and atraumatic.  Eyes: Conjunctivae are normal.  Neck: Normal range of motion.  Pulmonary/Chest: Effort normal.  Musculoskeletal: Normal range of motion.  Neurological: He is alert and oriented to person, place, and time.  Skin: Skin is warm and dry.  Moderate swelling around nailbed of right index finger.  Very tender to touch.  No swelling to  pad of finger  Mild swelling to right middle and ring finger.  Mild tenderness to touch   Psychiatric: He has a normal mood and affect.  Nursing note and vitals reviewed.    UC Treatments / Results  Labs (all labs ordered are listed, but only abnormal results are displayed) Labs Reviewed - No data to display  EKG None  Radiology No results found.  Procedures Incision and Drainage Date/Time: 06/11/2018  4:58 PM Performed by: Orvan July, NP Authorized by: Orvan July, NP   Consent:    Consent obtained:  Verbal   Consent given by:  Patient   Risks discussed:  Bleeding, incomplete drainage, pain and infection   Alternatives discussed:  No treatment and delayed treatment Location:    Indications for incision and drainage: paronychia to the right index finger.    Location:  Upper extremity   Upper extremity location:  Finger   Finger location:  R index finger Pre-procedure details:    Skin preparation:  Betadine Anesthesia (see MAR for exact dosages):    Anesthesia method:  Topical application Procedure type:    Complexity:  Simple Procedure details:    Needle aspiration: no     Incision depth:  Subungual   Scalpel blade:  11   Drainage characteristics: none. Post-procedure details:    Patient tolerance of procedure:  Procedure terminated at patient's request   (including critical care time)  Medications Ordered in UC Medications  HYDROcodone-acetaminophen (NORCO/VICODIN) 5-325 MG per tablet 1 tablet (1 tablet Oral Given 06/11/18 1126)  ondansetron (ZOFRAN-ODT) disintegrating tablet 4 mg (4 mg Oral Given 06/11/18 1126)    Initial Impression / Assessment and Plan / UC Course  I have reviewed the triage vital signs and the nursing notes.  Pertinent labs & imaging results that were available during my care of the patient were reviewed by me and considered in my medical decision making (see chart for details).      Attempted I&D to paronychia of right index  finger.  Was unable to express any drainage.  Stopped procedure due to patient's intolerance for the pain. Gave pain medication and nausea medication.  No concern for felon at this time.  Will place patient on antibiotics and have him do warm soaks of the fingers. Triamcinolone cream to promote faster healing. Instructed to follow-up for continued or worsening symptoms. Final Clinical Impressions(s) / UC Diagnoses   Final diagnoses:  Paronychia of finger of right hand     Discharge Instructions     It was nice meeting you!!  We are going to treat you with antibiotics for the infection in your fingers. I am giving you some steroid cream to place on the fingers Make sure you are doing the warm soaks to the fingers. Avoid nailbiting and contact with water for long periods of time Follow up as needed for continued or worsening symptoms     ED Prescriptions    Medication Sig Dispense Auth. Provider   cephALEXin (KEFLEX) 500 MG capsule Take 1 capsule (500 mg total) by mouth 4 (four) times daily. 28 capsule Tabor Denham A, NP   predniSONE (STERAPRED UNI-PAK 21 TAB) 10 MG (21) TBPK tablet  (Status: Discontinued) 6 tabs for 1 day, then 5 tabs for 1 das, then 4 tabs for 1 day, then 3 tabs for 1 day, 2 tabs for 1 day, then 1 tab for 1 day 21 tablet Jadasia Haws A, NP   triamcinolone cream (KENALOG) 0.1 % Apply 1 application topically 2 (two) times daily. 30 g Shiya Fogelman A, NP   HYDROcodone-acetaminophen (NORCO/VICODIN) 5-325 MG tablet Take 1 tablet by mouth every 6 (six) hours as needed. 6 tablet Loura Halt A, NP     Controlled Substance Prescriptions [place] Controlled Substance Registry consulted? Not Applicable   Orvan July, NP 06/11/18 1658    Loura Halt A, NP 06/11/18 1700

## 2018-06-11 NOTE — ED Triage Notes (Signed)
Right index finger is painful.  Patient has swelling and pain at base of nail bed.  Having pain for a week

## 2018-06-11 NOTE — Discharge Instructions (Signed)
It was nice meeting you!!  We are going to treat you with antibiotics for the infection in your fingers. I am giving you some steroid cream to place on the fingers Make sure you are doing the warm soaks to the fingers. Avoid nailbiting and contact with water for long periods of time Follow up as needed for continued or worsening symptoms

## 2018-06-27 ENCOUNTER — Encounter (HOSPITAL_COMMUNITY): Payer: Self-pay | Admitting: Emergency Medicine

## 2018-06-27 ENCOUNTER — Ambulatory Visit (HOSPITAL_COMMUNITY)
Admission: EM | Admit: 2018-06-27 | Discharge: 2018-06-27 | Disposition: A | Discharge status: Home / Self Care | Payer: BLUE CROSS/BLUE SHIELD | Attending: Family Medicine | Admitting: Family Medicine

## 2018-06-27 DIAGNOSIS — K047 Periapical abscess without sinus: Secondary | ICD-10-CM

## 2018-06-27 MED ORDER — HYDROCODONE-ACETAMINOPHEN 5-325 MG PO TABS
ORAL_TABLET | ORAL | Status: AC
  Filled 2018-06-27: qty 1

## 2018-06-27 MED ORDER — HYDROCODONE-ACETAMINOPHEN 5-325 MG PO TABS
1.0000 | ORAL_TABLET | Freq: Once | ORAL | Status: AC
Start: 2018-06-27 — End: 2018-06-27
  Administered 2018-06-27: 1 via ORAL

## 2018-06-27 MED ORDER — IBUPROFEN 800 MG PO TABS: 800.0000 mg | ORAL_TABLET | Freq: Three times a day (TID) | ORAL | 0 refills | Status: DC

## 2018-06-27 MED ORDER — PENICILLIN V POTASSIUM 500 MG PO TABS: 500.0000 mg | ORAL_TABLET | Freq: Four times a day (QID) | ORAL | 0 refills | Status: AC

## 2018-06-27 NOTE — ED Triage Notes (Signed)
Pt states he has some dental pain and facial swelling on the R side of his face x2 days.

## 2018-06-27 NOTE — Discharge Instructions (Signed)
Complete course of antibiotics.  Ibuprofen as needed for pain control, take with food- soft diet as tolerated.  Please start calling today to get in with a dentist for definitive treatment of this.

## 2018-06-27 NOTE — ED Provider Notes (Signed)
[facility]    CSN: [id number] Arrival date & time: 06/27/18  1158     History   Chief Complaint Chief Complaint  Patient presents with  . Facial Swelling    HPI Travis Lutz is a 62 y.o. male.   Travis Lutz presents with complaints of right dental pain and facial swelling which started yesterday, worse today. Pain 9/10. Hasn't taken any medications for symptoms. No fevers. Swelling was worse this morning when he woke. Feels he has had a foul taste or potential drainage from affected area. Does not have a dentist. No known fevers. No ear pain. Pain radiates up to cheek. Hx of low back pain, migraines.     ROS per HPI.      History reviewed. No pertinent past medical history.  Patient Active Problem List   Diagnosis Date Noted  . Dyspnea 02/29/2016  . Chronic low back pain 12/06/2015  . Bilateral chronic knee pain 12/06/2015  . Foot pain, bilateral 12/06/2015  . Vitamin D deficiency 11/16/2015  . Migraine headache 11/15/2015  . Testicular pain 11/15/2015  . 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 Homeless single person 11/15/2015  . Smoker 11/15/2015  . Alcohol use disorder 11/15/2015    Past Surgical History:  Procedure Laterality Date  . REPAIR OF PERFORATED ULCER         Home Medications    Prior to Admission medications   Medication Sig Start Date End Date Taking? Authorizing Provider  albuterol (PROVENTIL HFA;VENTOLIN HFA) 108 (90 Base) MCG/ACT inhaler Inhale 1-2 puffs into the lungs every 6 (six) hours as needed for wheezing or shortness of breath. 03/02/16   Funches, Adriana Mccallum, MD  ibuprofen (ADVIL,MOTRIN) 800 MG tablet Take 1 tablet (800 mg total) by mouth 3 (three) times daily. 06/27/18   Zigmund Gottron, NP  penicillin v potassium (VEETID) 500 MG tablet Take 1 tablet (500 mg total) by mouth 4 (four) times daily for 7 days. 06/27/18 07/04/18  Zigmund Gottron, NP    Family History History reviewed. No pertinent family  history.  Social History Social History   Tobacco Use  . Smoking status: Current Every Day Smoker    Packs/day: 0.50    Types: Cigarettes  . Smokeless tobacco: Never Used  Substance Use Topics  . Alcohol use: Yes    Alcohol/week: 0.0 standard drinks    Comment: beer daily   . Drug use: Yes    Types: Marijuana     Allergies   Patient has no known allergies.   Review of Systems Review of Systems   Physical Exam Triage Vital Signs ED Triage Vitals [06/27/18 1246]  Enc Vitals Group     BP 138/88     Pulse Rate 84     Resp 16     Temp 98 F (36.7 C)     Temp src      SpO2 100 %     Weight      Height      Head Circumference      Peak Flow      Pain Score 9     Pain Loc      Pain Edu?      Excl. in [place]?    No data found.  Updated Vital Signs BP 138/88   Pulse 84   Temp 98 F (36.7 C)   Resp 16   SpO2 100%    Physical Exam  Constitutional: He is oriented to person, place, and  time. He appears well-developed and well-nourished.  Patient tearful in pain   HENT:  Head: Normocephalic and atraumatic.  Right Ear: Tympanic membrane, external ear and ear canal normal.  Left Ear: Tympanic membrane, external ear and ear canal normal.  Nose: Right sinus exhibits maxillary sinus tenderness.  Mouth/Throat: Oropharynx is clear and moist. No trismus in the jaw. Abnormal dentition. No uvula swelling.  Right upper molar #2 broken off with large hole to gumline; no obvious visible abscess, tenderness at gumline; soft under tongue  Cardiovascular: Normal rate and regular rhythm.  Pulmonary/Chest: Effort normal and breath sounds normal.  Neurological: He is alert and oriented to person, place, and time.  Skin: Skin is warm and dry.     UC Treatments / Results  Labs (all labs ordered are listed, but only abnormal results are displayed) Labs Reviewed - No data to display  EKG None  Radiology No results found.  Procedures Procedures (including critical care  time)  Medications Ordered in UC Medications  HYDROcodone-acetaminophen (NORCO/VICODIN) 5-325 MG per tablet 1 tablet (has no administration in time range)    Initial Impression / Assessment and Plan / UC Course  I have reviewed the triage vital signs and the nursing notes.  Pertinent labs & imaging results that were available during my care of the patient were reviewed by me and considered in my medical decision making (see chart for details).     Patient was given a ride here today, hydrocodone provided prior to departure. Course of penicillin as well as ibuprofen for pain control. Encouraged to call today to get in with dentist for definitive treatment. Patient verbalized understanding and agreeable to plan.   Final Clinical Impressions(s) / UC Diagnoses   Final diagnoses:  Dental abscess     Discharge Instructions     Complete course of antibiotics.  Ibuprofen as needed for pain control, take with food- soft diet as tolerated.  Please start calling today to get in with a dentist for definitive treatment of this.     ED Prescriptions    Medication Sig Dispense Auth. Provider   penicillin v potassium (VEETID) 500 MG tablet Take 1 tablet (500 mg total) by mouth 4 (four) times daily for 7 days. 28 tablet Augusto Gamble B, NP   ibuprofen (ADVIL,MOTRIN) 800 MG tablet Take 1 tablet (800 mg total) by mouth 3 (three) times daily. 21 tablet Zigmund Gottron, NP     Controlled Substance Prescriptions [place] Controlled Substance Registry consulted? Not Applicable   Zigmund Gottron, NP 06/27/18 1325

## 2019-02-11 ENCOUNTER — Ambulatory Visit (HOSPITAL_COMMUNITY)
Admission: EM | Admit: 2019-02-11 | Discharge: 2019-02-11 | Disposition: A | Discharge status: Home / Self Care | Payer: BC Managed Care – PPO | Attending: Family Medicine | Admitting: Family Medicine

## 2019-02-11 ENCOUNTER — Ambulatory Visit (INDEPENDENT_AMBULATORY_CARE_PROVIDER_SITE_OTHER): Payer: BC Managed Care – PPO

## 2019-02-11 ENCOUNTER — Other Ambulatory Visit: Payer: Self-pay

## 2019-02-11 ENCOUNTER — Encounter (HOSPITAL_COMMUNITY): Payer: Self-pay

## 2019-02-11 DIAGNOSIS — S8002XA Contusion of left knee, initial encounter: Secondary | ICD-10-CM

## 2019-02-11 DIAGNOSIS — S0990XA Unspecified injury of head, initial encounter: Secondary | ICD-10-CM | POA: Diagnosis not present

## 2019-02-11 DIAGNOSIS — S8992XA Unspecified injury of left lower leg, initial encounter: Secondary | ICD-10-CM

## 2019-02-11 MED ORDER — TRAMADOL HCL 50 MG PO TABS: 50.0000 mg | ORAL_TABLET | Freq: Four times a day (QID) | ORAL | 0 refills | Status: DC | PRN
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
## 2019-02-11 NOTE — Discharge Instructions (Addendum)
Follow up with your primary care doctor or here within 48-72 hours. Do your best to ensure adequate rest.  Please seek prompt medical care if: You have: A very bad (severe) headache that is not helped by medicine. Trouble walking or weakness in your arms and legs. Clear or bloody fluid coming from your nose or ears. Changes in your seeing (vision). Jerky movements that you cannot control (seizure). You throw up (vomit). Your symptoms get worse. You lose balance. Your speech is slurred. You pass out. The black centers of your eyes (pupils) change in size.  These symptoms may be an emergency. Do not wait to see if the symptoms will go away. Get medical help right away. Call your local emergency services. Do not drive yourself to the hospital.

## 2019-02-11 NOTE — ED Notes (Signed)
Patient able to ambulate independently  

## 2019-02-12 ENCOUNTER — Telehealth (HOSPITAL_COMMUNITY): Payer: Self-pay

## 2019-02-12 ENCOUNTER — Telehealth (HOSPITAL_COMMUNITY): Payer: Self-pay | Admitting: Family Medicine

## 2019-02-12 MED ORDER — TRAMADOL HCL 50 MG PO TABS: 50.0000 mg | ORAL_TABLET | Freq: Four times a day (QID) | ORAL | 0 refills | Status: DC | PRN

## 2019-02-12 NOTE — Telephone Encounter (Signed)
Received call from patient regarding tramadol being called into the incorrect pharmacy yesterday. Pt requesting tramadol be called in to the [facility] on [address]. Dr Mannie Stabile notified of need for change.

## 2019-02-12 NOTE — Telephone Encounter (Signed)
Calls. Requests Tramadol be sent to different pharmacy. Rx printed for him to pick up; unable to electronically send; keep receiving error message. RN to cancel previous Rx.

## 2019-02-13 ENCOUNTER — Encounter (HOSPITAL_COMMUNITY): Payer: Self-pay

## 2019-02-13 ENCOUNTER — Emergency Department (HOSPITAL_COMMUNITY)
Admission: EM | Admit: 2019-02-13 | Discharge: 2019-02-13 | Disposition: A | Discharge status: Home / Self Care | Payer: BC Managed Care – PPO | Attending: Emergency Medicine | Admitting: Emergency Medicine

## 2019-02-13 ENCOUNTER — Emergency Department (HOSPITAL_COMMUNITY): Payer: BC Managed Care – PPO

## 2019-02-13 ENCOUNTER — Other Ambulatory Visit: Payer: Self-pay

## 2019-02-13 DIAGNOSIS — N50819 Testicular pain, unspecified: Secondary | ICD-10-CM

## 2019-02-13 DIAGNOSIS — G8911 Acute pain due to trauma: Secondary | ICD-10-CM | POA: Diagnosis not present

## 2019-02-13 DIAGNOSIS — F17210 Nicotine dependence, cigarettes, uncomplicated: Secondary | ICD-10-CM | POA: Insufficient documentation

## 2019-02-13 DIAGNOSIS — I861 Scrotal varices: Secondary | ICD-10-CM | POA: Diagnosis not present

## 2019-02-13 DIAGNOSIS — F1290 Cannabis use, unspecified, uncomplicated: Secondary | ICD-10-CM | POA: Insufficient documentation

## 2019-02-13 DIAGNOSIS — N50812 Left testicular pain: Secondary | ICD-10-CM | POA: Insufficient documentation

## 2019-02-13 DIAGNOSIS — R6884 Jaw pain: Secondary | ICD-10-CM | POA: Diagnosis present

## 2019-02-13 DIAGNOSIS — K4090 Unilateral inguinal hernia, without obstruction or gangrene, not specified as recurrent: Secondary | ICD-10-CM | POA: Insufficient documentation

## 2019-02-13 MED ORDER — KETOROLAC TROMETHAMINE 60 MG/2ML IM SOLN
15.0000 mg | Freq: Once | INTRAMUSCULAR | Status: AC
  Administered 2019-02-13: 15 mg via INTRAMUSCULAR
  Filled 2019-02-13: qty 2

## 2019-02-13 MED ORDER — OXYCODONE HCL 5 MG PO TABS
5.0000 mg | ORAL_TABLET | Freq: Once | ORAL | Status: AC
  Administered 2019-02-13: 5 mg via ORAL
  Filled 2019-02-13: qty 1

## 2019-02-13 MED ORDER — ACETAMINOPHEN 500 MG PO TABS
1000.0000 mg | ORAL_TABLET | Freq: Once | ORAL | Status: AC
  Administered 2019-02-13: 1000 mg via ORAL
  Filled 2019-02-13: qty 2
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
## 2019-02-13 NOTE — ED Notes (Signed)
Patient verbalizes understanding of discharge instructions. Opportunity for questioning and answers were provided. Armband removed by staff, pt discharged from ED.  

## 2019-02-13 NOTE — Discharge Instructions (Signed)
Take 4 over the counter ibuprofen tablets 3 times a day or 2 over-the-counter naproxen tablets twice a day for pain. Also take tylenol 1000mg (2 extra strength) four times a day.    Follow up with general surgery for your hernia.  Urology for your testicular pain.  Wear tight fitting underpants.  Return for worsening pain, vomiting

## 2019-02-13 NOTE — ED Notes (Signed)
Patient transported to CT 

## 2019-06-20 ENCOUNTER — Emergency Department (HOSPITAL_COMMUNITY): Payer: BC Managed Care – PPO

## 2019-06-20 ENCOUNTER — Other Ambulatory Visit: Payer: Self-pay

## 2019-06-20 ENCOUNTER — Emergency Department (HOSPITAL_COMMUNITY)
Admission: EM | Admit: 2019-06-20 | Discharge: 2019-06-20 | Disposition: A | Discharge status: Home / Self Care | Payer: BC Managed Care – PPO | Attending: Emergency Medicine | Admitting: Emergency Medicine

## 2019-06-20 ENCOUNTER — Encounter (HOSPITAL_COMMUNITY): Payer: Self-pay | Admitting: Emergency Medicine

## 2019-06-20 DIAGNOSIS — F17210 Nicotine dependence, cigarettes, uncomplicated: Secondary | ICD-10-CM | POA: Insufficient documentation

## 2019-06-20 DIAGNOSIS — N433 Hydrocele, unspecified: Secondary | ICD-10-CM | POA: Diagnosis not present

## 2019-06-20 DIAGNOSIS — K4090 Unilateral inguinal hernia, without obstruction or gangrene, not specified as recurrent: Secondary | ICD-10-CM | POA: Diagnosis not present

## 2019-06-20 DIAGNOSIS — N50812 Left testicular pain: Secondary | ICD-10-CM

## 2019-06-20 LAB — URINALYSIS, ROUTINE W REFLEX MICROSCOPIC
Bilirubin Urine: NEGATIVE
Glucose, UA: NEGATIVE mg/dL
Hgb urine dipstick: NEGATIVE
Ketones, ur: NEGATIVE mg/dL
Leukocytes,Ua: NEGATIVE
Nitrite: NEGATIVE
Protein, ur: NEGATIVE mg/dL
Specific Gravity, Urine: 1.02 (ref 1.005–1.030)
pH: 7 (ref 5.0–8.0)

## 2019-06-20 LAB — BASIC METABOLIC PANEL
Anion gap: 8 (ref 5–15)
BUN: 9 mg/dL (ref 8–23)
CO2: 24 mmol/L (ref 22–32)
Calcium: 9.1 mg/dL (ref 8.9–10.3)
Chloride: 107 mmol/L (ref 98–111)
Creatinine, Ser: 0.97 mg/dL (ref 0.61–1.24)
GFR calc Af Amer: >60 mL/min (ref >60)
GFR calc non Af Amer: >60 mL/min (ref >60)
Glucose, Bld: 82 mg/dL (ref 70–99)
Potassium: 4 mmol/L (ref 3.5–5.1)
Sodium: 139 mmol/L (ref 135–145)

## 2019-06-20 LAB — CBC
HCT: 43.6 % (ref 39.0–52.0)
Hemoglobin: 15.2 g/dL (ref 13.0–17.0)
MCH: 36 pg — ABNORMAL HIGH (ref 26.0–34.0)
MCHC: 34.9 g/dL (ref 30.0–36.0)
MCV: 103.3 fL — ABNORMAL HIGH (ref 80.0–100.0)
Platelets: 286 10*3/uL (ref 150–400)
RBC: 4.22 MIL/uL (ref 4.22–5.81)
RDW: 13.5 % (ref 11.5–15.5)
WBC: 4.4 10*3/uL (ref 4.0–10.5)
nRBC: 0 % (ref 0.0–0.2)

## 2019-06-20 MED ORDER — ONDANSETRON HCL 4 MG PO TABS: 4.0000 mg | ORAL_TABLET | Freq: Three times a day (TID) | ORAL | 0 refills | Status: DC | PRN

## 2019-06-20 MED ORDER — DICLOFENAC SODIUM 75 MG PO TBEC: 75.0000 mg | DELAYED_RELEASE_TABLET | Freq: Two times a day (BID) | ORAL | 0 refills | Status: DC

## 2019-06-20 MED ORDER — ONDANSETRON HCL 4 MG/2ML IJ SOLN
4.0000 mg | Freq: Once | INTRAMUSCULAR | Status: AC
  Administered 2019-06-20: 4 mg via INTRAVENOUS
  Filled 2019-06-20: qty 2

## 2019-06-20 MED ORDER — MORPHINE SULFATE (PF) 4 MG/ML IV SOLN
4.0000 mg | Freq: Once | INTRAVENOUS | Status: AC
  Administered 2019-06-20: 4 mg via INTRAVENOUS
  Filled 2019-06-20: qty 1

## 2019-06-20 MED ORDER — IOHEXOL 300 MG/ML  SOLN
100.0000 mL | Freq: Once | INTRAMUSCULAR | Status: AC | PRN
  Administered 2019-06-20: 15:00:00 100 mL via INTRAVENOUS

## 2019-06-20 MED ORDER — KETOROLAC TROMETHAMINE 30 MG/ML IJ SOLN
30.0000 mg | Freq: Once | INTRAMUSCULAR | Status: AC
  Administered 2019-06-20: 17:00:00 30 mg via INTRAVENOUS
  Filled 2019-06-20: qty 1

## 2019-06-20 MED ORDER — HYDROMORPHONE HCL 1 MG/ML IJ SOLN
1.0000 mg | Freq: Once | INTRAMUSCULAR | Status: AC
  Administered 2019-06-20: 18:00:00 1 mg via INTRAVENOUS
  Filled 2019-06-20: qty 1

## 2019-06-20 NOTE — ED Notes (Signed)
Contact Information- Jamesedward Neels (daughter) [phone]

## 2019-06-20 NOTE — ED Provider Notes (Signed)
[place] EMERGENCY DEPARTMENT Provider Note   CSN: [id number] Arrival date & time: 06/20/19  1131     History   Chief Complaint Chief Complaint  Patient presents with  . Hernia    HPI Travis Lutz is a 63 y.o. male with past medical history of bilateral varicocele and left inguinal hernia, presenting to the emergency department with complaint of gradually worsening lower abdominal and testicular pain for multiple days.  He had and [facility] who evaluated him and recommended evaluation in the ED with concern for possible complications with his hernia.  He states his pain is been progressively worsening, worse with standing.  He reports associated nausea without vomiting.  He denies any new Rosanne Gutting urinary symptoms.  Pain is worse in his left testicle.  Has been taking Tylenol without relief. Per chart review, patient was evaluated in June 2020 in the ED where he received a scrotal ultrasound which revealed by bilateral varicoceles and a small testicular cyst.  He was also noted to have a left inguinal hernia.  He was given referral to both general surgery and urology.  He states he did not prefer to have any surgical hernia repair and he went to urology 2 days ago for his first visit for further management.     The history is provided by the patient.    History reviewed. No pertinent past medical history.  Patient Active Problem List   Diagnosis Date Noted  . Dyspnea 02/29/2016  . Chronic low back pain 12/06/2015  . Bilateral chronic knee pain 12/06/2015  . Foot pain, bilateral 12/06/2015  . Vitamin D deficiency 11/16/2015  . Migraine headache 11/15/2015  . Testicular pain 11/15/2015  . 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 Homeless single person 11/15/2015  . Smoker 11/15/2015  . Alcohol use disorder 11/15/2015    Past Surgical History:  Procedure Laterality Date  . REPAIR OF PERFORATED  ULCER          Home Medications    Prior to Admission medications   Medication Sig Start Date End Date Taking? Authorizing Provider  albuterol (PROVENTIL HFA;VENTOLIN HFA) 108 (90 Base) MCG/ACT inhaler Inhale 1-2 puffs into the lungs every 6 (six) hours as needed for wheezing or shortness of breath. 03/02/16   Funches, Adriana Mccallum, MD  ibuprofen (ADVIL,MOTRIN) 800 MG tablet Take 1 tablet (800 mg total) by mouth 3 (three) times daily. 06/27/18   Zigmund Gottron, NP  traMADol (ULTRAM) 50 MG tablet Take 1 tablet (50 mg total) by mouth every 6 (six) hours as needed. 02/12/19   Vanessa Kick, MD    Family History No family history on file.  Social History Social History   Tobacco Use  . Smoking status: Current Every Day Smoker    Packs/day: 0.50    Types: Cigarettes  . Smokeless tobacco: Never Used  Substance Use Topics  . Alcohol use: Yes    Alcohol/week: 0.0 standard drinks    Comment: beer daily   . Drug use: Yes    Types: Marijuana     Allergies   Patient has no known allergies.   Review of Systems Review of Systems  All other systems reviewed and are negative.    Physical Exam Updated Vital Signs BP (!) 179/103   Pulse 67   Temp 98.5 F (36.9 C) (Oral)   Resp 16   SpO2 100%   Physical Exam Vitals signs and nursing note  reviewed.  Constitutional:      General: He is not in acute distress.    Appearance: He is well-developed.  HENT:     Head: Normocephalic and atraumatic.  Eyes:     Conjunctiva/sclera: Conjunctivae normal.  Cardiovascular:     Rate and Rhythm: Normal rate and regular rhythm.  Pulmonary:     Effort: Pulmonary effort is normal. No respiratory distress.     Breath sounds: Normal breath sounds.  Abdominal:     General: Bowel sounds are normal.     Palpations: Abdomen is soft.     Tenderness: There is abdominal tenderness (lower abd). There is no guarding or rebound.  Genitourinary:    Penis: Normal and circumcised. No tenderness or  discharge.      Comments: There is generalized tenderness to left testicle.  There is some swelling noted to the left mons pubis overlying the region of the inguinal ring. there is no palpable hernia or masses in the scrotum.  There is no erythema or swelling of the scrotum. Skin:    General: Skin is warm.  Neurological:     Mental Status: He is alert.  Psychiatric:        Behavior: Behavior normal.      ED Treatments / Results  Labs (all labs ordered are listed, but only abnormal results are displayed) Labs Reviewed  CBC - Abnormal; Notable for the following components:      Result Value   MCV 103.3 (*)    MCH 36.0 (*)    All other components within normal limits  URINALYSIS, ROUTINE W REFLEX MICROSCOPIC  BASIC METABOLIC PANEL    EKG None  Radiology No results found.  Procedures Procedures (including critical care time)  Medications Ordered in ED Medications  morphine 4 MG/ML injection 4 mg (4 mg Intravenous Given 06/20/19 1418)  ondansetron (ZOFRAN) injection 4 mg (4 mg Intravenous Given 06/20/19 1419)     Initial Impression / Assessment and Plan / ED Course  I have reviewed the triage vital signs and the nursing notes.  Pertinent labs & imaging results that were available during my care of the patient were reviewed by me and considered in my medical decision making (see chart for details).        Patient with history of left inguinal hernia, bilateral varicoceles, presenting to the emergency department with worsening left testicular pain and lower abdominal pain for multiple days.  He was seen at [facility] urology 2 days ago and recommended to report to the ED urgently for further evaluation with concern for his hernia though he did not report until today.  No associated urinary symptoms, nausea vomiting, diarrhea or constipation.  He has some generalized lower abdominal tenderness as well as significant tenderness with palpation of the left testicle though  testicular and scrotal exam is otherwise normal. There is no obvious hernia palpated in the scrotum though there is a bulge to the left mons pubis overlying the region of the inguinal ring.  Pain medication ordered.  Labs are reassuring.  CT imaging ordered for further evaluation.  Care assumed at shift change by PA [facility], to follow CT imaging and determine disposition.  Final Clinical Impressions(s) / ED Diagnoses   Final diagnoses:  None    ED Discharge Orders    None       Robinson, [place] N, PA-C 06/20/19 1524    Charlesetta Shanks, MD 06/23/19 [phone]

## 2019-06-20 NOTE — ED Triage Notes (Addendum)
Pt reports he went to [facility] urology for possible hernia/ burning pain in bilateral flanks/swelling and pain in groin area. States they sent him over for further testing, states he is having some difficulty urinating, last urinated this am but states he had a decent amount of urinary output.

## 2019-06-20 NOTE — ED Notes (Signed)
Patient transported to CT 

## 2019-06-20 NOTE — Discharge Instructions (Addendum)
You can take 1 to 2 tablets of Tylenol (350mg -1000mg  depending on the dose) every 6 hours as needed for pain.  Do not exceed 4000 mg of Tylenol daily.  If your pain persists you can take 1 Voltaren twice daily with meals in between doses of Tylenol.  I usually recommend 400 to 600 mg of ibuprofen every 6 hours.  Take this with food to avoid upset stomach issues.  You can take Zofran as needed for nausea.  Plenty fluids and get plenty of rest.  Wear supportive underwear and can apply ice 20 minutes at a time off and on for comfort.   Follow-up with general surgery for reevaluation of your hernia.  Avoid heavy lifting (anything >25lbs)  in the meantime.  Follow-up with urology for reevaluation of your testicular pain.  Return to the emergency department if any concerning signs or symptoms develop such as fevers, persistent vomiting, worsening pain.

## 2019-06-20 NOTE — ED Notes (Signed)
Pt verbalized understanding of discharge paperwork and follow-up care.  °

## 2019-06-20 NOTE — ED Notes (Signed)
Patient transported to [facility] 

## 2019-06-20 NOTE — ED Provider Notes (Signed)
Received patient at signout from [facility].  Refer to provider note for full history and physical examination.  Briefly, patient is a 63 year old male with history of bilateral varicoceles and left inguinal hernia presents to the ED for evaluation of lower abdominal pain and testicular pain for several days.  He was seen by [facility] 2 days prior who recommended presentation to the ED for further evaluation but he waited to be seen.  On examination he has left testicular pain but no evidence of incarcerated hernia.  Plan for CT scan, possible ultrasound and reassessment.  If no evidence of acute surgical abdominal pathology likely stable for discharge home.  Physical Exam  BP (!) 179/103   Pulse 67   Temp 98.5 F (36.9 C) (Oral)   Resp 16   SpO2 100%   Physical Exam Vitals signs and nursing note reviewed.  Constitutional:      General: He is not in acute distress.    Appearance: He is well-developed.  HENT:     Head: Normocephalic and atraumatic.  Eyes:     General:        Right eye: No discharge.        Left eye: No discharge.     Conjunctiva/sclera: Conjunctivae normal.  Neck:     Vascular: No JVD.     Trachea: No tracheal deviation.  Cardiovascular:     Rate and Rhythm: Normal rate.  Pulmonary:     Effort: Pulmonary effort is normal.  Abdominal:     General: There is no distension.  Skin:    Findings: No erythema.  Neurological:     Mental Status: He is alert.  Psychiatric:        Behavior: Behavior normal.     ED Course/Procedures     Procedures  MDM  Ct Abdomen Pelvis W Contrast  Result Date: 06/20/2019 CLINICAL DATA:  Lower abdomen pain with nausea and vomiting for 2 weeks. EXAM: CT ABDOMEN AND PELVIS WITH CONTRAST TECHNIQUE: Multidetector CT imaging of the abdomen and pelvis was performed using the standard protocol following bolus administration of intravenous contrast. CONTRAST:  128mL OMNIPAQUE IOHEXOL 300 MG/ML  SOLN COMPARISON:  April 23, 2018  FINDINGS: Lower chest: No acute abnormality. Hepatobiliary: Liver cysts are identified unchanged compared prior exam. No other focal liver lesions identified. The gallbladder is normal. The biliary tree is normal. Pancreas: No focal pancreatic lesion is identified. Pancreatic duct is prominent measuring 3 mm unchanged. No inflammation is noted around pancreas. Spleen: Normal in size without focal abnormality. Adrenals/Urinary Tract: There is hypertrophy of left adrenal gland unchanged. The right adrenal gland is normal. The kidneys are normal. There is no hydronephrosis bilaterally. The bladder is normal. Stomach/Bowel: Stomach is within normal limits. Appendix is not seen but no inflammation is noted around cecum. No evidence of bowel wall thickening, distention, or inflammatory changes. Vascular/Lymphatic: Aortic atherosclerosis. No enlarged abdominal or pelvic lymph nodes. Reproductive: Prostate gland is enlarged measuring 6 cm in diameter. Other: None. Musculoskeletal: Mild degenerative joint changes of the spine are noted. 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 IMPRESSION: No acute abnormality identified in the abdomen and pelvis. No bowel obstruction. Stable liver cysts. Electronically Signed   By: Abelardo Diesel M.D.   On: 06/20/2019 15:23   US Scrotum W/doppler  Result Date: 06/20/2019 CLINICAL DATA:  Left testicular pain EXAM: SCROTAL ULTRASOUND DOPPLER ULTRASOUND OF THE TESTICLES TECHNIQUE: Complete ultrasound examination of the testicles, epididymis, and other scrotal structures was performed. Color  and spectral Doppler ultrasound were also utilized to evaluate blood flow to the testicles. COMPARISON:  02/13/2019 FINDINGS: Right testicle Measurements: 4.3 x 2.2 x 3.4 cm. Redemonstration of 4 mm simple intraparenchymal cyst. No solid mass or microlithiasis visualized. Left testicle Measurements: 3.7 x 2.2 x 2.6 cm. Redemonstration of 4 mm simple intraparenchymal cyst. No solid mass  or microlithiasis visualized. Right epididymis:  Normal in size and appearance. Left epididymis:  Normal in size and appearance. Hydrocele:  Small right and trace left hydrocele. Varicocele:  Right-sided varicocele. Pulsed Doppler interrogation of both testes demonstrates normal low resistance arterial and venous waveforms bilaterally. IMPRESSION: 1. Negative for testicular torsion. 2. Small right and trace left hydrocele. 3. Right varicocele. Electronically Signed   By: Davina Poke M.D.   On: 06/20/2019 17:19    Work-up today shows no evidence of acute surgical abdominal pathology.  UA does not suggest UTI or nephrolithiasis.  Ultrasound shows no evidence of epididymitis, orchitis, or abscess.  He has bilateral hydroceles and right varicocele.  No evidence of incarcerated hernia on imaging today.  On reevaluation patient resting comfortably in no apparent distress.  His pain is been managed well in the ED.  Recommend follow-up with general surgery for evaluation of inguinal hernia, urology for evaluation of testicular pain.  Will discharge with anti-inflammatories, instructions for light duty at work until follow-up with general surgery.  He was given strict ED return precautions.  Patient and daughter verbalized understanding of and agreement with plan and patient stable for discharge home at this time.     Renita Papa, PA-C 06/20/19 1752    Davonna Belling, MD 06/20/19 2328

## 2019-07-01 ENCOUNTER — Other Ambulatory Visit: Payer: Self-pay | Admitting: General Surgery

## 2019-07-01 DIAGNOSIS — R1032 Left lower quadrant pain: Secondary | ICD-10-CM

## 2019-07-19 ENCOUNTER — Ambulatory Visit
Admission: RE | Admit: 2019-07-19 | Discharge: 2019-07-19 | Disposition: A | Discharge status: Home / Self Care | Payer: BC Managed Care – PPO | Source: Ambulatory Visit | Attending: General Surgery | Admitting: General Surgery

## 2019-07-19 ENCOUNTER — Other Ambulatory Visit: Payer: Self-pay

## 2019-07-19 DIAGNOSIS — R1032 Left lower quadrant pain: Secondary | ICD-10-CM

## 2019-07-19 MED ORDER — GADOBENATE DIMEGLUMINE 529 MG/ML IV SOLN
10.0000 mL | Freq: Once | INTRAVENOUS | Status: AC | PRN
  Administered 2019-07-19: 10 mL via INTRAVENOUS

## 2019-07-28 ENCOUNTER — Other Ambulatory Visit: Payer: Self-pay | Admitting: Sports Medicine

## 2019-07-28 DIAGNOSIS — M545 Low back pain, unspecified: Secondary | ICD-10-CM

## 2019-07-28 DIAGNOSIS — G8929 Other chronic pain: Secondary | ICD-10-CM

## 2019-07-31 ENCOUNTER — Ambulatory Visit
Admission: RE | Admit: 2019-07-31 | Discharge: 2019-07-31 | Disposition: A | Discharge status: Home / Self Care | Payer: BC Managed Care – PPO | Source: Ambulatory Visit | Attending: Sports Medicine | Admitting: Sports Medicine

## 2019-07-31 ENCOUNTER — Other Ambulatory Visit: Payer: Self-pay

## 2019-07-31 DIAGNOSIS — G8929 Other chronic pain: Secondary | ICD-10-CM

## 2019-07-31 MED ORDER — METHYLPREDNISOLONE ACETATE 40 MG/ML INJ SUSP (RADIOLOG
120.0000 mg | Freq: Once | INTRAMUSCULAR | Status: AC
  Administered 2019-07-31: 120 mg via EPIDURAL

## 2019-07-31 MED ORDER — IOPAMIDOL (ISOVUE-M 200) INJECTION 41%
1.0000 mL | Freq: Once | INTRAMUSCULAR | Status: AC
  Administered 2019-07-31: 1 mL via EPIDURAL

## 2019-07-31 NOTE — Discharge Instructions (Signed)

## 2019-08-25 ENCOUNTER — Other Ambulatory Visit: Payer: Self-pay | Admitting: Sports Medicine

## 2019-08-25 DIAGNOSIS — G8929 Other chronic pain: Secondary | ICD-10-CM

## 2019-08-28 ENCOUNTER — Ambulatory Visit
Admission: RE | Admit: 2019-08-28 | Discharge: 2019-08-28 | Disposition: A | Discharge status: Home / Self Care | Payer: BC Managed Care – PPO | Source: Ambulatory Visit | Attending: Sports Medicine | Admitting: Sports Medicine

## 2019-08-28 ENCOUNTER — Other Ambulatory Visit: Payer: Self-pay

## 2019-08-28 DIAGNOSIS — G8929 Other chronic pain: Secondary | ICD-10-CM

## 2019-08-28 MED ORDER — METHYLPREDNISOLONE ACETATE 40 MG/ML INJ SUSP (RADIOLOG
120.0000 mg | Freq: Once | INTRAMUSCULAR | Status: AC
  Administered 2019-08-28: 120 mg via EPIDURAL

## 2019-08-28 MED ORDER — IOPAMIDOL (ISOVUE-M 200) INJECTION 41%
1.0000 mL | Freq: Once | INTRAMUSCULAR | Status: AC
  Administered 2019-08-28: 12:00:00 1 mL via EPIDURAL

## 2019-08-28 NOTE — Discharge Instructions (Signed)

## 2019-09-14 ENCOUNTER — Emergency Department (HOSPITAL_COMMUNITY)
Admission: EM | Admit: 2019-09-14 | Discharge: 2019-09-14 | Disposition: A | Discharge status: Home / Self Care | Payer: BC Managed Care – PPO | Attending: Emergency Medicine | Admitting: Emergency Medicine

## 2019-09-14 ENCOUNTER — Other Ambulatory Visit: Payer: Self-pay

## 2019-09-14 ENCOUNTER — Encounter (HOSPITAL_COMMUNITY): Payer: Self-pay | Admitting: Emergency Medicine

## 2019-09-14 DIAGNOSIS — Z5321 Procedure and treatment not carried out due to patient leaving prior to being seen by health care provider: Secondary | ICD-10-CM | POA: Insufficient documentation

## 2019-09-14 DIAGNOSIS — R1084 Generalized abdominal pain: Secondary | ICD-10-CM | POA: Insufficient documentation

## 2019-09-14 LAB — COMPREHENSIVE METABOLIC PANEL
ALT: 21 U/L (ref 0–44)
AST: 21 U/L (ref 15–41)
Albumin: 3.7 g/dL (ref 3.5–5.0)
Alkaline Phosphatase: 72 U/L (ref 38–126)
Anion gap: 8 (ref 5–15)
BUN: 12 mg/dL (ref 8–23)
CO2: 26 mmol/L (ref 22–32)
Calcium: 9 mg/dL (ref 8.9–10.3)
Chloride: 109 mmol/L (ref 98–111)
Creatinine, Ser: 0.86 mg/dL (ref 0.61–1.24)
GFR calc Af Amer: >60 mL/min (ref >60)
GFR calc non Af Amer: >60 mL/min (ref >60)
Glucose, Bld: 91 mg/dL (ref 70–99)
Potassium: 3.9 mmol/L (ref 3.5–5.1)
Sodium: 143 mmol/L (ref 135–145)
Total Bilirubin: 0.8 mg/dL (ref 0.3–1.2)
Total Protein: 7.2 g/dL (ref 6.5–8.1)

## 2019-09-14 LAB — CBC
HCT: 45.4 % (ref 39.0–52.0)
Hemoglobin: 15.3 g/dL (ref 13.0–17.0)
MCH: 36.1 pg — ABNORMAL HIGH (ref 26.0–34.0)
MCHC: 33.7 g/dL (ref 30.0–36.0)
MCV: 107.1 fL — ABNORMAL HIGH (ref 80.0–100.0)
Platelets: 293 10*3/uL (ref 150–400)
RBC: 4.24 MIL/uL (ref 4.22–5.81)
RDW: 14.2 % (ref 11.5–15.5)
WBC: 7.1 10*3/uL (ref 4.0–10.5)
nRBC: 0 % (ref 0.0–0.2)

## 2019-09-14 LAB — LIPASE, BLOOD: Lipase: 24 U/L (ref 11–51)

## 2019-09-14 MED ORDER — SODIUM CHLORIDE 0.9% FLUSH: 3.0000 mL | Freq: Once | INTRAVENOUS | Status: DC

## 2019-09-14 NOTE — ED Notes (Signed)
Pt stated that he does not want to wait anymore and that he will be leaving.

## 2019-09-14 NOTE — ED Triage Notes (Signed)
C/o generalized abd pain with nausea since this morning.

## 2019-10-06 ENCOUNTER — Encounter (HOSPITAL_COMMUNITY): Payer: Self-pay

## 2019-10-06 ENCOUNTER — Ambulatory Visit (HOSPITAL_COMMUNITY)
Admission: EM | Admit: 2019-10-06 | Discharge: 2019-10-06 | Disposition: A | Discharge status: Home / Self Care | Payer: BC Managed Care – PPO | Attending: Family Medicine | Admitting: Family Medicine

## 2019-10-06 ENCOUNTER — Other Ambulatory Visit: Payer: Self-pay

## 2019-10-06 DIAGNOSIS — S60454A Superficial foreign body of right ring finger, initial encounter: Secondary | ICD-10-CM

## 2019-10-06 DIAGNOSIS — Z23 Encounter for immunization: Secondary | ICD-10-CM | POA: Diagnosis not present

## 2019-10-06 DIAGNOSIS — T148XXA Other injury of unspecified body region, initial encounter: Secondary | ICD-10-CM

## 2019-10-06 MED ORDER — CEPHALEXIN 500 MG PO CAPS: 500.0000 mg | ORAL_CAPSULE | Freq: Two times a day (BID) | ORAL | 0 refills | Status: DC

## 2019-10-06 MED ORDER — TETANUS-DIPHTH-ACELL PERTUSSIS 5-2.5-18.5 LF-MCG/0.5 IM SUSP
0.5000 mL | Freq: Once | INTRAMUSCULAR | Status: AC
  Administered 2019-10-06: 20:00:00 0.5 mL via INTRAMUSCULAR

## 2019-10-06 MED ORDER — TETANUS-DIPHTH-ACELL PERTUSSIS 5-2.5-18.5 LF-MCG/0.5 IM SUSP
INTRAMUSCULAR | Status: AC
  Filled 2019-10-06: qty 0.5

## 2019-10-06 NOTE — ED Provider Notes (Signed)
[place]    CSN: [id number] Arrival date & time: 10/06/19  1757      History   Chief Complaint Chief Complaint  Patient presents with  . Foreign Body in Skin    HPI Travis Lutz is a 64 y.o. male.   HPI  Patient is here for splinter in his hand.  He states that he was sweeping his floor with a wooden handled broom.  The splinter from the wooden handle went into his finger.  He has been unable to get it out.  Now he has swelling, pain, and diminished movement of the finger.  Is been present about a week No fever or chills.  He is otherwise healthy  History reviewed. No pertinent past medical history.  Patient Active Problem List   Diagnosis Date Noted  . Dyspnea 02/29/2016  . Chronic low back pain 12/06/2015  . Bilateral chronic knee pain 12/06/2015  . Foot pain, bilateral 12/06/2015  . Vitamin D deficiency 11/16/2015  . Migraine headache 11/15/2015  . Testicular pain 11/15/2015  . 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 Homeless single person 11/15/2015  . Smoker 11/15/2015  . Alcohol use disorder 11/15/2015    Past Surgical History:  Procedure Laterality Date  . REPAIR OF PERFORATED ULCER         Home Medications    Prior to Admission medications   Medication Sig Start Date End Date Taking? Authorizing Provider  albuterol (PROVENTIL HFA;VENTOLIN HFA) 108 (90 Base) MCG/ACT inhaler Inhale 1-2 puffs into the lungs every 6 (six) hours as needed for wheezing or shortness of breath. 03/02/16   Funches, Josalyn, MD  cephALEXin (KEFLEX) 500 MG capsule Take 1 capsule (500 mg total) by mouth 2 (two) times daily. 10/06/19   Raylene Everts, MD    Family History Family History  Family history unknown: Yes    Social History Social History   Tobacco Use  . Smoking status: Current Every Day Smoker    Packs/day: 0.25    Types: Cigarettes  . Smokeless tobacco: Never Used  Substance Use Topics  . Alcohol use: Yes    Alcohol/week:  0.0 standard drinks    Comment: beer daily   . Drug use: Yes    Types: Marijuana     Allergies   Patient has no known allergies.   Review of Systems Review of Systems  Skin: Positive for wound.     Physical Exam Triage Vital Signs ED Triage Vitals  Enc Vitals Group     BP 10/06/19 1843 (!) 140/92     Pulse Rate 10/06/19 1843 84     Resp 10/06/19 1843 16     Temp 10/06/19 1843 98.5 F (36.9 C)     Temp Source 10/06/19 1843 Oral     SpO2 10/06/19 1843 100 %     Weight --      Height --      Head Circumference --      Peak Flow --      Pain Score 10/06/19 1842 8     Pain Loc --      Pain Edu? --      Excl. in [place]? --    No data found.  Updated Vital Signs BP (!) 140/92 (BP Location: Right Arm)   Pulse 84   Temp 98.5 F (36.9 C) (Oral)   Resp 16   SpO2 100%   Visual Acuity Right Eye Distance:   Left Eye Distance:  Bilateral Distance:    Right Eye Near:   Left Eye Near:    Bilateral Near:     Physical Exam Constitutional:      General: He is not in acute distress.    Appearance: He is well-developed.  HENT:     Head: Normocephalic and atraumatic.  Eyes:     Conjunctiva/sclera: Conjunctivae normal.     Pupils: Pupils are equal, round, and reactive to light.  Cardiovascular:     Rate and Rhythm: Normal rate.  Pulmonary:     Effort: Pulmonary effort is normal. No respiratory distress.  Abdominal:     General: There is distension.  Musculoskeletal:        General: Normal range of motion.     Cervical back: Normal range of motion.  Skin:    General: Skin is warm and dry.     Comments: Palm of right hand has swelling over the proximal phalanx of the ring finger.  Thickened skin.  Very tender.  Neurological:     Mental Status: He is alert.      UC Treatments / Results  Labs (all labs ordered are listed, but only abnormal results are displayed) Labs Reviewed - No data to display  EKG   Radiology No results found.  Procedures Procedures  (including critical care time)  Medications Ordered in UC Medications  Tdap (BOOSTRIX) injection 0.5 mL (0.5 mLs Intramuscular Given 10/06/19 1938)    Initial Impression / Assessment and Plan / UC Course  I have reviewed the triage vital signs and the nursing notes.  Pertinent labs & imaging results that were available during my care of the patient were reviewed by me and considered in my medical decision making (see chart for details).     Area was soaked in warm water.  I took a 15 blade and shaved off the top layer of skin.  From there I could see a punctate opening.  The splinter popped through with a lot of purulence.  Band-Aid was placed. Final Clinical Impressions(s) / UC Diagnoses   Final diagnoses:  Splinter in skin     Discharge Instructions     Take antibiotic 2 times a day for 5 days Continue warm soaks diet Return as needed   ED Prescriptions    Medication Sig Dispense Auth. Provider   cephALEXin (KEFLEX) 500 MG capsule Take 1 capsule (500 mg total) by mouth 2 (two) times daily. 10 capsule Raylene Everts, MD     PDMP not reviewed this encounter.   Raylene Everts, MD 10/06/19 2014

## 2019-10-06 NOTE — ED Triage Notes (Signed)
Patient presents to Urgent Care with complaints of possible splinter in his right ring finger since about a week ago. Patient reports it is swollen and makes the whole finger hurt.

## 2019-10-06 NOTE — Discharge Instructions (Addendum)
Take antibiotic 2 times a day for 5 days Continue warm soaks diet Return as needed

## 2019-10-15 ENCOUNTER — Other Ambulatory Visit: Payer: Self-pay

## 2019-10-15 ENCOUNTER — Ambulatory Visit (HOSPITAL_COMMUNITY)
Admission: EM | Admit: 2019-10-15 | Discharge: 2019-10-15 | Disposition: A | Discharge status: Home / Self Care | Payer: BC Managed Care – PPO | Attending: Family Medicine | Admitting: Family Medicine

## 2019-10-15 ENCOUNTER — Encounter (HOSPITAL_COMMUNITY): Payer: Self-pay | Admitting: Emergency Medicine

## 2019-10-15 ENCOUNTER — Ambulatory Visit (INDEPENDENT_AMBULATORY_CARE_PROVIDER_SITE_OTHER): Payer: BC Managed Care – PPO

## 2019-10-15 DIAGNOSIS — R102 Pelvic and perineal pain: Secondary | ICD-10-CM | POA: Diagnosis present

## 2019-10-15 DIAGNOSIS — R140 Abdominal distension (gaseous): Secondary | ICD-10-CM | POA: Diagnosis not present

## 2019-10-15 DIAGNOSIS — R109 Unspecified abdominal pain: Secondary | ICD-10-CM

## 2019-10-15 LAB — POCT URINALYSIS DIP (DEVICE)
Bilirubin Urine: NEGATIVE
Glucose, UA: NEGATIVE mg/dL
Ketones, ur: NEGATIVE mg/dL
Nitrite: NEGATIVE
Protein, ur: NEGATIVE mg/dL
Specific Gravity, Urine: 1.025 (ref 1.005–1.030)
Urobilinogen, UA: 0.2 mg/dL (ref 0.0–1.0)
pH: 7 (ref 5.0–8.0)

## 2019-10-15 MED ORDER — MORPHINE SULFATE (PF) 2 MG/ML IV SOLN
4.0000 mg | Freq: Once | INTRAVENOUS | Status: AC
  Administered 2019-10-15: 4 mg via INTRAMUSCULAR

## 2019-10-15 MED ORDER — SULFAMETHOXAZOLE-TRIMETHOPRIM 800-160 MG PO TABS: 1.0000 | ORAL_TABLET | Freq: Two times a day (BID) | ORAL | 0 refills | Status: AC

## 2019-10-15 MED ORDER — CYCLOBENZAPRINE HCL 10 MG PO TABS: 10.0000 mg | ORAL_TABLET | Freq: Two times a day (BID) | ORAL | 0 refills | Status: DC | PRN

## 2019-10-15 MED ORDER — MORPHINE SULFATE (PF) 4 MG/ML IV SOLN
INTRAVENOUS | Status: AC
  Filled 2019-10-15: qty 1

## 2019-10-15 MED ORDER — ACETAMINOPHEN 500 MG PO TABS: 500.0000 mg | ORAL_TABLET | Freq: Four times a day (QID) | ORAL | 0 refills | Status: DC | PRN

## 2019-10-15 NOTE — ED Provider Notes (Signed)
[place]    CSN: [id number] Arrival date & time: 10/15/19  [id number]      History   Chief Complaint Chief Complaint  Patient presents with  . Groin Pain    HPI Travis Lutz is a 64 y.o. male.   Patient is a 64 year old male with past medical history of chronic back pain chronic knee pain, testicular pain, homeless, alcohol use.  He presents today with lower abdominal pain, swelling.  Symptoms have been constant and worsening since yesterday.  Describes the pain as moderate to severe.  Reports having regular bowel movements and no issues with urination.  Denies any dysuria, hematuria.  Denies any fever, nausea, vomiting. No testicle pain or swelling. Has been doing a lot of heavy lifting recently at work. No falls or injuries.   ROS per HPI      History reviewed. No pertinent past medical history.  Patient Active Problem List   Diagnosis Date Noted  . Dyspnea 02/29/2016  . Chronic low back pain 12/06/2015  . Bilateral chronic knee pain 12/06/2015  . Foot pain, bilateral 12/06/2015  . Vitamin D deficiency 11/16/2015  . Migraine headache 11/15/2015  . Testicular pain 11/15/2015  . 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 Homeless single person 11/15/2015  . Smoker 11/15/2015  . Alcohol use disorder 11/15/2015    Past Surgical History:  Procedure Laterality Date  . REPAIR OF PERFORATED ULCER         Home Medications    Prior to Admission medications   Medication Sig Start Date End Date Taking? Authorizing Provider  acetaminophen (TYLENOL) 500 MG tablet Take 1 tablet (500 mg total) by mouth every 6 (six) hours as needed. 10/15/19   Orvan July, NP  albuterol (PROVENTIL HFA;VENTOLIN HFA) 108 (90 Base) MCG/ACT inhaler Inhale 1-2 puffs into the lungs every 6 (six) hours as needed for wheezing or shortness of breath. 03/02/16   Funches, Adriana Mccallum, MD  cyclobenzaprine (FLEXERIL) 10 MG tablet Take 1 tablet (10 mg total) by mouth 2 (two) times  daily as needed for muscle spasms. 10/15/19   Loura Halt A, NP  sulfamethoxazole-trimethoprim (BACTRIM DS) 800-160 MG tablet Take 1 tablet by mouth 2 (two) times daily for 7 days. 10/15/19 10/22/19  Orvan July, NP    Family History Family History  Family history unknown: Yes    Social History Social History   Tobacco Use  . Smoking status: Current Every Day Smoker    Packs/day: 0.25    Types: Cigarettes  . Smokeless tobacco: Never Used  Substance Use Topics  . Alcohol use: Yes    Alcohol/week: 0.0 standard drinks    Comment: beer daily   . Drug use: Yes    Types: Marijuana     Allergies   Patient has no known allergies.   Review of Systems Review of Systems   Physical Exam Triage Vital Signs ED Triage Vitals  Enc Vitals Group     BP 10/15/19 1027 (!) 147/87     Pulse Rate 10/15/19 1027 84     Resp 10/15/19 1027 (!) 22     Temp 10/15/19 1027 98.9 F (37.2 C)     Temp Source 10/15/19 1027 Oral     SpO2 10/15/19 1027 100 %     Weight --      Height --      Head Circumference --      Peak Flow --      Pain Score 10/15/19  1024 10     Pain Loc --      Pain Edu? --      Excl. in [place]? --    No data found.  Updated Vital Signs BP (!) 147/87 (BP Location: Left Arm)   Pulse 84   Temp 98.9 F (37.2 C) (Oral)   Resp (!) 22   SpO2 100%   Visual Acuity Right Eye Distance:   Left Eye Distance:   Bilateral Distance:    Right Eye Near:   Left Eye Near:    Bilateral Near:     Physical Exam Vitals and nursing note reviewed.  Constitutional:      Comments: Appears in pain, tearful   HENT:     Head: Normocephalic and atraumatic.     Nose: Nose normal.  Pulmonary:     Effort: Pulmonary effort is normal.  Abdominal:     General: There is distension.     Tenderness: There is abdominal tenderness. There is no guarding or rebound.       Comments: Lower abdominal and suprapubic tenderness with mild distension.  Tender along the lower abdominal muscles     Musculoskeletal:     Cervical back: Normal range of motion.  Skin:    General: Skin is warm and dry.  Neurological:     Mental Status: He is alert.      UC Treatments / Results  Labs (all labs ordered are listed, but only abnormal results are displayed) Labs Reviewed  POCT URINALYSIS DIP (DEVICE) - Abnormal; Notable for the following components:      Result Value   Hgb urine dipstick TRACE (*)    Leukocytes,Ua TRACE (*)    All other components within normal limits  URINE CULTURE    EKG   Radiology DG Abd 2 Views  Result Date: 10/15/2019 CLINICAL DATA:  Abdominal pain and distension EXAM: ABDOMEN - 2 VIEW COMPARISON:  07/24/2019 FINDINGS: Gas in nondilated large and small bowel loops. Negative for obstruction or mass. Negative for free intraperitoneal air. No abnormal calcification AVN right femoral head. IMPRESSION: Normal bowel gas pattern. Chronic AVN right hip Electronically Signed   By: Franchot Gallo M.D.   On: 10/15/2019 11:26    Procedures Procedures (including critical care time)  Medications Ordered in UC Medications  morphine 2 MG/ML injection 4 mg (4 mg Intramuscular Given 10/15/19 1103)    Initial Impression / Assessment and Plan / UC Course  I have reviewed the triage vital signs and the nursing notes.  Pertinent labs & imaging results that were available during my care of the patient were reviewed by me and considered in my medical decision making (see chart for details).     64 year old male with lower abdominal/suprapubic discomfort, mild swelling. Pt tearful upon initial exam here. Could not do a proper exam until he received pain  medication. Once pt pain was improved was able to do exam and pt tender without rebound or guarding. Abdomen soft. Tender along the abdominal muscles. Pt reports that he has been doing a lot of heavy lifting recently and feels the pain more with movement and lifting.  X ray without any acute abnormalities.  Urine did show  trace lueks and blood, will cover for UTI pending culture.  Do not believe this is urinary retention.  Patient is having no problems with urination. Patient walked out with no difficulty. VSS and he is non toxic or ill appearing.  Prescribed Flexeril for muscle relaxant to see  if this may help.  Tylenol extra strength as needed. Recommended if symptoms return or worsen he will need to go to the ER.  Patient understanding and agree. Final Clinical Impressions(s) / UC Diagnoses   Final diagnoses:  Suprapubic pain     Discharge Instructions     We will go ahead and treat you for a urinary infection.  This could also be a strained abdominal muscle.  Flexeril for muscle relaxant and tylenol extra strength for pain.  Drink plenty of water.  If your symptoms worsen you need to go to the ER      ED Prescriptions    Medication Sig Dispense Auth. Provider   sulfamethoxazole-trimethoprim (BACTRIM DS) 800-160 MG tablet Take 1 tablet by mouth 2 (two) times daily for 7 days. 14 tablet Allysia Ingles A, NP   acetaminophen (TYLENOL) 500 MG tablet Take 1 tablet (500 mg total) by mouth every 6 (six) hours as needed. 30 tablet Ellice Boultinghouse A, NP   cyclobenzaprine (FLEXERIL) 10 MG tablet Take 1 tablet (10 mg total) by mouth 2 (two) times daily as needed for muscle spasms. 20 tablet Loura Halt A, NP     PDMP not reviewed this encounter.   Orvan July, NP 10/16/19 1336

## 2019-10-15 NOTE — ED Notes (Signed)
I went to get patient for the 2 view Abdominal, he stated he was to get pain meds before going to x-ray. He wanted to wait till medication was given

## 2019-10-15 NOTE — Discharge Instructions (Addendum)
We will go ahead and treat you for a urinary infection.  This could also be a strained abdominal muscle.  Flexeril for muscle relaxant and tylenol extra strength for pain.  Drink plenty of water.  If your symptoms worsen you need to go to the ER

## 2019-10-15 NOTE — ED Triage Notes (Signed)
patient  reports groin pain and swelling.  History of this.  Poor historian.  Patient reports he is able to urinate.

## 2019-10-16 LAB — URINE CULTURE: Culture: NO GROWTH

## 2019-10-24 ENCOUNTER — Encounter (HOSPITAL_COMMUNITY): Payer: Self-pay

## 2019-10-24 ENCOUNTER — Other Ambulatory Visit: Payer: Self-pay

## 2019-10-24 ENCOUNTER — Ambulatory Visit (HOSPITAL_COMMUNITY)
Admission: EM | Admit: 2019-10-24 | Discharge: 2019-10-24 | Disposition: A | Discharge status: Home / Self Care | Payer: BC Managed Care – PPO | Attending: Family Medicine | Admitting: Family Medicine

## 2019-10-24 DIAGNOSIS — B349 Viral infection, unspecified: Secondary | ICD-10-CM | POA: Diagnosis present

## 2019-10-24 DIAGNOSIS — J3489 Other specified disorders of nose and nasal sinuses: Secondary | ICD-10-CM | POA: Insufficient documentation

## 2019-10-24 DIAGNOSIS — R05 Cough: Secondary | ICD-10-CM | POA: Diagnosis present

## 2019-10-24 DIAGNOSIS — Z79899 Other long term (current) drug therapy: Secondary | ICD-10-CM | POA: Insufficient documentation

## 2019-10-24 DIAGNOSIS — Z20822 Contact with and (suspected) exposure to covid-19: Secondary | ICD-10-CM | POA: Diagnosis not present

## 2019-10-24 DIAGNOSIS — R519 Headache, unspecified: Secondary | ICD-10-CM | POA: Diagnosis not present

## 2019-10-24 DIAGNOSIS — Z7952 Long term (current) use of systemic steroids: Secondary | ICD-10-CM | POA: Insufficient documentation

## 2019-10-24 DIAGNOSIS — F17210 Nicotine dependence, cigarettes, uncomplicated: Secondary | ICD-10-CM | POA: Insufficient documentation

## 2019-10-24 DIAGNOSIS — R52 Pain, unspecified: Secondary | ICD-10-CM

## 2019-10-24 DIAGNOSIS — Z791 Long term (current) use of non-steroidal anti-inflammatories (NSAID): Secondary | ICD-10-CM | POA: Diagnosis not present

## 2019-10-24 DIAGNOSIS — R059 Cough, unspecified: Secondary | ICD-10-CM

## 2019-10-24 LAB — POC SARS CORONAVIRUS 2 AG: SARS Coronavirus 2 Ag: NEGATIVE

## 2019-10-24 LAB — POC SARS CORONAVIRUS 2 AG -  ED: SARS Coronavirus 2 Ag: NEGATIVE

## 2019-10-24 MED ORDER — BENZONATATE 100 MG PO CAPS: 100.0000 mg | ORAL_CAPSULE | Freq: Three times a day (TID) | ORAL | 0 refills | Status: DC | PRN

## 2019-10-24 MED ORDER — PSEUDOEPHEDRINE HCL 30 MG PO TABS: 30.0000 mg | ORAL_TABLET | Freq: Three times a day (TID) | ORAL | 0 refills | Status: DC | PRN

## 2019-10-24 MED ORDER — CETIRIZINE HCL 10 MG PO TABS: 10.0000 mg | ORAL_TABLET | Freq: Every day | ORAL | 0 refills | Status: DC

## 2019-10-24 MED ORDER — ACETAMINOPHEN 325 MG PO TABS
650.0000 mg | ORAL_TABLET | Freq: Once | ORAL | Status: AC
  Administered 2019-10-24: 10:00:00 650 mg via ORAL

## 2019-10-24 MED ORDER — PROMETHAZINE-DM 6.25-15 MG/5ML PO SYRP: 5.0000 mL | ORAL_SOLUTION | Freq: Every evening | ORAL | 0 refills | Status: DC | PRN

## 2019-10-24 MED ORDER — ACETAMINOPHEN 325 MG PO TABS
ORAL_TABLET | ORAL | Status: AC
  Filled 2019-10-24: qty 2

## 2019-10-24 NOTE — ED Triage Notes (Signed)
Pt is here with body aches, chills, vomiting since Tuesday. States he has NOT taken any meds to relieve discomfort.

## 2019-10-24 NOTE — Discharge Instructions (Signed)
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
## 2019-10-26 LAB — NOVEL CORONAVIRUS, NAA (HOSP ORDER, SEND-OUT TO REF LAB; TAT 18-24 HRS): SARS-CoV-2, NAA: NOT DETECTED

## 2019-10-29 ENCOUNTER — Encounter (HOSPITAL_COMMUNITY): Payer: Self-pay

## 2019-10-29 ENCOUNTER — Encounter (HOSPITAL_COMMUNITY): Payer: Self-pay | Admitting: *Deleted

## 2019-10-29 ENCOUNTER — Ambulatory Visit (INDEPENDENT_AMBULATORY_CARE_PROVIDER_SITE_OTHER): Payer: BC Managed Care – PPO

## 2019-10-29 ENCOUNTER — Other Ambulatory Visit: Payer: Self-pay

## 2019-10-29 ENCOUNTER — Ambulatory Visit (INDEPENDENT_AMBULATORY_CARE_PROVIDER_SITE_OTHER)
Admission: EM | Admit: 2019-10-29 | Discharge: 2019-10-29 | Disposition: A | Discharge status: Other Acute Inpatient Hospital | Payer: BC Managed Care – PPO | Source: Home / Self Care

## 2019-10-29 ENCOUNTER — Emergency Department (HOSPITAL_COMMUNITY)
Admission: EM | Admit: 2019-10-29 | Discharge: 2019-10-30 | Disposition: A | Discharge status: Home / Self Care | Payer: BC Managed Care – PPO | Attending: Emergency Medicine | Admitting: Emergency Medicine

## 2019-10-29 DIAGNOSIS — R6883 Chills (without fever): Secondary | ICD-10-CM | POA: Diagnosis not present

## 2019-10-29 DIAGNOSIS — R000 Tachycardia, unspecified: Secondary | ICD-10-CM | POA: Insufficient documentation

## 2019-10-29 DIAGNOSIS — R05 Cough: Secondary | ICD-10-CM

## 2019-10-29 DIAGNOSIS — Z20822 Contact with and (suspected) exposure to covid-19: Secondary | ICD-10-CM | POA: Insufficient documentation

## 2019-10-29 DIAGNOSIS — R110 Nausea: Secondary | ICD-10-CM | POA: Insufficient documentation

## 2019-10-29 DIAGNOSIS — J111 Influenza due to unidentified influenza virus with other respiratory manifestations: Secondary | ICD-10-CM | POA: Diagnosis not present

## 2019-10-29 DIAGNOSIS — F17210 Nicotine dependence, cigarettes, uncomplicated: Secondary | ICD-10-CM | POA: Diagnosis not present

## 2019-10-29 DIAGNOSIS — R509 Fever, unspecified: Secondary | ICD-10-CM

## 2019-10-29 DIAGNOSIS — N390 Urinary tract infection, site not specified: Secondary | ICD-10-CM | POA: Diagnosis not present

## 2019-10-29 DIAGNOSIS — Z79899 Other long term (current) drug therapy: Secondary | ICD-10-CM | POA: Diagnosis not present

## 2019-10-29 DIAGNOSIS — M791 Myalgia, unspecified site: Secondary | ICD-10-CM | POA: Diagnosis not present

## 2019-10-29 LAB — CBC
HCT: 45.3 % (ref 39.0–52.0)
Hemoglobin: 15.2 g/dL (ref 13.0–17.0)
MCH: 35.1 pg — ABNORMAL HIGH (ref 26.0–34.0)
MCHC: 33.6 g/dL (ref 30.0–36.0)
MCV: 104.6 fL — ABNORMAL HIGH (ref 80.0–100.0)
Platelets: 356 10*3/uL (ref 150–400)
RBC: 4.33 MIL/uL (ref 4.22–5.81)
RDW: 14.1 % (ref 11.5–15.5)
WBC: 11.3 10*3/uL — ABNORMAL HIGH (ref 4.0–10.5)
nRBC: 0 % (ref 0.0–0.2)

## 2019-10-29 LAB — URINALYSIS, ROUTINE W REFLEX MICROSCOPIC
Bilirubin Urine: NEGATIVE
Glucose, UA: NEGATIVE mg/dL
Ketones, ur: NEGATIVE mg/dL
Nitrite: NEGATIVE
Protein, ur: 30 mg/dL — AB
Specific Gravity, Urine: 1.026 (ref 1.005–1.030)
WBC, UA: >50 WBC/hpf — ABNORMAL HIGH (ref 0–5)
pH: 5 (ref 5.0–8.0)

## 2019-10-29 LAB — LIPASE, BLOOD: Lipase: 19 U/L (ref 11–51)

## 2019-10-29 LAB — COMPREHENSIVE METABOLIC PANEL
ALT: 24 U/L (ref 0–44)
AST: 21 U/L (ref 15–41)
Albumin: 3.6 g/dL (ref 3.5–5.0)
Alkaline Phosphatase: 73 U/L (ref 38–126)
Anion gap: 13 (ref 5–15)
BUN: 9 mg/dL (ref 8–23)
CO2: 23 mmol/L (ref 22–32)
Calcium: 9.5 mg/dL (ref 8.9–10.3)
Chloride: 98 mmol/L (ref 98–111)
Creatinine, Ser: 1.05 mg/dL (ref 0.61–1.24)
GFR calc Af Amer: >60 mL/min (ref >60)
GFR calc non Af Amer: >60 mL/min (ref >60)
Glucose, Bld: 106 mg/dL — ABNORMAL HIGH (ref 70–99)
Potassium: 4.7 mmol/L (ref 3.5–5.1)
Sodium: 134 mmol/L — ABNORMAL LOW (ref 135–145)
Total Bilirubin: 0.9 mg/dL (ref 0.3–1.2)
Total Protein: 7.9 g/dL (ref 6.5–8.1)

## 2019-10-29 LAB — POC SARS CORONAVIRUS 2 AG -  ED: SARS Coronavirus 2 Ag: NEGATIVE

## 2019-10-29 LAB — POC SARS CORONAVIRUS 2 AG: SARS Coronavirus 2 Ag: NEGATIVE

## 2019-10-29 LAB — POC INFLUENZA A AND B ANTIGEN (URGENT CARE ONLY)
Influenza A Ag: NEGATIVE
Influenza B Ag: NEGATIVE

## 2019-10-29 LAB — INFLUENZA A AND B ANTIGEN (CONVERTED LAB)
Influenza A Ag: NEGATIVE
Influenza B Ag: NEGATIVE

## 2019-10-29 MED ORDER — SODIUM CHLORIDE 0.9% FLUSH: 3.0000 mL | Freq: Once | INTRAVENOUS | Status: DC

## 2019-10-29 MED ORDER — ONDANSETRON 4 MG PO TBDP
ORAL_TABLET | ORAL | Status: AC
  Filled 2019-10-29: qty 1

## 2019-10-29 MED ORDER — ACETAMINOPHEN 325 MG PO TABS
ORAL_TABLET | ORAL | Status: AC
  Filled 2019-10-29: qty 2

## 2019-10-29 MED ORDER — ONDANSETRON 4 MG PO TBDP
4.0000 mg | ORAL_TABLET | Freq: Once | ORAL | Status: AC
  Administered 2019-10-29: 16:00:00 4 mg via ORAL

## 2019-10-29 MED ORDER — ACETAMINOPHEN 325 MG PO TABS
650.0000 mg | ORAL_TABLET | Freq: Once | ORAL | Status: AC
  Administered 2019-10-29: 650 mg via ORAL

## 2019-10-29 NOTE — ED Triage Notes (Signed)
The pt reports that he had a chest xray and had another covid swab done at [facility]

## 2019-10-29 NOTE — ED Triage Notes (Signed)
Pt presents with chills, generalized body aches, weakness, headache, and vomiting X 1 week.

## 2019-10-29 NOTE — ED Notes (Signed)
Patient is being discharged from the Urgent [place] and sent to the Emergency Department via wheelchair by significant other. Per provider Faustino Congress, patient is stable but in need of higher level of care due to possible PE. Patient is aware and verbalizes understanding of plan of care.   Vitals:   10/29/19 1536  BP: 130/77  Pulse: (!) 103  Resp: (!) 22  Temp: (!) 100.5 F (38.1 C)  SpO2: 100%

## 2019-10-29 NOTE — ED Provider Notes (Signed)
[place] [facility]   [id number] 10/29/19 Arrival Time: 9702   CC: COVID symptoms  SUBJECTIVE: History from: patient.  Travis Lutz is a 64 y.o. male who presents with body aches, chills, fever, tachycardia, fatigue, SOB and weakness .  Denies sick exposure to COVID, flu or strep.  Denies recent travel.  Has tried no attempts to treat at home.  Symptoms are made worse with activity.  Reports previous symptoms in the past. Denies sinus pain, ST, rash or other symptoms.   ROS: As per HPI.  All other pertinent ROS negative.     History reviewed. No pertinent past medical history. Past Surgical History:  Procedure Laterality Date  . REPAIR OF PERFORATED ULCER     No Known Allergies No current facility-administered medications on file prior to encounter.   Current Outpatient Medications on File Prior to Encounter  Medication Sig Dispense Refill  . acetaminophen (TYLENOL) 500 MG tablet Take 1 tablet (500 mg total) by mouth every 6 (six) hours as needed. 30 tablet 0  . albuterol (PROVENTIL HFA;VENTOLIN HFA) 108 (90 Base) MCG/ACT inhaler Inhale 1-2 puffs into the lungs every 6 (six) hours as needed for wheezing or shortness of breath. 54 g 3  . benzonatate (TESSALON) 100 MG capsule Take 1-2 capsules (100-200 mg total) by mouth 3 (three) times daily as needed. 60 capsule 0  . cetirizine (ZYRTEC ALLERGY) 10 MG tablet Take 1 tablet (10 mg total) by mouth daily. 90 tablet 0  . cyclobenzaprine (FLEXERIL) 10 MG tablet Take 1 tablet (10 mg total) by mouth 2 (two) times daily as needed for muscle spasms. 20 tablet 0  . Ketorolac-Bupiv-Ketamine 60-150-60 MG/50ML SOSY SMARTSIG:2 Milliliter(s) IM Once PRN    . methylPREDNISolone acetate (DEPO-MEDROL) 40 MG/ML injection SMARTSIG:2 Milliliter(s) IM Once PRN    . promethazine-dextromethorphan (PROMETHAZINE-DM) 6.25-15 MG/5ML syrup Take 5 mLs by mouth at bedtime as needed for cough. 100 mL 0  . pseudoephedrine (SUDAFED) 30 MG tablet Take 1 tablet  (30 mg total) by mouth every 8 (eight) hours as needed for congestion. 30 tablet 0  . tolterodine (DETROL LA) 4 MG 24 hr capsule      Social History   Socioeconomic History  . Marital status: Significant Other    Spouse name: Not on file  . Number of children: Not on file  . Years of education: Not on file  . Highest education level: Not on file  Occupational History  . Not on file  Tobacco Use  . Smoking status: Current Every Day Smoker    Packs/day: 0.25    Types: Cigarettes  . Smokeless tobacco: Never Used  Substance and Sexual Activity  . Alcohol use: Yes    Alcohol/week: 0.0 standard drinks    Comment: beer daily   . Drug use: Yes    Types: Marijuana  . Sexual activity: Yes  Other Topics Concern  . Not on file  Social History Narrative  . Not on file   Social Determinants of Health   Financial Resource Strain:   . Difficulty of Paying Living Expenses: Not on file  Food Insecurity:   . Worried About Charity fundraiser in the Last Year: Not on file  . Ran Out of Food in the Last Year: Not on file  Transportation Needs:   . Lack of Transportation (Medical): Not on file  . Lack of Transportation (Non-Medical): Not on file  Physical Activity:   . Days of Exercise per Week: Not on file  . Minutes  of Exercise per Session: Not on file  Stress:   . Feeling of Stress : Not on file  Social Connections:   . Frequency of Communication with Friends and Family: Not on file  . Frequency of Social Gatherings with Friends and Family: Not on file  . Attends Religious Services: Not on file  . Active Member of Clubs or Organizations: Not on file  . Attends Archivist Meetings: Not on file  . Marital Status: Not on file  Intimate Partner Violence:   . Fear of Current or Ex-Partner: Not on file  . Emotionally Abused: Not on file  . Physically Abused: Not on file  . Sexually Abused: Not on file   Family History  Family history unknown: Yes     OBJECTIVE:  Vitals:   10/29/19 1536  BP: 130/77  Pulse: (!) 103  Resp: (!) 22  Temp: (!) 100.5 F (38.1 C)  TempSrc: Oral  SpO2: 100%     General appearance: alert; appears ill, but nontoxic; speaking in full sentences and tolerating own secretions HEENT: NCAT; Ears: EACs clear, TMs pearly gray; Eyes: PERRL.  EOM grossly intact. Sinuses: nontender; Nose: nares patent without rhinorrhea, Throat: oropharynx clear, tonsils non erythematous or enlarged, uvula midline  Neck: supple without LAD Lungs: unlabored respirations, symmetrical air entry; cough: moderate; no respiratory distress; rhonchi heard on ausculation, some clearing with cough Heart:tachycardia and regular rhythm.  Radial pulses 2+ symmetrical bilaterally Skin: warm and dry Psychological: alert and cooperative; normal mood and affect  LABS:  Results for orders placed or performed during the hospital encounter of 10/29/19 (from the past 24 hour(s))  POC SARS Coronavirus 2 Ag-ED - Nasal Swab (BD Veritor Kit)     Status: None   Collection Time: 10/29/19  4:15 PM  Result Value Ref Range   SARS Coronavirus 2 Ag NEGATIVE NEGATIVE  POC SARS Coronavirus 2 Ag     Status: None   Collection Time: 10/29/19  4:15 PM  Result Value Ref Range   SARS Coronavirus 2 Ag NEGATIVE NEGATIVE  Influenza A and B antigen     Status: None   Collection Time: 10/29/19  4:25 PM  Result Value Ref Range   Influenza A Ag NEGATIVE NEGATIVE   Influenza B Ag NEGATIVE NEGATIVE     ASSESSMENT & PLAN:  1. Influenza-like illness   2. Chills   3. Nausea without vomiting   4. Tachycardia   5. Myalgia     Meds ordered this encounter  Medications  . ondansetron (ZOFRAN-ODT) disintegrating tablet 4 mg  . acetaminophen (TYLENOL) tablet 650 mg   Flu test negative in office. Pt very weak and needs wheelchair to get to xray. He vehemently refuses to go to the ER, at which I have strongly encouraged him. Discussed at length with patient the risk of  decline and sepsis, dehydration, electrolyte depletion.  Chest x-ray in office was negative today.  Discussed with patient the possibility of having a PE.  Discussed the importance of having further evaluation as soon as possible. He has decided that he will go to the ER by personal vehicle.   Rapid COVID negative.  Culture sent.  Patient should remain in quarantine until they have received culture results.  If negative you may resume normal activities (go back to work/school) while practicing hand hygiene, social distance, and mask wearing.  If positive, patient should remain in quarantine for 10 days from symptom onset AND greater than 72 hours after symptoms resolution (absence of  fever without the use of fever-reducing medication and improvement in respiratory symptoms), whichever is longer Get plenty of rest and push fluids Use OTC zyrtec for nasal congestion, runny nose, and/or sore throat Use OTC flonase for nasal congestion and runny nose Use medications daily for symptom relief Use OTC medications like ibuprofen or tylenol as needed fever or pain Call or go to the ED if you have any new or worsening symptoms such as fever, worsening cough, shortness of breath, chest tightness, chest pain, turning blue, changes in mental status, etc...    COVID testing ordered.  It will take between 5-7 days for test results.  Someone will contact you regarding abnormal results.    Call or go to the ED if you have any new or worsening symptoms such as fever, worsening cough, shortness of breath, chest tightness, chest pain, turning blue, changes in mental status, etc...   Reviewed expectations re: course of current medical issues. Questions answered. Outlined signs and symptoms indicating need for more acute intervention. Patient verbalized understanding. After Visit Summary given.         Faustino Congress, NP 10/29/19 1719

## 2019-10-29 NOTE — Discharge Instructions (Addendum)
°  Go to the emergency department for further evaluation and treatment, rule out PE.

## 2019-10-29 NOTE — ED Triage Notes (Signed)
The [facility] was [facility]  The pt has had chest pain for one week with body aches chills and temp

## 2019-10-29 NOTE — ED Triage Notes (Signed)
The pt has been to [facility] x 2 today

## 2019-10-30 MED ORDER — CEPHALEXIN 500 MG PO CAPS: 500.0000 mg | ORAL_CAPSULE | Freq: Four times a day (QID) | ORAL | 0 refills | Status: DC

## 2019-10-30 MED ORDER — ONDANSETRON 4 MG PO TBDP: 4.0000 mg | ORAL_TABLET | Freq: Three times a day (TID) | ORAL | 0 refills | Status: DC | PRN

## 2019-10-30 MED ORDER — ONDANSETRON 4 MG PO TBDP
8.0000 mg | ORAL_TABLET | Freq: Once | ORAL | Status: AC
  Administered 2019-10-30: 8 mg via ORAL
  Filled 2019-10-30: qty 2

## 2019-10-30 MED ORDER — CEPHALEXIN 250 MG PO CAPS
500.0000 mg | ORAL_CAPSULE | Freq: Once | ORAL | Status: AC
  Administered 2019-10-30: 500 mg via ORAL
  Filled 2019-10-30: qty 2

## 2019-10-30 NOTE — ED Provider Notes (Signed)
[facility] EMERGENCY DEPARTMENT Provider Note   CSN: [id number] Arrival date & time: 10/29/19  1731     History Chief Complaint  Patient presents with  . Generalized Body Aches    Travis Lutz is a 64 y.o. male.  Patient to ED with symptoms of headache, body aches, chills, cough for the past 1-2 days. Seen at Urgent Care yesterday and reports negative rapid COVID, negative chest x-ray. The patient also reports nausea, pain with urination and difficulty moving his bowels. Last bowel movement was 2 days ago. No abdominal pain. He denies hematuria but reports he sometimes still feels like he has to urinate when he is finished. He reports testicular pain bilaterally and "sometimes they swell and make it hard for me to walk normally". He states that testicular pain and scrotal swelling have been going on intermittently for the past year, and he can have 2-3 episodes every month. No swelling currently but he does report pain.   The history is provided by the patient. No language interpreter was used.       History reviewed. No pertinent past medical history.  Patient Active Problem List   Diagnosis Date Noted  . Dyspnea 02/29/2016  . Chronic low back pain 12/06/2015  . Bilateral chronic knee pain 12/06/2015  . Foot pain, bilateral 12/06/2015  . Vitamin D deficiency 11/16/2015  . Migraine headache 11/15/2015  . Testicular pain 11/15/2015  . 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 Homeless single person 11/15/2015  . Smoker 11/15/2015  . Alcohol use disorder 11/15/2015    Past Surgical History:  Procedure Laterality Date  . REPAIR OF PERFORATED ULCER         Family History  Family history unknown: Yes    Social History   Tobacco Use  . Smoking status: Current Every Day Smoker    Packs/day: 0.25    Types: Cigarettes  . Smokeless tobacco: Never Used  Substance Use Topics  . Alcohol use: Yes    Alcohol/week: 0.0 standard drinks    Comment: beer daily   . Drug use: Yes    Types: Marijuana    Home Medications Prior to Admission medications   Medication Sig Start Date End Date Taking? Authorizing Provider  acetaminophen (TYLENOL) 500 MG tablet Take 1 tablet (500 mg total) by mouth every 6 (six) hours as needed. 10/15/19   Orvan July, NP  albuterol (PROVENTIL HFA;VENTOLIN HFA) 108 (90 Base) MCG/ACT inhaler Inhale 1-2 puffs into the lungs every 6 (six) hours as needed for wheezing or shortness of breath. 03/02/16   Funches, Adriana Mccallum, MD  benzonatate (TESSALON) 100 MG capsule Take 1-2 capsules (100-200 mg total) by mouth 3 (three) times daily as needed. 10/24/19   Jaynee Eagles, PA-C  cetirizine (ZYRTEC ALLERGY) 10 MG tablet Take 1 tablet (10 mg total) by mouth daily. 10/24/19   Jaynee Eagles, PA-C  cyclobenzaprine (FLEXERIL) 10 MG tablet Take 1 tablet (10 mg total) by mouth 2 (two) times daily as needed for muscle spasms. 10/15/19   Orvan July, NP  Ketorolac-Bupiv-Ketamine 60-150-60 MG/50ML SOSY SMARTSIG:2 Milliliter(s) IM Once PRN 07/24/19   [provider]  methylPREDNISolone acetate (DEPO-MEDROL) 40 MG/ML injection SMARTSIG:2 Milliliter(s) IM Once PRN 07/24/19   [provider]  promethazine-dextromethorphan (PROMETHAZINE-DM) 6.25-15 MG/5ML syrup Take 5 mLs by mouth at bedtime as needed for cough. 10/24/19   Jaynee Eagles, PA-C  pseudoephedrine (SUDAFED) 30 MG tablet Take 1 tablet (30 mg total) by mouth every 8 (  eight) hours as needed for congestion. 10/24/19   Jaynee Eagles, PA-C  tolterodine (DETROL LA) 4 MG 24 hr capsule  06/12/19   [provider]    Allergies    Patient has no known allergies.  Review of Systems   Review of Systems  Constitutional: Positive for appetite change and chills.  HENT: Negative.   Respiratory: Positive for cough and chest tightness.   Cardiovascular: Negative.   Gastrointestinal: Positive for constipation and nausea. Negative for abdominal pain and rectal pain.    Genitourinary: Positive for dysuria, frequency and testicular pain. Negative for flank pain, hematuria and scrotal swelling.  Musculoskeletal: Positive for myalgias.  Skin: Negative.   Neurological: Negative.     Physical Exam Updated Vital Signs BP 124/83   Pulse 92   Temp 98.5 F (36.9 C) (Oral)   Resp 19   Ht 6\' 1"  (1.854 m)   Wt 55.1 kg   SpO2 100%   BMI 16.03 kg/m   Physical Exam Vitals and nursing note reviewed.  Constitutional:      Appearance: He is well-developed.  HENT:     Head: Normocephalic.  Cardiovascular:     Rate and Rhythm: Normal rate and regular rhythm.  Pulmonary:     Effort: Pulmonary effort is normal.     Breath sounds: Normal breath sounds. No wheezing, rhonchi or rales.  Abdominal:     General: Bowel sounds are normal.     Palpations: Abdomen is soft.     Tenderness: There is no abdominal tenderness. There is no guarding or rebound.  Genitourinary:    Comments: No scrotal swelling. No testicular mass. Tender testicles bilaterally.  Musculoskeletal:        General: Normal range of motion.     Cervical back: Normal range of motion and neck supple.  Skin:    General: Skin is warm and dry.     Findings: No rash.  Neurological:     Mental Status: He is alert and oriented to person, place, and time.     ED Results / Procedures / Treatments   Labs (all labs ordered are listed, but only abnormal results are displayed) Labs Reviewed  COMPREHENSIVE METABOLIC PANEL - Abnormal; Notable for the following components:      Result Value   Sodium 134 (*)    Glucose, Bld 106 (*)    All other components within normal limits  CBC - Abnormal; Notable for the following components:   WBC 11.3 (*)    MCV 104.6 (*)    MCH 35.1 (*)    All other components within normal limits  URINALYSIS, ROUTINE W REFLEX MICROSCOPIC - Abnormal; Notable for the following components:   APPearance HAZY (*)    Hgb urine dipstick SMALL (*)    Protein, ur 30 (*)     Leukocytes,Ua LARGE (*)    WBC, UA >50 (*)    Bacteria, UA FEW (*)    All other components within normal limits  URINE CULTURE  LIPASE, BLOOD    EKG None  Radiology DG Chest 2 View  Result Date: 10/29/2019 CLINICAL DATA:  Cough and fever EXAM: CHEST - 2 VIEW COMPARISON:  06/16/2016 FINDINGS: The heart size and mediastinal contours are within normal limits. Both lungs are clear. The visualized skeletal structures are unremarkable. IMPRESSION: No active cardiopulmonary disease. Electronically Signed   By: Donavan Foil M.D.   On: 10/29/2019 16:59    Procedures Procedures (including critical care time)  Medications Ordered in ED Medications  sodium chloride flush (NS) 0.9 % injection 3 mL (3 mLs Intravenous Not Given 10/30/19 0019)  ondansetron (ZOFRAN-ODT) disintegrating tablet 8 mg (has no administration in time range)  cephALEXin (KEFLEX) capsule 500 mg (has no administration in time range)    ED Course  I have reviewed the triage vital signs and the nursing notes.  Pertinent labs & imaging results that were available during my care of the patient were reviewed by me and considered in my medical decision making (see chart for details).    MDM Rules/Calculators/A&P                      Patient to ED with symptoms as described in the HPI. Seen at [facility] yesterday for same, sent here for further evaluation.   Labs and chest x-ray from Urgent Care reviewed. Negative COVID (x2), neg CXR, negative influenza. His urine is positive for >50 WBC's suggesting infection although few bacteria seen. Given remainder of tests are normal, will treat with Keflex for UTI.   Discussed hte importance of having a primary care physician for routine medical concerns and to further evaluate his intermittent testicular pain x 1 year.  Final Clinical Impression(s) / ED Diagnoses Final diagnoses:  None   1. Urinary Tract infection  Rx / DC Orders ED Discharge Orders    None         Dennie Bible 10/30/19 0224    Mesner, Corene Cornea, MD 10/30/19 [phone]

## 2019-10-30 NOTE — ED Notes (Signed)
Pt verbalized understanding of d/c instructions, follow up care, s/s requiring return to ED and scripts. Pt has no additional questions at this time

## 2019-10-30 NOTE — Discharge Instructions (Addendum)
Take the antibiotic for the infection in your urine. Your COVID test (both of them) are negative. Take Zofran if nausea continues.   It is important to have a primary care doctor to help with your medical care including the intermittent testicular pain you have had for the past one year. Please call the [facility] to make an appointment.

## 2019-10-31 LAB — URINE CULTURE: Culture: NO GROWTH

## 2019-11-07 ENCOUNTER — Encounter (HOSPITAL_COMMUNITY): Payer: Self-pay

## 2019-11-07 ENCOUNTER — Other Ambulatory Visit: Payer: Self-pay

## 2019-11-07 ENCOUNTER — Emergency Department (HOSPITAL_COMMUNITY): Payer: BC Managed Care – PPO

## 2019-11-07 ENCOUNTER — Emergency Department (HOSPITAL_COMMUNITY)
Admission: EM | Admit: 2019-11-07 | Discharge: 2019-11-07 | Disposition: A | Discharge status: Home / Self Care | Payer: BC Managed Care – PPO | Attending: Emergency Medicine | Admitting: Emergency Medicine

## 2019-11-07 DIAGNOSIS — R109 Unspecified abdominal pain: Secondary | ICD-10-CM | POA: Diagnosis present

## 2019-11-07 DIAGNOSIS — Z79899 Other long term (current) drug therapy: Secondary | ICD-10-CM | POA: Diagnosis not present

## 2019-11-07 DIAGNOSIS — F17210 Nicotine dependence, cigarettes, uncomplicated: Secondary | ICD-10-CM | POA: Insufficient documentation

## 2019-11-07 DIAGNOSIS — R1030 Lower abdominal pain, unspecified: Secondary | ICD-10-CM

## 2019-11-07 DIAGNOSIS — R82998 Other abnormal findings in urine: Secondary | ICD-10-CM | POA: Insufficient documentation

## 2019-11-07 LAB — URINALYSIS, ROUTINE W REFLEX MICROSCOPIC
Bilirubin Urine: NEGATIVE
Glucose, UA: NEGATIVE mg/dL
Hgb urine dipstick: NEGATIVE
Ketones, ur: NEGATIVE mg/dL
Nitrite: NEGATIVE
Protein, ur: NEGATIVE mg/dL
Specific Gravity, Urine: 1.024 (ref 1.005–1.030)
WBC, UA: >50 WBC/hpf — ABNORMAL HIGH (ref 0–5)
pH: 5 (ref 5.0–8.0)

## 2019-11-07 LAB — COMPREHENSIVE METABOLIC PANEL
ALT: 25 U/L (ref 0–44)
AST: 21 U/L (ref 15–41)
Albumin: 3.2 g/dL — ABNORMAL LOW (ref 3.5–5.0)
Alkaline Phosphatase: 83 U/L (ref 38–126)
Anion gap: 10 (ref 5–15)
BUN: 10 mg/dL (ref 8–23)
CO2: 26 mmol/L (ref 22–32)
Calcium: 9 mg/dL (ref 8.9–10.3)
Chloride: 108 mmol/L (ref 98–111)
Creatinine, Ser: 0.82 mg/dL (ref 0.61–1.24)
GFR calc Af Amer: >60 mL/min (ref >60)
GFR calc non Af Amer: >60 mL/min (ref >60)
Glucose, Bld: 105 mg/dL — ABNORMAL HIGH (ref 70–99)
Potassium: 4.5 mmol/L (ref 3.5–5.1)
Sodium: 144 mmol/L (ref 135–145)
Total Bilirubin: 0.2 mg/dL — ABNORMAL LOW (ref 0.3–1.2)
Total Protein: 7.1 g/dL (ref 6.5–8.1)

## 2019-11-07 LAB — CBC WITH DIFFERENTIAL/PLATELET
Abs Immature Granulocytes: 0.04 10*3/uL (ref 0.00–0.07)
Basophils Absolute: 0.1 10*3/uL (ref 0.0–0.1)
Basophils Relative: 1 %
Eosinophils Absolute: 0.1 10*3/uL (ref 0.0–0.5)
Eosinophils Relative: 2 %
HCT: 41.4 % (ref 39.0–52.0)
Hemoglobin: 13.5 g/dL (ref 13.0–17.0)
Immature Granulocytes: 1 %
Lymphocytes Relative: 17 %
Lymphs Abs: 1.2 10*3/uL (ref 0.7–4.0)
MCH: 35 pg — ABNORMAL HIGH (ref 26.0–34.0)
MCHC: 32.6 g/dL (ref 30.0–36.0)
MCV: 107.3 fL — ABNORMAL HIGH (ref 80.0–100.0)
Monocytes Absolute: 1 10*3/uL (ref 0.1–1.0)
Monocytes Relative: 13 %
Neutro Abs: 4.7 10*3/uL (ref 1.7–7.7)
Neutrophils Relative %: 66 %
Platelets: 503 10*3/uL — ABNORMAL HIGH (ref 150–400)
RBC: 3.86 MIL/uL — ABNORMAL LOW (ref 4.22–5.81)
RDW: 14.4 % (ref 11.5–15.5)
WBC: 7.1 10*3/uL (ref 4.0–10.5)
nRBC: 0 % (ref 0.0–0.2)

## 2019-11-07 LAB — HIV ANTIBODY (ROUTINE TESTING W REFLEX): HIV Screen 4th Generation wRfx: NONREACTIVE

## 2019-11-07 LAB — LIPASE, BLOOD: Lipase: 26 U/L (ref 11–51)

## 2019-11-07 MED ORDER — DICYCLOMINE HCL 20 MG PO TABS: 20.0000 mg | ORAL_TABLET | Freq: Two times a day (BID) | ORAL | 0 refills | Status: DC

## 2019-11-07 MED ORDER — KETOROLAC TROMETHAMINE 30 MG/ML IJ SOLN
30.0000 mg | Freq: Once | INTRAMUSCULAR | Status: AC
  Administered 2019-11-07: 09:00:00 30 mg via INTRAVENOUS
  Filled 2019-11-07: qty 1

## 2019-11-07 MED ORDER — CEFTRIAXONE SODIUM 500 MG IJ SOLR
500.0000 mg | Freq: Once | INTRAMUSCULAR | Status: AC
  Administered 2019-11-07: 500 mg via INTRAMUSCULAR
  Filled 2019-11-07: qty 500

## 2019-11-07 MED ORDER — IOHEXOL 300 MG/ML  SOLN
100.0000 mL | Freq: Once | INTRAMUSCULAR | Status: AC | PRN
  Administered 2019-11-07: 100 mL via INTRAVENOUS

## 2019-11-07 MED ORDER — STERILE WATER FOR INJECTION IJ SOLN
INTRAMUSCULAR | Status: AC
  Administered 2019-11-07: 10 mL
  Filled 2019-11-07: qty 10

## 2019-11-07 MED ORDER — DOXYCYCLINE HYCLATE 100 MG PO CAPS: 100.0000 mg | ORAL_CAPSULE | Freq: Two times a day (BID) | ORAL | 0 refills | Status: AC

## 2019-11-07 NOTE — ED Triage Notes (Signed)
Pt to ED from home with c/o right groin pain. Pt states he has been experiencing pain for about a year and has been seen at [facility] for the same issue before. Pt ambulatory but reports difficulty walking straight due to pain. States he finished med given to him on last visit, cannot recall what it was.

## 2019-11-07 NOTE — ED Notes (Signed)
Pt transported to u/s.  

## 2019-11-07 NOTE — Discharge Instructions (Addendum)
Your labwork and imaging were reassuring today.   Please follow up with your urologist regarding your returning groin pain.   Please pick up antibiotics and take as prescribed. Your urine did show white blood cells in it concerning for some type of infection. We have tested for STIs as well as these can sometimes cause white blood cells in the urine. You will receive a call in 2-3 days IF you test positive. You can log into your MyChart account to check the results that way as well.   Refrain from intercourse for the next 10 days. If you test positive for any STIs you will need to let all partners know you have been tested and treated and they will need to do the same.   If you test positive for syphilis please return to the ED for treatment. The medication we are prescribing today covers for gonorrhea and chlamydia only.

## 2019-11-07 NOTE — ED Notes (Signed)
Pt transported to CT ?

## 2019-11-07 NOTE — ED Provider Notes (Signed)
[place] EMERGENCY DEPARTMENT Provider Note   CSN: [id number] Arrival date & time: 11/07/19  0820     History Chief Complaint  Patient presents with  . Groin Pain    Travis Lutz is a 64 y.o. male who presents to the ED today with complaint of gradual onset, constant, worsening, suprapubic pain/bilateral inguinal pain radiating into bilateral testicles x 1 week. Pt also complains of nausea however denies emesis. No fevers or chills.   He was recently seen in the ED on 02/18 with complaints of HA, myalgias, chills, cough x 1-2 days as well as nausea, dysuria, and constipation. Seen at urgent care a day prior with negative rapid COVID test and negative CXR. U/A in the ED with > 50 WBCs, discharged home with keflex for UTI. Pt reports he finished his antibiotics and then began having testicular pain. He reports similar symptoms in the past; has had multiple ultrasounds in the past with findings of varicoceles and hydroceles however no signs of torsion or epididymitis in the past. Pt is sexually active with 1 male partner for the last 20 years. He denies penile discharge and is not concerned about STIs.   MRI Pelvis 07/19/2019 with findings: Mild tendinosis of the left hamstring origin.  Chronic AVN of the right hip.  Degenerative changes at L5-S1.  The history is provided by the patient and medical records.       History reviewed. No pertinent past medical history.  Patient Active Problem List   Diagnosis Date Noted  . Dyspnea 02/29/2016  . Chronic low back pain 12/06/2015  . Bilateral chronic knee pain 12/06/2015  . Foot pain, bilateral 12/06/2015  . Vitamin D deficiency 11/16/2015  . Migraine headache 11/15/2015  . Testicular pain 11/15/2015  . 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 Homeless single person 11/15/2015  . Smoker 11/15/2015  . Alcohol use disorder 11/15/2015    Past Surgical History:  Procedure Laterality Date    . REPAIR OF PERFORATED ULCER         Family History  Family history unknown: Yes    Social History   Tobacco Use  . Smoking status: Current Every Day Smoker    Packs/day: 0.25    Types: Cigarettes  . Smokeless tobacco: Never Used  Substance Use Topics  . Alcohol use: Yes    Alcohol/week: 0.0 standard drinks    Comment: beer daily   . Drug use: Yes    Types: Marijuana, Cocaine    Comment: marijuana yesterday, cocaine last week    Home Medications Prior to Admission medications   Medication Sig Start Date End Date Taking? Authorizing Provider  acetaminophen (TYLENOL) 500 MG tablet Take 1 tablet (500 mg total) by mouth every 6 (six) hours as needed. Patient taking differently: Take 500 mg by mouth every 6 (six) hours as needed for mild pain or fever.  10/15/19   Orvan July, NP  albuterol (PROVENTIL HFA;VENTOLIN HFA) 108 (90 Base) MCG/ACT inhaler Inhale 1-2 puffs into the lungs every 6 (six) hours as needed for wheezing or shortness of breath. 03/02/16   Funches, Adriana Mccallum, MD  benzonatate (TESSALON) 100 MG capsule Take 1-2 capsules (100-200 mg total) by mouth 3 (three) times daily as needed. 10/24/19   Jaynee Eagles, PA-C  cephALEXin (KEFLEX) 500 MG capsule Take 1 capsule (500 mg total) by mouth 4 (four) times daily. 10/30/19   Charlann Lange, PA-C  cetirizine (ZYRTEC ALLERGY) 10 MG tablet Take 1 tablet (10  mg total) by mouth daily. 10/24/19   Jaynee Eagles, PA-C  cyclobenzaprine (FLEXERIL) 10 MG tablet Take 1 tablet (10 mg total) by mouth 2 (two) times daily as needed for muscle spasms. 10/15/19   Loura Halt A, NP  doxycycline (VIBRAMYCIN) 100 MG capsule Take 1 capsule (100 mg total) by mouth 2 (two) times daily for 7 days. 11/07/19 11/14/19  Alroy Bailiff, Quy Lotts, PA-C  ondansetron (ZOFRAN ODT) 4 MG disintegrating tablet Take 1 tablet (4 mg total) by mouth every 8 (eight) hours as needed for nausea or vomiting. 10/30/19   Charlann Lange, PA-C  promethazine-dextromethorphan (PROMETHAZINE-DM) 6.25-15  MG/5ML syrup Take 5 mLs by mouth at bedtime as needed for cough. 10/24/19   Jaynee Eagles, PA-C  pseudoephedrine (SUDAFED) 30 MG tablet Take 1 tablet (30 mg total) by mouth every 8 (eight) hours as needed for congestion. 10/24/19   Jaynee Eagles, PA-C    Allergies    Patient has no known allergies.  Review of Systems   Review of Systems  Constitutional: Negative for chills and fever.  Respiratory: Negative for shortness of breath.   Cardiovascular: Negative for chest pain.  Gastrointestinal: Positive for abdominal pain and nausea. Negative for constipation, diarrhea and vomiting.  Genitourinary: Positive for testicular pain. Negative for discharge.  All other systems reviewed and are negative.   Physical Exam Updated Vital Signs BP 136/85   Pulse 78   Temp 98 F (36.7 C) (Oral)   Resp 20   Ht 6\' 1"  (1.854 m)   Wt 59 kg   SpO2 100%   BMI 17.15 kg/m   Physical Exam Vitals and nursing note reviewed.  Constitutional:      Appearance: He is not ill-appearing or diaphoretic.  HENT:     Head: Normocephalic and atraumatic.  Eyes:     Conjunctiva/sclera: Conjunctivae normal.  Cardiovascular:     Rate and Rhythm: Normal rate and regular rhythm.  Pulmonary:     Effort: Pulmonary effort is normal.     Breath sounds: Normal breath sounds.  Abdominal:     Palpations: Abdomen is soft.     Tenderness: There is no abdominal tenderness.  Genitourinary:    Comments: Chaperone present for exam Ermalinda Barrios, April N, RN Circumcised penis without phimosis/paraphimosis, hypospadias, erythema, tenderness, or discharge. No rashes or lesions. Testes with tenderness bilaterally, no swelling, and cremasterics reflex present bilaterally. No abnormal lie. No inguinal hernias or adenopathy present. Musculoskeletal:     Cervical back: Neck supple.  Skin:    General: Skin is warm and dry.  Neurological:     Mental Status: He is alert.     ED Results / Procedures / Treatments   Labs (all labs ordered  are listed, but only abnormal results are displayed) Labs Reviewed  COMPREHENSIVE METABOLIC PANEL - Abnormal; Notable for the following components:      Result Value   Glucose, Bld 105 (*)    Albumin 3.2 (*)    Total Bilirubin 0.2 (*)    All other components within normal limits  CBC WITH DIFFERENTIAL/PLATELET - Abnormal; Notable for the following components:   RBC 3.86 (*)    MCV 107.3 (*)    MCH 35.0 (*)    Platelets 503 (*)    All other components within normal limits  URINALYSIS, ROUTINE W REFLEX MICROSCOPIC - Abnormal; Notable for the following components:   APPearance HAZY (*)    Leukocytes,Ua MODERATE (*)    WBC, UA >50 (*)    Bacteria, UA RARE (*)  All other components within normal limits  URINE CULTURE  LIPASE, BLOOD  RPR  HIV ANTIBODY (ROUTINE TESTING W REFLEX)  GC/CHLAMYDIA PROBE AMP ([place]) NOT AT [facility]    EKG None  Radiology CT Abdomen Pelvis W Contrast  Result Date: 11/07/2019 CLINICAL DATA:  Right groin pain. EXAM: CT ABDOMEN AND PELVIS WITH CONTRAST TECHNIQUE: Multidetector CT imaging of the abdomen and pelvis was performed using the standard protocol following bolus administration of intravenous contrast. CONTRAST:  139mL OMNIPAQUE IOHEXOL 300 MG/ML  SOLN COMPARISON:  Scrotal sonogram of the same date and CT abdomen and pelvis of 06/20/2019 FINDINGS: Lower chest: Basilar atelectasis, no signs of effusion or consolidation. Hepatobiliary: Numerous small areas of low attenuation in the liver compatible with cysts with respect to the larger areas, numerous subcentimeter foci without change, also likely benign. Portal vein is patent. Hepatic veins are patent. No signs of biliary ductal dilation. No signs of pericholecystic inflammation. Pancreas: Pancreas with mild ductal dilation is not changed compared to the previous evaluation. This finding is not changed dating back to at least 2016. Spleen: Spleen is normal. Adrenals/Urinary Tract: Thickening of the left  adrenal gland is stable. Right adrenal gland is normal. No signs of hydronephrosis. Symmetric renal enhancement. No suspicious renal lesion. Distal ureters are difficult to assess due to lack of retroperitoneal fat. Urinary bladder is moderately distended otherwise unremarkable. Stomach/Bowel: No signs of acute gastrointestinal process. Lack of mesenteric fat limits assessment of gastrointestinal structures. Appendix is not visualized though there are no secondary signs to suggest appendicitis. Vascular/Lymphatic: Calcified atherosclerotic changes of the abdominal aorta. Patent visceral vasculature. No signs of aneurysm in the abdomen. No signs of adenopathy in the retroperitoneum or upper abdomen. No evidence of adenopathy in the pelvis. Reproductive: Mildly heterogeneous prostate, nonspecific finding. Similar to prior exams. Other: No abdominal wall hernia or abnormality. No abdominopelvic ascites. Musculoskeletal: No sign of acute bone finding or evidence of destructive bone process. Signs of right femoral AVN of the femoral head is similar to prior studies. IMPRESSION: No acute findings in the abdomen or pelvis. Limited assessment of bowel and distal ureters due to the paucity of intra-abdominal fat and retroperitoneal fat. Aortic Atherosclerosis (ICD10-I70.0). Electronically Signed   By: Zetta Bills M.D.   On: 11/07/2019 11:12   US SCROTUM W/DOPPLER  Result Date: 11/07/2019 CLINICAL DATA:  Bilateral testicular pain. EXAM: SCROTAL ULTRASOUND DOPPLER ULTRASOUND OF THE TESTICLES TECHNIQUE: Complete ultrasound examination of the testicles, epididymis, and other scrotal structures was performed. Color and spectral Doppler ultrasound were also utilized to evaluate blood flow to the testicles. COMPARISON:  None. FINDINGS: Right testicle Measurements: 4.2 x 2.2 x 3.5 cm. 4 mm cyst in the testicle. Otherwise normal. Left testicle Measurements: 4.0 x 1.9 x 3.1 cm. 4 mm cyst in the testicle. Otherwise normal. Right  epididymis:  Normal in size and appearance. Left epididymis:  Normal in size and appearance. Hydrocele:  Small right hydrocele. Varicocele:  Prominent bilateral varicoceles. Pulsed Doppler interrogation of both testes demonstrates normal low resistance arterial and venous waveforms bilaterally. IMPRESSION: Prominent bilateral varicoceles.  No other significant abnormality. Electronically Signed   By: Lorriane Shire M.D.   On: 11/07/2019 09:46    Procedures Procedures (including critical care time)  Medications Ordered in ED Medications  ketorolac (TORADOL) 30 MG/ML injection 30 mg (30 mg Intravenous Given 11/07/19 0857)  iohexol (OMNIPAQUE) 300 MG/ML solution 100 mL (100 mLs Intravenous Contrast Given 11/07/19 1050)  cefTRIAXone (ROCEPHIN) injection 500 mg (500 mg Intramuscular Given 11/07/19  1158)  sterile water (preservative free) injection (10 mLs  Given 11/07/19 1158)    ED Course  I have reviewed the triage vital signs and the nursing notes.  Pertinent labs & imaging results that were available during my care of the patient were reviewed by me and considered in my medical decision making (see chart for details).  64 year old male presenting to the ED with bilateral testicle/groin pain. Hx of same with previous nonconcerning ultrasounds. Recently treated for UTI with keflex however culture without any growth.    On arrival to the ED pt is afebrile, nontachycardic, and nontachypneic. He does have significant tenderness in his suprapubic area as well as testicles. No penile discharge appreciated. Pt denies concern for STIs. Will treat pain and obtain ultrasound with screening labs at this time. If no acute findings may consider CT scan - pt does have hx of inguinal hernia however none appreciated today on exam.   CBC without leukocytosis. Hgb stable.  CMP with mild elevation glucose 105 however no other abnormalities.  Lipase 26.  U/A hazy in appearance with moderate leuks, > 50 WBCs, and  rare bacteria. Previous U/A 1 week ago with hgb however none seen today. Given pt's age and no growth on urine culture last week with similar looking urine have ordered GC chlamydia from urine.   Ultrasound with bilateral varicoceles; no other findings. No concern for epididymtis. Have ordered CT scan.   CT scan without acute findings. No concern for pyelo of infected kidney stone at this time.   Upon reevaluation patient states his pain has improved. It does appear pt is being followed by urology for persistent groin pain. Have advised that he follow up with them regarding this.   Had lengthy discussion with patient regarding the fact that he has WBCs in his urine with mild concern for STIs being the cause given UTI unlikely in male his age. He does report he has a "friend" who he has been intimate with on and off for approximately 20 years. He is denying any other partners however states that his partner lives in another city and will come visit him from time to time. Offered empiric treatment today to cover for potential STIs and he is in agreement. Will cover with rocephin and doxy outpatient. RPR and HIV testing obtained per patient request as well. Pt advised that we will call in 2-3 days IF he tests positive. If he is positive he will need to let his partner know so she can be treated as well. He is advised to follow up with urology as well as PCP; [place] and wellness info given. Strict return precautions discussed. Pt is in agreement with plan and stable for discharge home.   This note was prepared using Dragon voice recognition software and may include unintentional dictation errors due to the inherent limitations of voice recognition software.     MDM Rules/Calculators/A&P                       Final Clinical Impression(s) / ED Diagnoses Final diagnoses:  Inguinal pain, unspecified laterality  Lower abdominal pain  Urine WBC increased    Rx / DC Orders ED Discharge Orders          Ordered    doxycycline (VIBRAMYCIN) 100 MG capsule  2 times daily     11/07/19 1200           Discharge Instructions     Your labwork  and imaging were reassuring today.   Please follow up with your urologist regarding your returning groin pain.   Please pick up antibiotics and take as prescribed. Your urine did show white blood cells in it concerning for some type of infection. We have tested for STIs as well as these can sometimes cause white blood cells in the urine. You will receive a call in 2-3 days IF you test positive. You can log into your MyChart account to check the results that way as well.   Refrain from intercourse for the next 10 days. If you test positive for any STIs you will need to let all partners know you have been tested and treated and they will need to do the same.   If you test positive for syphilis please return to the ED for treatment. The medication we are prescribing today covers for gonorrhea and chlamydia only.        Eustaquio Maize, PA-C 11/07/19 1210    Isla Pence, MD 11/13/19 1200

## 2019-11-08 LAB — URINE CULTURE: Culture: NO GROWTH

## 2019-11-08 LAB — RPR: RPR Ser Ql: NONREACTIVE

## 2019-11-08 LAB — GC/CHLAMYDIA PROBE AMP (~~LOC~~) NOT AT ARMC
Chlamydia: NEGATIVE
Neisseria Gonorrhea: NEGATIVE

## 2019-11-10 ENCOUNTER — Encounter (HOSPITAL_COMMUNITY): Payer: Self-pay | Admitting: Emergency Medicine

## 2019-11-10 ENCOUNTER — Emergency Department (HOSPITAL_COMMUNITY)
Admission: EM | Admit: 2019-11-10 | Discharge: 2019-11-10 | Disposition: A | Discharge status: Home / Self Care | Payer: BC Managed Care – PPO | Attending: Emergency Medicine | Admitting: Emergency Medicine

## 2019-11-10 ENCOUNTER — Other Ambulatory Visit: Payer: Self-pay

## 2019-11-10 ENCOUNTER — Encounter: Payer: Self-pay | Admitting: Internal Medicine

## 2019-11-10 ENCOUNTER — Emergency Department (HOSPITAL_COMMUNITY): Payer: BC Managed Care – PPO

## 2019-11-10 DIAGNOSIS — R1030 Lower abdominal pain, unspecified: Secondary | ICD-10-CM | POA: Insufficient documentation

## 2019-11-10 DIAGNOSIS — N50811 Right testicular pain: Secondary | ICD-10-CM | POA: Diagnosis not present

## 2019-11-10 DIAGNOSIS — N50812 Left testicular pain: Secondary | ICD-10-CM

## 2019-11-10 DIAGNOSIS — N5089 Other specified disorders of the male genital organs: Secondary | ICD-10-CM | POA: Diagnosis not present

## 2019-11-10 DIAGNOSIS — F17210 Nicotine dependence, cigarettes, uncomplicated: Secondary | ICD-10-CM | POA: Insufficient documentation

## 2019-11-10 DIAGNOSIS — Z79899 Other long term (current) drug therapy: Secondary | ICD-10-CM | POA: Insufficient documentation

## 2019-11-10 LAB — COMPREHENSIVE METABOLIC PANEL
ALT: 24 U/L (ref 0–44)
AST: 24 U/L (ref 15–41)
Albumin: 3.3 g/dL — ABNORMAL LOW (ref 3.5–5.0)
Alkaline Phosphatase: 68 U/L (ref 38–126)
Anion gap: 8 (ref 5–15)
BUN: 6 mg/dL — ABNORMAL LOW (ref 8–23)
CO2: 26 mmol/L (ref 22–32)
Calcium: 8.9 mg/dL (ref 8.9–10.3)
Chloride: 104 mmol/L (ref 98–111)
Creatinine, Ser: 0.75 mg/dL (ref 0.61–1.24)
GFR calc Af Amer: >60 mL/min (ref >60)
GFR calc non Af Amer: >60 mL/min (ref >60)
Glucose, Bld: 114 mg/dL — ABNORMAL HIGH (ref 70–99)
Potassium: 4.6 mmol/L (ref 3.5–5.1)
Sodium: 138 mmol/L (ref 135–145)
Total Bilirubin: 0.4 mg/dL (ref 0.3–1.2)
Total Protein: 6.9 g/dL (ref 6.5–8.1)

## 2019-11-10 LAB — CBC
HCT: 39.9 % (ref 39.0–52.0)
Hemoglobin: 13 g/dL (ref 13.0–17.0)
MCH: 34.9 pg — ABNORMAL HIGH (ref 26.0–34.0)
MCHC: 32.6 g/dL (ref 30.0–36.0)
MCV: 107.3 fL — ABNORMAL HIGH (ref 80.0–100.0)
Platelets: 450 10*3/uL — ABNORMAL HIGH (ref 150–400)
RBC: 3.72 MIL/uL — ABNORMAL LOW (ref 4.22–5.81)
RDW: 14.4 % (ref 11.5–15.5)
WBC: 7.1 10*3/uL (ref 4.0–10.5)
nRBC: 0 % (ref 0.0–0.2)

## 2019-11-10 LAB — URINALYSIS, ROUTINE W REFLEX MICROSCOPIC
Bilirubin Urine: NEGATIVE
Glucose, UA: NEGATIVE mg/dL
Hgb urine dipstick: NEGATIVE
Ketones, ur: NEGATIVE mg/dL
Leukocytes,Ua: NEGATIVE
Nitrite: NEGATIVE
Protein, ur: NEGATIVE mg/dL
Specific Gravity, Urine: 1.016 (ref 1.005–1.030)
pH: 7 (ref 5.0–8.0)

## 2019-11-10 LAB — LIPASE, BLOOD: Lipase: 20 U/L (ref 11–51)

## 2019-11-10 MED ORDER — OXYCODONE-ACETAMINOPHEN 5-325 MG PO TABS
2.0000 | ORAL_TABLET | Freq: Once | ORAL | Status: AC
  Administered 2019-11-10: 2 via ORAL
  Filled 2019-11-10: qty 2

## 2019-11-10 MED ORDER — ONDANSETRON 8 MG PO TBDP: 8.0000 mg | ORAL_TABLET | Freq: Three times a day (TID) | ORAL | 0 refills | Status: DC | PRN

## 2019-11-10 MED ORDER — HYDROMORPHONE HCL 1 MG/ML IJ SOLN: 1.0000 mg | Freq: Once | INTRAMUSCULAR | Status: DC | PRN

## 2019-11-10 MED ORDER — OXYCODONE-ACETAMINOPHEN 5-325 MG PO TABS: 1.0000 | ORAL_TABLET | Freq: Four times a day (QID) | ORAL | 0 refills | Status: DC | PRN

## 2019-11-10 MED ORDER — SODIUM CHLORIDE 0.9% FLUSH: 3.0000 mL | Freq: Once | INTRAVENOUS | Status: DC

## 2019-11-10 MED ORDER — IBUPROFEN 400 MG PO TABS
600.0000 mg | ORAL_TABLET | Freq: Once | ORAL | Status: AC
  Administered 2019-11-10: 600 mg via ORAL
  Filled 2019-11-10: qty 1

## 2019-11-10 NOTE — ED Notes (Signed)
Pt discharge instructions and follow up reviewed with patient; pt verbalized understanding.

## 2019-11-10 NOTE — Discharge Instructions (Addendum)
  Antiinflammatory medications: Take 600 mg of ibuprofen every 6 hours or 440 mg (over the counter dose) to 500 mg (prescription dose) of naproxen every 12 hours for the next 3 days. After this time, these medications may be used as needed for pain. Take these medications with food to avoid upset stomach. Choose only one of these medications, do not take them together. Acetaminophen (generic for Tylenol): Should you continue to have additional pain while taking the ibuprofen or naproxen, you may add in acetaminophen as needed. Your daily total maximum amount of acetaminophen from all sources should be limited to 4000mg /day for persons without liver problems, or 2000mg /day for those with liver problems. Percocet: May take Percocet (oxycodone-acetaminophen) as needed for severe pain.   Do not drive or perform other dangerous activities while taking this medication as it can cause drowsiness as well as changes in reaction time and judgement.  Please note that each pill of Percocet contains 325 mg of acetaminophen (generic for Tylenol) and the above dosage limits apply. Nausea/vomiting: Use the ondansetron (generic for Zofran) for nausea or vomiting.  This medication may not prevent all vomiting or nausea, but can help facilitate better hydration. Things that can help with nausea/vomiting also include peppermint/menthol candies, vitamin B12, and ginger. Please take all of your antibiotics until finished!   You may develop abdominal discomfort or diarrhea from the antibiotic.  You may help offset this with probiotics which you can buy or get in yogurt. Do not eat or take the probiotics until 2 hours after your antibiotic.   Follow-up: Follow-up with the urologist.  Call to make an appointment.  Return: Return to the emergency department for any worsening symptoms, difficulty urinating, persistent fever, spreading abdominal pain, or any other major concerns.

## 2019-11-10 NOTE — ED Triage Notes (Signed)
Pt c/o groin/pelvic pain, he was recently seen for the same. Reports that he has to walk different and the pain is still present. Denies fever or difficulty urinating. No injury.

## 2019-11-10 NOTE — ED Provider Notes (Signed)
[place] EMERGENCY DEPARTMENT Provider Note   CSN: [id number] Arrival date & time: 11/10/19  0957     History Chief Complaint  Patient presents with  . Groin Pain    Travis Lutz is a 64 y.o. male.  HPI      Travis Lutz is a 64 y.o. male, with a history of chronic back pain, presenting to the ED with continued pelvic and testicular pain beginning around the beginning of last week.  He denies any known precipitating events. His pain is sharp and stabbing, severe, radiating from the genitals into the pelvis.  He has also noted some swelling and fullness to the scrotum. He voices compliance with his previously prescribed antibiotics.  Since he was last seen in the ED for this issue on Friday, February 26, he has not been able to contact a PCP or urologist due to the weekend.   Denies fever/chills, dysuria, hematuria, pain with bowel movements, difficulty urinating, incontinence, other abdominal pain, back pain, numbness/weakness in extremities, falls/trauma, or any other complaints.    History reviewed. No pertinent past medical history.  Patient Active Problem List   Diagnosis Date Noted  . Dyspnea 02/29/2016  . Chronic low back pain 12/06/2015  . Bilateral chronic knee pain 12/06/2015  . Foot pain, bilateral 12/06/2015  . Vitamin D deficiency 11/16/2015  . Migraine headache 11/15/2015  . Testicular pain 11/15/2015  . 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 Homeless single person 11/15/2015  . Smoker 11/15/2015  . Alcohol use disorder 11/15/2015    Past Surgical History:  Procedure Laterality Date  . REPAIR OF PERFORATED ULCER         Family History  Family history unknown: Yes    Social History   Tobacco Use  . Smoking status: Current Every Day Smoker    Packs/day: 0.25    Types: Cigarettes  . Smokeless tobacco: Never Used  Substance Use Topics  . Alcohol use: Yes    Alcohol/week: 0.0 standard drinks   Comment: beer daily   . Drug use: Yes    Types: Marijuana, Cocaine    Comment: marijuana yesterday, cocaine last week    Home Medications Prior to Admission medications   Medication Sig Start Date End Date Taking? Authorizing Provider  acetaminophen (TYLENOL) 500 MG tablet Take 1 tablet (500 mg total) by mouth every 6 (six) hours as needed. Patient taking differently: Take 500 mg by mouth every 6 (six) hours as needed for mild pain or fever.  10/15/19   Orvan July, NP  albuterol (PROVENTIL HFA;VENTOLIN HFA) 108 (90 Base) MCG/ACT inhaler Inhale 1-2 puffs into the lungs every 6 (six) hours as needed for wheezing or shortness of breath. 03/02/16   Boykin Nearing, MD  cetirizine (ZYRTEC ALLERGY) 10 MG tablet Take 1 tablet (10 mg total) by mouth daily. 10/24/19   Jaynee Eagles, PA-C  cyclobenzaprine (FLEXERIL) 10 MG tablet Take 1 tablet (10 mg total) by mouth 2 (two) times daily as needed for muscle spasms. 10/15/19   Loura Halt A, NP  dicyclomine (BENTYL) 20 MG tablet Take 1 tablet (20 mg total) by mouth 2 (two) times daily. 11/07/19   Eustaquio Maize, PA-C  doxycycline (VIBRAMYCIN) 100 MG capsule Take 1 capsule (100 mg total) by mouth 2 (two) times daily for 7 days. 11/07/19 11/14/19  Alroy Bailiff, Margaux, PA-C  ondansetron (ZOFRAN ODT) 8 MG disintegrating tablet Take 1 tablet (8 mg total) by mouth every 8 (eight) hours as needed  for nausea or vomiting. 11/10/19   Katilyn Miltenberger C, PA-C  oxyCODONE-acetaminophen (PERCOCET/ROXICET) 5-325 MG tablet Take 1-2 tablets by mouth every 6 (six) hours as needed for severe pain. 11/10/19   Naser Schuld C, PA-C  promethazine-dextromethorphan (PROMETHAZINE-DM) 6.25-15 MG/5ML syrup Take 5 mLs by mouth at bedtime as needed for cough. 10/24/19   Jaynee Eagles, PA-C  pseudoephedrine (SUDAFED) 30 MG tablet Take 1 tablet (30 mg total) by mouth every 8 (eight) hours as needed for congestion. 10/24/19   Jaynee Eagles, PA-C    Allergies    Patient has no known allergies.  Review of Systems     Review of Systems  Constitutional: Negative for chills, diaphoresis and fever.  Respiratory: Negative for shortness of breath.   Cardiovascular: Negative for chest pain and leg swelling.  Gastrointestinal: Positive for abdominal pain. Negative for blood in stool, constipation, diarrhea, nausea and vomiting.  Genitourinary: Positive for scrotal swelling and testicular pain. Negative for decreased urine volume, difficulty urinating, discharge, dysuria, flank pain, frequency, hematuria and penile pain.  Musculoskeletal: Negative for back pain.  Neurological: Negative for weakness.  All other systems reviewed and are negative.   Physical Exam Updated Vital Signs BP 140/82 (BP Location: Right Arm)   Pulse 84   Temp 97.9 F (36.6 C) (Oral)   Resp 16   Wt 59 kg   SpO2 100%   BMI 17.16 kg/m   Physical Exam Vitals and nursing note reviewed. Exam conducted with a chaperone present.  Constitutional:      General: He is in acute distress (pain).     Appearance: He is well-developed. He is not diaphoretic.  HENT:     Head: Normocephalic and atraumatic.     Mouth/Throat:     Mouth: Mucous membranes are moist.     Pharynx: Oropharynx is clear.  Eyes:     Conjunctiva/sclera: Conjunctivae normal.  Cardiovascular:     Rate and Rhythm: Normal rate and regular rhythm.     Pulses: Normal pulses.          Radial pulses are 2+ on the right side and 2+ on the left side.       Posterior tibial pulses are 2+ on the right side and 2+ on the left side.     Heart sounds: Normal heart sounds.     Comments: Tactile temperature in the extremities appropriate and equal bilaterally. Pulmonary:     Effort: Pulmonary effort is normal. No respiratory distress.     Breath sounds: Normal breath sounds.  Abdominal:     Palpations: Abdomen is soft.     Tenderness: There is abdominal tenderness. There is no guarding.    Genitourinary:    Testes: Cremasteric reflex is present.        Right: Tenderness and  swelling present.        Left: Tenderness and swelling present.     Comments: Suprapubic tenderness into the bilateral inguinal regions and the scrotum and testicles.  There is a fullness to the scrotum, but tenderness seems to be mostly to the testicles themselves.  Both testicles are able to be appreciated on exam.  No erythema or lesions noted to the genitals or surrounding skin. No noted tenderness to the perineum. No lesions, swelling, tenderness, or abnormal coloring to the penis.  No penile discharge noted. Musculoskeletal:     Cervical back: Neck supple.     Right lower leg: No edema.     Left lower leg: No edema.  Comments: No tenderness, swelling, erythema, or increased warmth in the bilateral lower extremities.  Full range of motion in the lower extremities without noted difficulty or pain, except for the pain that is caused to the genitals.  Skin:    General: Skin is warm and dry.  Neurological:     Mental Status: He is alert.     Comments: Sensation to light touch grossly intact in the bilateral lower extremities. Strength 5/5 in the lower extremities. Patient is ambulatory, though with antalgic gait that he states is due to his testicular and pelvic pain.  Psychiatric:        Mood and Affect: Mood and affect normal.        Speech: Speech normal.        Behavior: Behavior normal.     ED Results / Procedures / Treatments   Labs (all labs ordered are listed, but only abnormal results are displayed) Labs Reviewed  COMPREHENSIVE METABOLIC PANEL - Abnormal; Notable for the following components:      Result Value   Glucose, Bld 114 (*)    BUN 6 (*)    Albumin 3.3 (*)    All other components within normal limits  CBC - Abnormal; Notable for the following components:   RBC 3.72 (*)    MCV 107.3 (*)    MCH 34.9 (*)    Platelets 450 (*)    All other components within normal limits  LIPASE, BLOOD  URINALYSIS, ROUTINE W REFLEX MICROSCOPIC    EKG None  Radiology US  SCROTUM W/DOPPLER  Result Date: 11/10/2019 CLINICAL DATA:  Testicular pain. EXAM: SCROTAL ULTRASOUND DOPPLER ULTRASOUND OF THE TESTICLES TECHNIQUE: Complete ultrasound examination of the testicles, epididymis, and other scrotal structures was performed. Color and spectral Doppler ultrasound were also utilized to evaluate blood flow to the testicles. COMPARISON:  Ultrasound 11/07/2019 FINDINGS: Right testicle Measurements: 3.7 x 2.3 x 2.9 cm (volume = 13 cm^3). Testicle is striated and mildly heterogeneous in echotexture. Striations are similar comparison exam. Small anechoic benign 4 mm cyst noted just below the capsule. Left testicle Measurements: 4.0 x 1.8 x 2.4 cm (volume = 9 cm^3). Normal homogeneous echotexture small benign cyst cyst also noted in the LEFT testicle. Right epididymis:  Normal in size and appearance. Left epididymis:  Normal in size and appearance. Hydrocele:  None visualized. Varicocele:  None visualized. Pulsed Doppler interrogation of both testes demonstrates normal low resistance arterial and venous waveforms bilaterally. IMPRESSION: 1. Striated appearance of the RIGHT testicle could be a normal variation however recommend clinical correlation for orchitis of the RIGHT testicle. RIGHT testicle measures larger than the LEFT which also could indicate orchitis. 2. Normal LEFT testicle. 3. Normal color Doppler flow to the LEFT and RIGHT testicle. 4. Small benign appearing solitary cysts within LEFT and RIGHT testicle. Electronically Signed   By: Suzy Bouchard M.D.   On: 11/10/2019 14:19    Procedures Procedures (including critical care time)  Medications Ordered in ED Medications  oxyCODONE-acetaminophen (PERCOCET/ROXICET) 5-325 MG per tablet 2 tablet (2 tablets Oral Given 11/10/19 1254)  ibuprofen (ADVIL) tablet 600 mg (600 mg Oral Given 11/10/19 1254)    ED Course  I have reviewed the triage vital signs and the nursing notes.  Pertinent labs & imaging results that were available  during my care of the patient were reviewed by me and considered in my medical decision making (see chart for details).  Clinical Course as of Nov 11 1328  Mon Nov 10, 2019  1254  Spoke with Dr. Claudia Desanctis, urology. Repeat US to assure continued good blood flow to testicles.   [SJ]  45 Spoke with Dr. Claudia Desanctis regarding updated ultrasound results. Agrees with continuing doxycycline for the previously prescribed 7 days.  He can follow-up in the office.  No other requirements.   [SJ]    Clinical Course User Index [SJ] Cydne Grahn, Helane Gunther, PA-C   MDM Rules/Calculators/A&P                      Patient presents with continued pelvic and genital pain. Patient is nontoxic appearing, afebrile, not tachycardic, not tachypneic, not hypotensive, maintains excellent SPO2 on room air.  I have reviewed the patient's chart to obtain more information.  I reviewed the previous ED notes as well as imaging and lab results. I reviewed and interpreted the patient's labs and radiological studies. No leukocytosis.  Urinalysis without noted abnormalities. Ultrasound today with bilateral varicoceles and possible findings of orchitis.  Patient is already taking an appropriate antibiotic.  He will follow-up with urology in the office. The patient was given instructions for home care as well as return precautions. Patient voices understanding of these instructions, accepts the plan, and is comfortable with discharge.    Findings and plan of care discussed with Sherwood Gambler, MD.   Vitals:   11/10/19 1002 11/10/19 1503  BP: 140/82 137/90  Pulse: 84 77  Resp: 16 16  Temp: 97.9 F (36.6 C)   TempSrc: Oral   SpO2: 100% 99%  Weight: 59 kg      Final Clinical Impression(s) / ED Diagnoses Final diagnoses:  Inguinal pain, unspecified laterality  Pain in both testicles    Rx / DC Orders ED Discharge Orders         Ordered    oxyCODONE-acetaminophen (PERCOCET/ROXICET) 5-325 MG tablet  Every 6 hours PRN     11/10/19  1235    ondansetron (ZOFRAN ODT) 8 MG disintegrating tablet  Every 8 hours PRN     11/10/19 1526           Lorayne Bender, PA-C 11/12/19 1333    Sherwood Gambler, MD 11/15/19 [phone]

## 2019-11-11 ENCOUNTER — Ambulatory Visit: Payer: BC Managed Care – PPO | Attending: Internal Medicine | Admitting: Internal Medicine

## 2019-11-11 ENCOUNTER — Encounter: Payer: Self-pay | Admitting: Internal Medicine

## 2019-11-11 DIAGNOSIS — N50812 Left testicular pain: Secondary | ICD-10-CM | POA: Diagnosis not present

## 2019-11-11 DIAGNOSIS — N50811 Right testicular pain: Secondary | ICD-10-CM | POA: Diagnosis not present

## 2019-11-11 NOTE — Progress Notes (Signed)
Pt was in the shower and gave verbal consent to speak with girlfriend Tori   Pt girlfriend has questions regarding medications

## 2019-11-11 NOTE — Progress Notes (Signed)
Virtual Visit via Telephone Note Due to current restrictions/limitations of in-office visits due to the COVID-19 pandemic, this scheduled clinical appointment was converted to a telehealth visit  I connected with Travis Lutz on 11/11/19 at 12:12 p.m by telephone and verified that I am speaking with the correct person using two identifiers. I am in my office.  The patient is at home.  Only the patient, his fiance and myself participated in this encounter.  I discussed the limitations, risks, security and privacy concerns of performing an evaluation and management service by telephone and the availability of in person appointments. I also discussed with the patient that there may be a patient responsible charge related to this service. The patient expressed understanding and agreed to proceed.   History of Present Illness: Patient with history of EtOH abuse, tobacco dependence, chronic back pain, right femoral AVN.  He was last seen here in 2017 by Dr. Adrian Blackwater.  Purpose of today's visit is ER follow-up and to reestablish care  Patient has been seen in the emergency room 7 times since January with various complaints.  In mid February he was seen twice with symptoms suggestive of acute viral illness.  He tested negative for flu and Covid.  Chest x-ray was negative.  Seen in the ER again on 2/18 with complaint of nausea, dysuria and difficulty moving his bowel.  He was found to have UTI.  Treated with Keflex. Seen in the emergency room again 11/07/2019 with complaints of constant, worsening suprapubic and bilateral inguinal pain radiating into the testicles x1 week.  UA revealed moderate leukocytes esterase with greater than 50 WBCs but urine culture was negative.  Screen for HIV and RPR were negative.  Patient was treated empirically for chlamydia/gonorrhea with Rocephin and doxycycline.  However these cultures came back negative.  CAT scan of the abdomen/pelvis revealed no acute findings. Seen again  in the emergency room yesterday with continued pain in the testicles.  Testicular ultrasound reveals striated appearance of the right testicle which could be normal variant however orchitis could not be ruled out.  Right testicle measured larger than the left.  Today: Patient tells me that he has an appointment later today with [facility].  He apparently has seen them in the past for similar testicular pain. He denies any hematuria or problems passing his urine.  No dysuria or penile discharge.  He tells me that he has had chronic pain in the suprapubic area that runs into the testicles on both sides.  He has not noticed any swelling of the testicles.  He has numerous ER visits.  He wears a jockstrap but this does not seem to help.  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  He tells me he has not worked in the past 10 days due to this ongoing issue. His fiance had questions for me about several of the medications.  She wanted to know whether he still needed to take the [facility], on the Zyrtec and the doxycycline  Observations/Objective: Results for orders placed or performed during the hospital encounter of 11/10/19  Lipase, blood  Result Value Ref Range   Lipase 20 11 - 51 U/L  Comprehensive metabolic panel  Result Value Ref Range   Sodium 138 135 - 145 mmol/L   Potassium 4.6 3.5 - 5.1 mmol/L   Chloride 104 98 - 111 mmol/L   CO2 26 22 - 32 mmol/L   Glucose, Bld 114 (H) 70 - 99 mg/dL  BUN 6 (L) 8 - 23 mg/dL   Creatinine, Ser 0.75 0.61 - 1.24 mg/dL   Calcium 8.9 8.9 - 10.3 mg/dL   Total Protein 6.9 6.5 - 8.1 g/dL   Albumin 3.3 (L) 3.5 - 5.0 g/dL   AST 24 15 - 41 U/L   ALT 24 0 - 44 U/L   Alkaline Phosphatase 68 38 - 126 U/L   Total Bilirubin 0.4 0.3 - 1.2 mg/dL   GFR calc non Af Amer >60 >60 mL/min   GFR calc Af Amer >60 >60 mL/min   Anion gap 8 5 - 15  CBC  Result Value Ref Range   WBC 7.1 4.0 - 10.5 K/uL   RBC 3.72 (L) 4.22 - 5.81 MIL/uL   Hemoglobin  13.0 13.0 - 17.0 g/dL   HCT 39.9 39.0 - 52.0 %   MCV 107.3 (H) 80.0 - 100.0 fL   MCH 34.9 (H) 26.0 - 34.0 pg   MCHC 32.6 30.0 - 36.0 g/dL   RDW 14.4 11.5 - 15.5 %   Platelets 450 (H) 150 - 400 K/uL   nRBC 0.0 0.0 - 0.2 %  Urinalysis, Routine w reflex microscopic  Result Value Ref Range   Color, Urine YELLOW YELLOW   APPearance CLEAR CLEAR   Specific Gravity, Urine 1.016 1.005 - 1.030   pH 7.0 5.0 - 8.0   Glucose, UA NEGATIVE NEGATIVE mg/dL   Hgb urine dipstick NEGATIVE NEGATIVE   Bilirubin Urine NEGATIVE NEGATIVE   Ketones, ur NEGATIVE NEGATIVE mg/dL   Protein, ur NEGATIVE NEGATIVE mg/dL   Nitrite NEGATIVE NEGATIVE   Leukocytes,Ua NEGATIVE NEGATIVE     Assessment and Plan: 1. Pain in both testicles -Treated successfully for UTI but still with ongoing testicular pain.  He will keep his appointment with the urologist today.  Advised him to let the urologist know that he had testicular ultrasound yesterday so that he can take a look at it and advised.   Follow Up Instructions: 1 mth in person   I discussed the assessment and treatment plan with the patient. The patient was provided an opportunity to ask questions and all were answered. The patient agreed with the plan and demonstrated an understanding of the instructions.   The patient was advised to call back or seek an in-person evaluation if the symptoms worsen or if the condition fails to improve as anticipated.  I provided 12 minutes of non-face-to-face time during this encounter.   Karle Plumber, MD

## 2019-11-28 ENCOUNTER — Other Ambulatory Visit: Payer: Self-pay

## 2019-11-28 ENCOUNTER — Encounter: Payer: Self-pay | Admitting: Internal Medicine

## 2019-11-28 ENCOUNTER — Ambulatory Visit: Payer: BC Managed Care – PPO | Attending: Internal Medicine | Admitting: Internal Medicine

## 2019-11-28 VITALS — BP 136/82 | HR 79 | Temp 97.7°F | Resp 16 | Wt 123.2 lb

## 2019-11-28 DIAGNOSIS — F17210 Nicotine dependence, cigarettes, uncomplicated: Secondary | ICD-10-CM | POA: Diagnosis not present

## 2019-11-28 DIAGNOSIS — L84 Corns and callosities: Secondary | ICD-10-CM

## 2019-11-28 DIAGNOSIS — N50819 Testicular pain, unspecified: Secondary | ICD-10-CM | POA: Diagnosis not present

## 2019-11-28 DIAGNOSIS — Z1211 Encounter for screening for malignant neoplasm of colon: Secondary | ICD-10-CM | POA: Diagnosis not present

## 2019-11-28 DIAGNOSIS — N509 Disorder of male genital organs, unspecified: Secondary | ICD-10-CM

## 2019-11-28 DIAGNOSIS — F17200 Nicotine dependence, unspecified, uncomplicated: Secondary | ICD-10-CM

## 2019-11-28 MED ORDER — ACETAMINOPHEN 500 MG PO TABS: 500.0000 mg | ORAL_TABLET | Freq: Four times a day (QID) | ORAL | 0 refills | Status: DC | PRN

## 2019-11-28 MED ORDER — VARENICLINE TARTRATE 0.5 MG PO TABS: 0.5000 mg | ORAL_TABLET | Freq: Two times a day (BID) | ORAL | 0 refills | Status: DC

## 2019-11-28 NOTE — Patient Instructions (Signed)
I have referred you to the podiatrist for the callus on your feet.  I have referred you to the gastroenterologist for the colon cancer screening.  Your blood pressure is elevated.  Try to cut back on salt in the foods.  We will bring him back in several weeks to recheck your blood pressure.  I have sent the prescription to the pharmacy for the Chantix to help you quit smoking.

## 2019-11-28 NOTE — Progress Notes (Signed)
Patient ID: Travis Lutz, male    DOB: June 02, 1956  MRN: [id number]  CC: Hospitalization Follow-up (Urgent care) and FMLA   Subjective: Travis Lutz is a 64 y.o. male who presents for f/u groin pain and to request FMLA form completed His concerns today include:  Patient with history of EtOH abuse, tobacco dependence, chronic back pain, right femoral AVN.  He was last seen here in 2017 by Dr. Adrian Blackwater.  I did a telemedicine visit with the patient earlier this month where he reestablish care.  At that time he was having frequent ER visits with various complaints.  His last ER visit centered around pain in the testicles. -He tells me that he has had chronic issues with suprapubic pain that radiates into the testicles.  He has pain every day and sometimes can last hours.  The pain is worse when he has to lift heavy things on his job.  He works at [employer] in [employer].  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  He has to push the fryer outside to empty out the oil.  It weighs about 90 pounds when is filled with oil.  He plans to retire 01/2020.  He has been out of work since 10/15/2019 due to acute issues with pain.  He would like to return to work 12/02/2019. He saw the urologist 11/11/2019 and states he was told that there is nothing further for them to do.   BP elev today.  He thinks it is elevated because he rode his bike 4-5 miles today to get here because his car gave out on him.  He denies any issues with blood pressure in the past and has not been on medications.  He is requesting referral to a podiatrist.  He has painful callus on both big toes.  He tries to keep them shave himself.  Tobacco dependence: He smokes about 1 pack a week.  He is smoked since the age of 77.  When he was younger he was smoking up to 2 packs a day.  He would like to quit and is trying to do so.  He has tried the nicotine patches in the past and did not find them  helpful.  Patient Active Problem List   Diagnosis Date Noted  . Dyspnea 02/29/2016  . Chronic low back pain 12/06/2015  . Bilateral chronic knee pain 12/06/2015  . Foot pain, bilateral 12/06/2015  . Vitamin D deficiency 11/16/2015  . Migraine headache 11/15/2015  . Testicular pain 11/15/2015  . 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 Homeless single person 11/15/2015  . Smoker 11/15/2015  . Alcohol use disorder 11/15/2015     Current Outpatient Medications on File Prior to Visit  Medication Sig Dispense Refill  . acetaminophen (TYLENOL) 500 MG tablet Take 1 tablet (500 mg total) by mouth every 6 (six) hours as needed. (Patient not taking: Reported on 11/28/2019) 30 tablet 0  . albuterol (PROVENTIL HFA;VENTOLIN HFA) 108 (90 Base) MCG/ACT inhaler Inhale 1-2 puffs into the lungs every 6 (six) hours as needed for wheezing or shortness of breath. (Patient not taking: Reported on 11/28/2019) 54 g 3  . cetirizine (ZYRTEC ALLERGY) 10 MG tablet Take 1 tablet (10 mg total) by mouth daily. (Patient not taking: Reported on 11/28/2019) 90 tablet 0  . cyclobenzaprine (FLEXERIL) 10 MG tablet Take 1 tablet (10 mg total) by mouth 2 (two) times daily as  needed for muscle spasms. (Patient not taking: Reported on 11/28/2019) 20 tablet 0  . dicyclomine (BENTYL) 20 MG tablet Take 1 tablet (20 mg total) by mouth 2 (two) times daily. (Patient not taking: Reported on 11/28/2019) 20 tablet 0  . ondansetron (ZOFRAN ODT) 8 MG disintegrating tablet Take 1 tablet (8 mg total) by mouth every 8 (eight) hours as needed for nausea or vomiting. (Patient not taking: Reported on 11/28/2019) 20 tablet 0  . oxyCODONE-acetaminophen (PERCOCET/ROXICET) 5-325 MG tablet Take 1-2 tablets by mouth every 6 (six) hours as needed for severe pain. (Patient not taking: Reported on 11/28/2019) 15 tablet 0  . promethazine-dextromethorphan (PROMETHAZINE-DM) 6.25-15 MG/5ML syrup Take 5 mLs by mouth at bedtime as needed for cough.  (Patient not taking: Reported on 11/28/2019) 100 mL 0  . pseudoephedrine (SUDAFED) 30 MG tablet Take 1 tablet (30 mg total) by mouth every 8 (eight) hours as needed for congestion. (Patient not taking: Reported on 11/28/2019) 30 tablet 0   No current facility-administered medications on file prior to visit.    No Known Allergies  Social History   Socioeconomic History  . Marital status: Significant Other    Spouse name: Not on file  . Number of children: Not on file  . Years of education: Not on file  . Highest education level: Not on file  Occupational History  . Not on file  Tobacco Use  . Smoking status: Current Every Day Smoker    Packs/day: 0.25    Types: Cigarettes  . Smokeless tobacco: Never Used  Substance and Sexual Activity  . Alcohol use: Yes    Alcohol/week: 0.0 standard drinks    Comment: beer daily   . Drug use: Yes    Types: Marijuana, Cocaine    Comment: marijuana yesterday, cocaine last week  . Sexual activity: Yes  Other Topics Concern  . Not on file  Social History Narrative  . Not on file   Social Determinants of Health   Financial Resource Strain:   . Difficulty of Paying Living Expenses:   Food Insecurity:   . Worried About Charity fundraiser in the Last Year:   . Arboriculturist in the Last Year:   Transportation Needs:   . Film/video editor (Medical):   Marland Kitchen Lack of Transportation (Non-Medical):   Physical Activity:   . Days of Exercise per Week:   . Minutes of Exercise per Session:   Stress:   . Feeling of Stress :   Social Connections:   . Frequency of Communication with Friends and Family:   . Frequency of Social Gatherings with Friends and Family:   . Attends Religious Services:   . Active Member of Clubs or Organizations:   . Attends Archivist Meetings:   Marland Kitchen Marital Status:   Intimate Partner Violence:   . Fear of Current or Ex-Partner:   . Emotionally Abused:   Marland Kitchen Physically Abused:   . Sexually Abused:      Family History  Family history unknown: Yes    Past Surgical History:  Procedure Laterality Date  . REPAIR OF PERFORATED ULCER      ROS: Review of Systems Negative except as stated above  PHYSICAL EXAM: BP (!) 147/94   Pulse 79   Temp 97.7 F (36.5 C)   Resp 16   Wt 123 lb 3.2 oz (55.9 kg)   SpO2 100%   BMI 16.25 kg/m   Physical Exam  General appearance - alert, well appearing, older  African-American male and in no distress Mental status - normal mood, behavior, speech, dress, motor activity, and thought processes Mouth - mucous membranes moist, pharynx normal without lesions Neck - supple, no significant adenopathy Chest - clear to auscultation, no wheezes, rales or rhonchi, symmetric air entry Heart - normal rate, regular rhythm, normal S1, S2, no murmurs, rubs, clicks or gallops Abdomen - soft, nontender, nondistended, no masses or organomegaly GU Male -CMA Sallyanne Havers present: No scrotal edema noted.  No inguinal hernia appreciated.  Slight tenderness on palpation of the testicles.  No abnormal masses felt. Extremities -no lower extremity edema. Feet: Small calluses noted on the medial aspect of both big toes. CMP Latest Ref Rng & Units 11/10/2019 11/07/2019 10/29/2019  Glucose 70 - 99 mg/dL 114(H) 105(H) 106(H)  BUN 8 - 23 mg/dL 6(L) 10 9  Creatinine 0.61 - 1.24 mg/dL 0.75 0.82 1.05  Sodium 135 - 145 mmol/L 138 144 134(L)  Potassium 3.5 - 5.1 mmol/L 4.6 4.5 4.7  Chloride 98 - 111 mmol/L 104 108 98  CO2 22 - 32 mmol/L 26 26 23   Calcium 8.9 - 10.3 mg/dL 8.9 9.0 9.5  Total Protein 6.5 - 8.1 g/dL 6.9 7.1 7.9  Total Bilirubin 0.3 - 1.2 mg/dL 0.4 0.2(L) 0.9  Alkaline Phos 38 - 126 U/L 68 83 73  AST 15 - 41 U/L 24 21 21   ALT 0 - 44 U/L 24 25 24    Lipid Panel  No results found for: CHOL, TRIG, HDL, CHOLHDL, VLDL, LDLCALC, LDLDIRECT  CBC    Component Value Date/Time   WBC 7.1 11/10/2019 1006   RBC 3.72 (L) 11/10/2019 1006   HGB 13.0 11/10/2019 1006   HCT 39.9  11/10/2019 1006   PLT 450 (H) 11/10/2019 1006   MCV 107.3 (H) 11/10/2019 1006   MCH 34.9 (H) 11/10/2019 1006   MCHC 32.6 11/10/2019 1006   RDW 14.4 11/10/2019 1006   LYMPHSABS 1.2 11/07/2019 0847   MONOABS 1.0 11/07/2019 0847   EOSABS 0.1 11/07/2019 0847   BASOSABS 0.1 11/07/2019 0847    ASSESSMENT AND PLAN: 1. Persistent testicular pain -Of questionable etiology.  No hernia appreciated on exam.  Patient has had testicular ultrasound and CT of the abdomen and pelvis.  He will try to avoid heavy lifting.  I will complete his FMLA form allowing him to return to work next week. - acetaminophen (TYLENOL) 500 MG tablet; Take 1 tablet (500 mg total) by mouth every 6 (six) hours as needed.  Dispense: 60 tablet; Refill: 0  2. Tobacco dependence Advised to quit.  Discussed health risks associated with smoking.  Patient ready to give a trial of quitting.  We discussed methods to help him quit.  He would like to try Chantix.  I went over possible side effects of Chantix including mood swings and bad dreams.  Less than 5 minutes spent on counseling. - varenicline (CHANTIX) 0.5 MG tablet; Take 1 tablet (0.5 mg total) by mouth 2 (two) times daily.  Dispense: 60 tablet; Refill: 0  3. Pre-ulcerative corn or callous - Ambulatory referral to Podiatry  4. Screening for colon cancer He is overdue for colon cancer screening.  Discussed methods used for colon cancer screening.  He is agreeable and wanting to do the colonoscopy. - Ambulatory referral to Gastroenterology    Patient was given the opportunity to ask questions.  Patient verbalized understanding of the plan and was able to repeat key elements of the plan.   No orders of the defined types  were placed in this encounter.    Requested Prescriptions    No prescriptions requested or ordered in this encounter    No follow-ups on file.  Karle Plumber, MD, FACP

## 2019-12-03 ENCOUNTER — Telehealth: Payer: Self-pay

## 2019-12-03 NOTE — Telephone Encounter (Signed)
Contacted pt and made aware that his form for work was faxed and he can pick up original up front pt doesn't have any questions or concerns

## 2019-12-04 ENCOUNTER — Telehealth: Payer: Self-pay | Admitting: General Practice

## 2019-12-04 NOTE — Telephone Encounter (Signed)
Pt needs a note stating he can come back to work on Monday faxed to his job.

## 2019-12-04 NOTE — Telephone Encounter (Signed)
Will forward to pcp

## 2019-12-05 NOTE — Telephone Encounter (Signed)
Returned pt call and pt didn't answer and was unable to lvm due to vm being full

## 2019-12-10 ENCOUNTER — Telehealth: Payer: Self-pay | Admitting: General Practice

## 2019-12-10 NOTE — Telephone Encounter (Signed)
Morley Kos [phone]  And HR Minie [phone]  Ext [phone]  Pts supervisor called about paper work and if there are return to work restriction. please fu with joe and Mine at the numbers given  Call at anytime

## 2019-12-10 NOTE — Telephone Encounter (Signed)
Pt Manager contacted the office and stated pt is needing a letter to release him back to work w/wo restrictions. Would like letter faxed to [phone] Attn: Jeannett Senior

## 2019-12-10 NOTE — Telephone Encounter (Signed)
Returned Minie call and lvm asking her to return a call at her earliest convenience

## 2019-12-11 ENCOUNTER — Telehealth: Payer: Self-pay | Admitting: Internal Medicine

## 2019-12-11 NOTE — Telephone Encounter (Signed)
Patient fiance called saying that a leave of absence form was filled out for the patient but the form did not have a return to date. Patient employer is requesting a letter saying when the patient can return to work and if he will be having any restrictions. Please f/u

## 2019-12-15 NOTE — Telephone Encounter (Signed)
Contacted pt and made aware that letter has been faxed to employer. Pt doesn't have any questions or concerns

## 2019-12-17 ENCOUNTER — Telehealth: Payer: Self-pay | Admitting: Internal Medicine

## 2019-12-17 NOTE — Telephone Encounter (Signed)
Refaxed letter this afternoon.

## 2019-12-17 NOTE — Telephone Encounter (Signed)
Patients place of work called and stated that the return to work letter that was sent to their office only sent one page. Please resend patient's Return to work letter to fax number:[phone] and attention it to [employer]. Please follow up at your earliest convenience.

## 2019-12-26 ENCOUNTER — Ambulatory Visit: Payer: Self-pay | Admitting: Podiatry

## 2019-12-30 ENCOUNTER — Encounter: Payer: Self-pay | Admitting: Gastroenterology

## 2020-01-10 ENCOUNTER — Other Ambulatory Visit: Payer: Self-pay

## 2020-01-10 ENCOUNTER — Ambulatory Visit (HOSPITAL_COMMUNITY)
Admission: EM | Admit: 2020-01-10 | Discharge: 2020-01-10 | Disposition: A | Discharge status: Home / Self Care | Payer: BC Managed Care – PPO | Attending: Family Medicine | Admitting: Family Medicine

## 2020-01-10 DIAGNOSIS — R220 Localized swelling, mass and lump, head: Secondary | ICD-10-CM | POA: Diagnosis not present

## 2020-01-10 DIAGNOSIS — K047 Periapical abscess without sinus: Secondary | ICD-10-CM

## 2020-01-10 DIAGNOSIS — H5789 Other specified disorders of eye and adnexa: Secondary | ICD-10-CM

## 2020-01-10 MED ORDER — KETOROLAC TROMETHAMINE 30 MG/ML IJ SOLN
30.0000 mg | Freq: Once | INTRAMUSCULAR | Status: AC
  Administered 2020-01-10: 30 mg via INTRAMUSCULAR

## 2020-01-10 MED ORDER — KETOROLAC TROMETHAMINE 30 MG/ML IJ SOLN
INTRAMUSCULAR | Status: AC
  Filled 2020-01-10: qty 1

## 2020-01-10 MED ORDER — HYDROCODONE-ACETAMINOPHEN 5-325 MG PO TABS: 2.0000 | ORAL_TABLET | ORAL | 0 refills | Status: DC | PRN

## 2020-01-10 MED ORDER — CLINDAMYCIN HCL 150 MG PO CAPS: 150.0000 mg | ORAL_CAPSULE | Freq: Three times a day (TID) | ORAL | 0 refills | Status: AC

## 2020-01-10 NOTE — ED Triage Notes (Signed)
Pt c/o right sided facial swelling and dental pain since Thursday.

## 2020-01-10 NOTE — Discharge Instructions (Addendum)
You have a dental infection.  I have sent clindamycin to your pharmacy for you to take 3 times a day for 7 days.  You have also received an injection for pain in the office today.  Do not take any ibuprofen for the next 6 hours.  I have sent in [place] for you to take 1 tablet every 6 hours as needed for moderate to severe pain.  Follow-up with dentistry to have a tooth pulled.  I would have you get some over-the-counter saline eyedrops for the irritation in your left eye.

## 2020-01-10 NOTE — ED Provider Notes (Signed)
[facility]    CSN: [id number] Arrival date & time: 01/10/20  1034      History   Chief Complaint Chief Complaint  Patient presents with  . Facial Swelling    HPI Travis Lutz is a 64 y.o. male.   Reports that he has an abscessed tooth on his top right molar.  Reports that he is having trouble eating on that side.  Reports that he is having facial swelling right cheek.  Reports that he ate carrot cake last week, and that it has been hurting since then.  Reports that the pain has been worse over the last 24 hours.  Denies having dental insurance or ability to see a dentist.  Reports that he has he is going to continue to have pain and infection until he is able to get his tooth pulled.  Denies cough, shortness of breath, nausea, vomiting, diarrhea, rash, fever, other symptoms.  ROS per HPI    The history is provided by the patient.    No past medical history on file.  Patient Active Problem List   Diagnosis Date Noted  . Dyspnea 02/29/2016  . Chronic low back pain 12/06/2015  . Bilateral chronic knee pain 12/06/2015  . Foot pain, bilateral 12/06/2015  . Vitamin D deficiency 11/16/2015  . Migraine headache 11/15/2015  . Testicular pain 11/15/2015  . 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 Homeless single person 11/15/2015  . Smoker 11/15/2015  . Alcohol use disorder 11/15/2015    Past Surgical History:  Procedure Laterality Date  . REPAIR OF PERFORATED ULCER         Home Medications    Prior to Admission medications   Medication Sig Start Date End Date Taking? Authorizing Provider  acetaminophen (TYLENOL) 500 MG tablet Take 1 tablet (500 mg total) by mouth every 6 (six) hours as needed. 11/28/19   Ladell Pier, MD  clindamycin (CLEOCIN) 150 MG capsule Take 1 capsule (150 mg total) by mouth 3 (three) times daily for 7 days. 01/10/20 01/17/20  Faustino Congress, NP  HYDROcodone-acetaminophen (NORCO/VICODIN) 5-325 MG tablet Take 2  tablets by mouth every 4 (four) hours as needed. 01/10/20   Faustino Congress, NP  varenicline (CHANTIX) 0.5 MG tablet Take 1 tablet (0.5 mg total) by mouth 2 (two) times daily. 11/28/19   Ladell Pier, MD    Family History Family History  Family history unknown: Yes    Social History Social History   Tobacco Use  . Smoking status: Current Every Day Smoker    Packs/day: 0.25    Types: Cigarettes  . Smokeless tobacco: Never Used  Substance Use Topics  . Alcohol use: Yes    Alcohol/week: 0.0 standard drinks    Comment: beer daily   . Drug use: Yes    Types: Marijuana, Cocaine    Comment: marijuana yesterday, cocaine last week     Allergies   Patient has no known allergies.   Review of Systems Review of Systems   Physical Exam Triage Vital Signs ED Triage Vitals  Enc Vitals Group     BP 01/10/20 1116 135/88     Pulse Rate 01/10/20 1116 (!) 56     Resp 01/10/20 1114 18     Temp 01/10/20 1114 98 F (36.7 C)     Temp src --      SpO2 01/10/20 1116 98 %     Weight --      Height --  Head Circumference --      Peak Flow --      Pain Score 01/10/20 1115 10     Pain Loc --      Pain Edu? --      Excl. in [place]? --    No data found.  Updated Vital Signs BP 135/88   Pulse (!) 56   Temp 98 F (36.7 C)   Resp 18   SpO2 98%   Visual Acuity Right Eye Distance:   Left Eye Distance:   Bilateral Distance:    Right Eye Near:   Left Eye Near:    Bilateral Near:     Physical Exam Vitals and nursing note reviewed.  Constitutional:      General: He is not in acute distress.    Appearance: Normal appearance. He is well-developed. He is ill-appearing.  HENT:     Head: Normocephalic and atraumatic.     Right Ear: Tympanic membrane normal.     Left Ear: Tympanic membrane normal.     Nose: Nose normal.     Mouth/Throat:     Mouth: Mucous membranes are moist.     Dentition: Abnormal dentition. Dental tenderness, gingival swelling, dental caries and dental  abscesses present.     Pharynx: Oropharynx is clear.      Comments: Gingival swelling in areas marked in red on the above diagram the upper right. Eyes:     Extraocular Movements: Extraocular movements intact.     Conjunctiva/sclera: Conjunctivae normal.     Pupils: Pupils are equal, round, and reactive to light.  Cardiovascular:     Rate and Rhythm: Normal rate and regular rhythm.     Heart sounds: No murmur.  Pulmonary:     Effort: Pulmonary effort is normal. No respiratory distress.     Breath sounds: Normal breath sounds.  Abdominal:     General: Bowel sounds are normal.     Palpations: Abdomen is soft.     Tenderness: There is no abdominal tenderness.  Musculoskeletal:     Cervical back: Normal range of motion and neck supple.  Lymphadenopathy:     Cervical: No cervical adenopathy.  Skin:    General: Skin is warm and dry.  Neurological:     Mental Status: He is alert.      UC Treatments / Results  Labs (all labs ordered are listed, but only abnormal results are displayed) Labs Reviewed - No data to display  EKG   Radiology No results found.  Procedures Procedures (including critical care time)  Medications Ordered in UC Medications  ketorolac (TORADOL) 30 MG/ML injection 30 mg (30 mg Intramuscular Given 01/10/20 1135)    Initial Impression / Assessment and Plan / UC Course  I have reviewed the triage vital signs and the nursing notes.  Pertinent labs & imaging results that were available during my care of the patient were reviewed by me and considered in my medical decision making (see chart for details).     Right facial swelling Dental abscess: Presents with dental pain and absces that has been increasingly worse over the last 2 days.  On exam, patient is in so much pain he cannot open his mouth wide enough for inspection exam.  Noted right sided facial swelling at the right cheek.  Patient does not appear to be in acute distress, lungs CTA bilaterally,  heart sounds normal S1-S2.  Prescribed clindamycin 3 times a day for the next 7 days.  Instructed patient to take this  medication.  Also prescribed Norco 1 tablet every 6 hours as needed for moderate to severe pain.  Toradol 30 mg IM given in office today.  Instructed directed patient to follow-up with dentistry.  Left eye irritation: On exam, patient has redness to the sclera of the left eye.  Instructed patient he may use over-the-counter Visine for red eyes.  If he is continuing to have issues or symptoms are worsening he can follow-up with this office or with ophthalmology.  Patient verbalized understanding is in agreement treatment plan. Final Clinical Impressions(s) / UC Diagnoses   Final diagnoses:  Dental abscess  Right facial swelling  Irritation of left eye     Discharge Instructions     You have a dental infection.  I have sent clindamycin to your pharmacy for you to take 3 times a day for 7 days.  You have also received an injection for pain in the office today.  Do not take any ibuprofen for the next 6 hours.  I have sent in [place] for you to take 1 tablet every 6 hours as needed for moderate to severe pain.  Follow-up with dentistry to have a tooth pulled.  I would have you get some over-the-counter saline eyedrops for the irritation in your left eye.    ED Prescriptions    Medication Sig Dispense Auth. Provider   clindamycin (CLEOCIN) 150 MG capsule Take 1 capsule (150 mg total) by mouth 3 (three) times daily for 7 days. 21 capsule Faustino Congress, NP   HYDROcodone-acetaminophen (NORCO/VICODIN) 5-325 MG tablet Take 2 tablets by mouth every 4 (four) hours as needed. 10 tablet Faustino Congress, NP     I have reviewed the PDMP during this encounter.   Faustino Congress, NP 01/10/20 1146

## 2020-01-15 ENCOUNTER — Telehealth: Payer: Self-pay

## 2020-01-15 ENCOUNTER — Ambulatory Visit: Payer: BC Managed Care – PPO | Admitting: Podiatry

## 2020-01-15 NOTE — Telephone Encounter (Signed)
NS to PV.  Called and left message for r/c by 5:00 or appt would need to be r/s.

## 2020-01-15 NOTE — Telephone Encounter (Signed)
Pt did not call by 5:00.  Current appts cancelled until such a time as pt calls back to reschedule.

## 2020-01-29 ENCOUNTER — Encounter: Payer: BC Managed Care – PPO | Admitting: Gastroenterology

## 2020-04-10 ENCOUNTER — Encounter (HOSPITAL_COMMUNITY): Payer: Self-pay

## 2020-04-10 ENCOUNTER — Ambulatory Visit (HOSPITAL_COMMUNITY)
Admission: EM | Admit: 2020-04-10 | Discharge: 2020-04-10 | Disposition: A | Discharge status: Home / Self Care | Payer: Self-pay | Attending: Physician Assistant | Admitting: Physician Assistant

## 2020-04-10 DIAGNOSIS — H60312 Diffuse otitis externa, left ear: Secondary | ICD-10-CM

## 2020-04-10 MED ORDER — CIPRO HC 0.2-1 % OT SUSP: 3.0000 [drp] | Freq: Two times a day (BID) | OTIC | 0 refills | Status: DC

## 2020-04-10 NOTE — ED Provider Notes (Signed)
[facility]    CSN: [id number] Arrival date & time: 04/10/20  1117      History   Chief Complaint Chief Complaint  Patient presents with  . Otalgia    HPI BENARD MINTURN is a 64 y.o. male.   Pt complains of left ear pain that started 2 weeks ago.  He initially thought he was experiencing improvement after about one week, but now the pain has become much worse.  He reports "muffled" hearing.  Reports the pain started after he went swimming.  Denies fever, chills, nasal congestion, sinus pain/pressure, loss of hearing.  He has tried Tylenol with no relief.  He reports he started swimming several times a week recently.         History reviewed. No pertinent past medical history.  Patient Active Problem List   Diagnosis Date Noted  . Dyspnea 02/29/2016  . Chronic low back pain 12/06/2015  . Bilateral chronic knee pain 12/06/2015  . Foot pain, bilateral 12/06/2015  . Vitamin D deficiency 11/16/2015  . Migraine headache 11/15/2015  . Testicular pain 11/15/2015  . 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 Homeless single person 11/15/2015  . Smoker 11/15/2015  . Alcohol use disorder 11/15/2015    Past Surgical History:  Procedure Laterality Date  . REPAIR OF PERFORATED ULCER         Home Medications    Prior to Admission medications   Medication Sig Start Date End Date Taking? Authorizing Provider  acetaminophen (TYLENOL) 500 MG tablet Take 1 tablet (500 mg total) by mouth every 6 (six) hours as needed. 11/28/19   Ladell Pier, MD  HYDROcodone-acetaminophen (NORCO/VICODIN) 5-325 MG tablet Take 2 tablets by mouth every 4 (four) hours as needed. 01/10/20   Faustino Congress, NP  varenicline (CHANTIX) 0.5 MG tablet Take 1 tablet (0.5 mg total) by mouth 2 (two) times daily. 11/28/19   Ladell Pier, MD    Family History Family History  Family history unknown: Yes    Social History Social History   Tobacco Use  . Smoking  status: Current Every Day Smoker    Packs/day: 0.25    Types: Cigarettes  . Smokeless tobacco: Never Used  Vaping Use  . Vaping Use: Never used  Substance Use Topics  . Alcohol use: Yes    Alcohol/week: 0.0 standard drinks    Comment: beer daily   . Drug use: Yes    Types: Marijuana, Cocaine    Comment: marijuana yesterday, cocaine last week     Allergies   Patient has no known allergies.   Review of Systems Review of Systems  Constitutional: Negative for chills and fever.  HENT: Positive for ear pain (left ear pain). Negative for congestion, rhinorrhea, sinus pressure, sinus pain and sore throat.   Eyes: Negative for pain and visual disturbance.  Respiratory: Negative for cough and shortness of breath.   Cardiovascular: Negative for chest pain and palpitations.  Gastrointestinal: Negative for abdominal pain and vomiting.  Genitourinary: Negative for dysuria and hematuria.  Musculoskeletal: Negative for arthralgias and back pain.  Skin: Negative for color change and rash.  Neurological: Negative for seizures and syncope.  All other systems reviewed and are negative.    Physical Exam Triage Vital Signs ED Triage Vitals  Enc Vitals Group     BP 04/10/20 1244 (!) 160/81     Pulse Rate 04/10/20 1244 77     Resp 04/10/20 1244 16     Temp 04/10/20  1244 98.8 F (37.1 C)     Temp Source 04/10/20 1244 Oral     SpO2 04/10/20 1244 100 %     Weight --      Height --      Head Circumference --      Peak Flow --      Pain Score 04/10/20 1245 9     Pain Loc --      Pain Edu? --      Excl. in [place]? --    No data found.  Updated Vital Signs BP (!) 160/81 (BP Location: Right Arm)   Pulse 77   Temp 98.8 F (37.1 C) (Oral)   Resp 16   SpO2 100%   Visual Acuity Right Eye Distance:   Left Eye Distance:   Bilateral Distance:    Right Eye Near:   Left Eye Near:    Bilateral Near:     Physical Exam Vitals and nursing note reviewed.  Constitutional:      Appearance:  He is well-developed.  HENT:     Head: Normocephalic and atraumatic.     Right Ear: Hearing, tympanic membrane, ear canal and external ear normal.     Left Ear: Decreased hearing noted. Swelling and tenderness present. No drainage. No mastoid tenderness.     Ears:     Comments: Unable to visualize left TM fully due to canal swelling.   Eyes:     Conjunctiva/sclera: Conjunctivae normal.  Cardiovascular:     Rate and Rhythm: Normal rate and regular rhythm.     Heart sounds: No murmur heard.   Pulmonary:     Effort: Pulmonary effort is normal. No respiratory distress.     Breath sounds: Normal breath sounds.  Abdominal:     Palpations: Abdomen is soft.     Tenderness: There is no abdominal tenderness.  Musculoskeletal:     Cervical back: Neck supple.  Skin:    General: Skin is warm and dry.  Neurological:     Mental Status: He is alert.      UC Treatments / Results  Labs (all labs ordered are listed, but only abnormal results are displayed) Labs Reviewed - No data to display  EKG   Radiology No results found.  Procedures Procedures (including critical care time)  Medications Ordered in UC Medications - No data to display  Initial Impression / Assessment and Plan / UC Course  I have reviewed the triage vital signs and the nursing notes.  Pertinent labs & imaging results that were available during my care of the patient were reviewed by me and considered in my medical decision making (see chart for details).     Otitis externa.  Will send in cipro [facility] topical antibiotic.  Pt advised to return for follow up if no improvement.  Advised to discontinue swimming until resolution of sx.   Final Clinical Impressions(s) / UC Diagnoses   Final diagnoses:  None   Discharge Instructions   None    ED Prescriptions    None     PDMP not reviewed this encounter.   Konrad Felix, PA-C 04/12/20 1539

## 2020-04-10 NOTE — ED Triage Notes (Signed)
Pt presents with left ear pain and swelling x 2 weeks. Denies fever, chills. Tylenol gives not relief.

## 2020-04-10 NOTE — Discharge Instructions (Addendum)
Use drops as prescribed.   Can take Ibuprofen as needed for discomfort Return if no improvement

## 2020-09-08 ENCOUNTER — Emergency Department (HOSPITAL_COMMUNITY): Payer: Self-pay

## 2020-09-08 ENCOUNTER — Other Ambulatory Visit: Payer: Self-pay

## 2020-09-08 ENCOUNTER — Emergency Department (HOSPITAL_COMMUNITY)
Admission: EM | Admit: 2020-09-08 | Discharge: 2020-09-08 | Disposition: A | Discharge status: Home / Self Care | Payer: Self-pay | Attending: Emergency Medicine | Admitting: Emergency Medicine

## 2020-09-08 DIAGNOSIS — M79641 Pain in right hand: Secondary | ICD-10-CM | POA: Insufficient documentation

## 2020-09-08 DIAGNOSIS — F17210 Nicotine dependence, cigarettes, uncomplicated: Secondary | ICD-10-CM | POA: Insufficient documentation

## 2020-09-08 LAB — CBC
HCT: 43.4 % (ref 39.0–52.0)
Hemoglobin: 14.4 g/dL (ref 13.0–17.0)
MCH: 34.6 pg — ABNORMAL HIGH (ref 26.0–34.0)
MCHC: 33.2 g/dL (ref 30.0–36.0)
MCV: 104.3 fL — ABNORMAL HIGH (ref 80.0–100.0)
Platelets: 218 10*3/uL (ref 150–400)
RBC: 4.16 MIL/uL — ABNORMAL LOW (ref 4.22–5.81)
RDW: 14.6 % (ref 11.5–15.5)
WBC: 5.3 10*3/uL (ref 4.0–10.5)
nRBC: 0 % (ref 0.0–0.2)

## 2020-09-08 LAB — BASIC METABOLIC PANEL
Anion gap: 11 (ref 5–15)
BUN: 8 mg/dL (ref 8–23)
CO2: 23 mmol/L (ref 22–32)
Calcium: 9.2 mg/dL (ref 8.9–10.3)
Chloride: 104 mmol/L (ref 98–111)
Creatinine, Ser: 0.8 mg/dL (ref 0.61–1.24)
GFR, Estimated: >60 mL/min (ref >60)
Glucose, Bld: 104 mg/dL — ABNORMAL HIGH (ref 70–99)
Potassium: 3.8 mmol/L (ref 3.5–5.1)
Sodium: 138 mmol/L (ref 135–145)

## 2020-09-08 LAB — URIC ACID: Uric Acid, Serum: 3.9 mg/dL (ref 3.7–8.6)

## 2020-09-08 MED ORDER — NAPROXEN 500 MG PO TABS: 500.0000 mg | ORAL_TABLET | Freq: Two times a day (BID) | ORAL | 0 refills | Status: AC

## 2020-09-08 MED ORDER — KETOROLAC TROMETHAMINE 30 MG/ML IJ SOLN
30.0000 mg | Freq: Once | INTRAMUSCULAR | Status: AC
  Administered 2020-09-08: 30 mg via INTRAMUSCULAR
  Filled 2020-09-08: qty 1

## 2020-09-08 MED ORDER — PREDNISONE 20 MG PO TABS: 40.0000 mg | ORAL_TABLET | Freq: Every day | ORAL | 0 refills | Status: AC

## 2020-09-08 NOTE — ED Triage Notes (Signed)
Pt reports right hand pain for the last couple days. Denies recent injury or fever.

## 2020-09-08 NOTE — ED Provider Notes (Signed)
[facility] EMERGENCY DEPARTMENT Provider Note   CSN: [id number] Arrival date & time: 09/08/20  1206     History No chief complaint on file.   Travis Lutz is a 64 y.o. male.  64 y.o male with a PMH of alcohol use disorder presents to the ED with a chief complaint of right hand pain x 2 days ago. Pain is located at the dorsum aspect of the mid hand, exacerbate with movement and palpation. Has been taking medication to help with symptoms without improvement. Does endorses alcohol use consistent of beers every week, no red meat consumption. No prior injury, no fevers, no prior history of gout. No prior history of IV drug use.   The history is provided by the patient.       No past medical history on file.  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   Homeless single person 11/15/2015   Smoker 11/15/2015   Alcohol use disorder 11/15/2015    Past Surgical History:  Procedure Laterality Date   REPAIR OF PERFORATED ULCER         Family History  Family history unknown: Yes    Social History   Tobacco Use   Smoking status: Current Every Day Smoker    Packs/day: 0.25    Types: Cigarettes   Smokeless tobacco: Never Used  Vaping Use   Vaping Use: Never used  Substance Use Topics   Alcohol use: Yes    Alcohol/week: 0.0 standard drinks    Comment: beer daily    Drug use: Yes    Types: Marijuana, Cocaine    Comment: marijuana yesterday, cocaine last week    Home Medications Prior to Admission medications   Medication Sig Start Date End Date Taking? Authorizing Provider  naproxen (NAPROSYN) 500 MG tablet Take 1 tablet (500 mg total) by mouth  2 (two) times daily for 7 days. 09/08/20 09/15/20 Yes Basia Mcginty, Leonie Douglas, PA-C  predniSONE (DELTASONE) 20 MG tablet Take 2 tablets (40 mg total) by mouth daily for 5 days. 09/08/20 09/13/20 Yes Liboria Putnam, Leonie Douglas, PA-C  acetaminophen (TYLENOL) 500 MG tablet Take 1 tablet (500 mg total) by mouth every 6 (six) hours as needed. 11/28/19   Marcine Matar, MD  ciprofloxacin-hydrocortisone (CIPRO HC) OTIC suspension Place 3 drops into the left ear 2 (two) times daily. 04/10/20   Jodell Cipro, PA-C  HYDROcodone-acetaminophen (NORCO/VICODIN) 5-325 MG tablet Take 2 tablets by mouth every 4 (four) hours as needed. 01/10/20   Moshe Cipro, NP  varenicline (CHANTIX) 0.5 MG tablet Take 1 tablet (0.5 mg total) by mouth 2 (two) times daily. 11/28/19   Marcine Matar, MD    Allergies    Patient has no known allergies.  Review of Systems   Review of Systems  Constitutional: Negative for fever.  Respiratory: Negative for shortness of breath.   Cardiovascular: Negative for chest pain.  Gastrointestinal: Negative for nausea and vomiting.  Genitourinary: Negative for flank pain.  Musculoskeletal: Positive for arthralgias. Negative for back pain.  Skin: Negative for pallor and wound.  Neurological: Negative for light-headedness and headaches.  All other systems reviewed and are negative.   Physical Exam Updated Vital Signs BP (!) 159/119 (BP  Location: Right Arm)    Pulse 73    Temp 98.2 F (36.8 C) (Oral)    Resp 19    SpO2 100%   Physical Exam Vitals and nursing note reviewed.  Constitutional:      Appearance: Normal appearance.  HENT:     Head: Normocephalic and atraumatic.     Nose: Nose normal.     Mouth/Throat:     Mouth: Mucous membranes are moist.  Cardiovascular:     Rate and Rhythm: Normal rate.  Pulmonary:     Effort: Pulmonary effort is normal.     Breath sounds: No wheezing or rales.  Abdominal:     General: Abdomen is flat.  Musculoskeletal:        General: Tenderness present.      Right hand: Tenderness present. No swelling, deformity, lacerations or bony tenderness. Decreased range of motion. Normal strength. Normal sensation. There is no disruption of two-point discrimination. Normal capillary refill. Normal pulse.     Cervical back: Normal range of motion and neck supple.     Comments: Pulses present, decreased range of motion due to pain.  Minimal swelling noted to the area, severe tenderness to palpation along the distal aspect of digits.  Skin:    General: Skin is warm and dry.  Neurological:     Mental Status: He is alert and oriented to person, place, and time.     ED Results / Procedures / Treatments   Labs (all labs ordered are listed, but only abnormal results are displayed) Labs Reviewed  CBC - Abnormal; Notable for the following components:      Result Value   RBC 4.16 (*)    MCV 104.3 (*)    MCH 34.6 (*)    All other components within normal limits  BASIC METABOLIC PANEL - Abnormal; Notable for the following components:   Glucose, Bld 104 (*)    All other components within normal limits  URIC ACID    EKG None  Radiology DG Hand Complete Right  Result Date: 09/08/2020 CLINICAL DATA:  Pain EXAM: RIGHT HAND - COMPLETE 3+ VIEW COMPARISON:  None. FINDINGS: Frontal, oblique, and lateral views obtained. No acute fracture or dislocation. Calcification located lateral to the distal most aspect of the fifth proximal phalanx likely represents residua of prior avulsion type injury. There is slight spurring along the dorsal aspect of the fourth DIP joint. Mild narrowing of the first IP joint noted. Other joint spaces appear unremarkable. No appreciable erosive change or periostitis. IMPRESSION: Probable old avulsion injury lateral to the distal most aspect of the fifth proximal phalanx. No acute appearing fracture or dislocation. 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 No erosive change or periostitis. Electronically Signed   By:  Lowella Grip III M.D.   On: 09/08/2020 13:18    Procedures Procedures (including critical care time)  Medications Ordered in ED Medications  ketorolac (TORADOL) 30 MG/ML injection 30 mg (has no administration in time range)    ED Course  I have reviewed the triage vital signs and the nursing notes.  Pertinent labs & imaging results that were available during my care of the patient were reviewed by me and considered in my medical decision making (see chart for details).    MDM Rules/Calculators/A&P    Patient here with a chief complaint of right hand pain which began 2 days ago, no injury, fevers, prior history of IV drug use.  During evaluation he does appear somewhat  swollen, there is pain with palpation of the distal aspect of the digits, no visible wounds, no streaking in the skin, no suspicion for cellulitis.  No previous injury course, x-rays without any acute fracture, there is an old avulsion injury to the lateral aspect, not much tenderness to palpation around the area.  Does endorse severe along with alcohol use, some suspicion for gout although no prior history of this at this time.  Vitals are within normal limits without any signs of fever, no prior history of IV drug use, have a lower suspicion for infection at this time.  Provided with Toradol in the ED to help with pain management.   Xray of the right hand:  Probable old avulsion injury lateral to the distal most aspect of  the fifth proximal phalanx. No acute appearing fracture or  dislocation. Slight osteoarthritic change in the first IP and fourth  DIP joints. No erosive change or periostitis.     These results were discussed with patient at length, he was provided with a shot of Toradol to help with his pain.  Vitals were rechecked, he remains afebrile, have a low suspicion for infection at this time.  We discussed treatment with anti-inflammatories along with steroids to treat for likely gout.  Patient  remains with stable vital signs, stable for discharge.     Portions of this note were generated with [employer]. Dictation errors may occur despite best attempts at proofreading.   Final Clinical Impression(s) / ED Diagnoses Final diagnoses:  Right hand pain    Rx / DC Orders ED Discharge Orders         Ordered    predniSONE (DELTASONE) 20 MG tablet  Daily        09/08/20 1811    naproxen (NAPROSYN) 500 MG tablet  2 times daily        09/08/20 1918           Janeece Fitting, PA-C 09/08/20 [place], [place], DO 09/08/20 2328

## 2020-09-08 NOTE — Discharge Instructions (Addendum)
We dicussed the results of your xrays at length.   You were given medication to help treat your right hand pain, please take this as prescribed.  The second medication is steroids which should help with swelling, please take 2 tablets daily for the next 5 days.  If you experience any fever, worsening symptoms please return to the emergency department.

## 2020-12-06 ENCOUNTER — Emergency Department (HOSPITAL_COMMUNITY): Payer: Self-pay

## 2020-12-06 ENCOUNTER — Other Ambulatory Visit: Payer: Self-pay

## 2020-12-06 ENCOUNTER — Emergency Department (HOSPITAL_COMMUNITY)
Admission: EM | Admit: 2020-12-06 | Discharge: 2020-12-06 | Disposition: A | Discharge status: Home / Self Care | Payer: Self-pay | Attending: Emergency Medicine | Admitting: Emergency Medicine

## 2020-12-06 DIAGNOSIS — M79671 Pain in right foot: Secondary | ICD-10-CM | POA: Insufficient documentation

## 2020-12-06 DIAGNOSIS — F17210 Nicotine dependence, cigarettes, uncomplicated: Secondary | ICD-10-CM | POA: Insufficient documentation

## 2020-12-06 LAB — COMPREHENSIVE METABOLIC PANEL
ALT: 16 U/L (ref 0–44)
AST: 20 U/L (ref 15–41)
Albumin: 3.9 g/dL (ref 3.5–5.0)
Alkaline Phosphatase: 75 U/L (ref 38–126)
Anion gap: 6 (ref 5–15)
BUN: 6 mg/dL — ABNORMAL LOW (ref 8–23)
CO2: 27 mmol/L (ref 22–32)
Calcium: 9.1 mg/dL (ref 8.9–10.3)
Chloride: 107 mmol/L (ref 98–111)
Creatinine, Ser: 0.84 mg/dL (ref 0.61–1.24)
GFR, Estimated: >60 mL/min (ref >60)
Glucose, Bld: 102 mg/dL — ABNORMAL HIGH (ref 70–99)
Potassium: 3.9 mmol/L (ref 3.5–5.1)
Sodium: 140 mmol/L (ref 135–145)
Total Bilirubin: 0.5 mg/dL (ref 0.3–1.2)
Total Protein: 7.5 g/dL (ref 6.5–8.1)

## 2020-12-06 LAB — CBC WITH DIFFERENTIAL/PLATELET
Abs Immature Granulocytes: 0.01 10*3/uL (ref 0.00–0.07)
Basophils Absolute: 0 10*3/uL (ref 0.0–0.1)
Basophils Relative: 1 %
Eosinophils Absolute: 0 10*3/uL (ref 0.0–0.5)
Eosinophils Relative: 1 %
HCT: 42.2 % (ref 39.0–52.0)
Hemoglobin: 14 g/dL (ref 13.0–17.0)
Immature Granulocytes: 0 %
Lymphocytes Relative: 25 %
Lymphs Abs: 1.4 10*3/uL (ref 0.7–4.0)
MCH: 34.5 pg — ABNORMAL HIGH (ref 26.0–34.0)
MCHC: 33.2 g/dL (ref 30.0–36.0)
MCV: 103.9 fL — ABNORMAL HIGH (ref 80.0–100.0)
Monocytes Absolute: 0.7 10*3/uL (ref 0.1–1.0)
Monocytes Relative: 13 %
Neutro Abs: 3.4 10*3/uL (ref 1.7–7.7)
Neutrophils Relative %: 60 %
Platelets: 287 10*3/uL (ref 150–400)
RBC: 4.06 MIL/uL — ABNORMAL LOW (ref 4.22–5.81)
RDW: 14.8 % (ref 11.5–15.5)
WBC: 5.5 10*3/uL (ref 4.0–10.5)
nRBC: 0 % (ref 0.0–0.2)

## 2020-12-06 MED ORDER — INDOMETHACIN 25 MG PO CAPS
50.0000 mg | ORAL_CAPSULE | Freq: Once | ORAL | Status: AC
  Administered 2020-12-06: 50 mg via ORAL
  Filled 2020-12-06: qty 2

## 2020-12-06 MED ORDER — CEPHALEXIN 500 MG PO CAPS: 500.0000 mg | ORAL_CAPSULE | Freq: Three times a day (TID) | ORAL | 0 refills | Status: AC

## 2020-12-06 MED ORDER — COLCHICINE 0.6 MG PO TABS: 0.6000 mg | ORAL_TABLET | Freq: Every day | ORAL | 0 refills | Status: DC

## 2020-12-06 MED ORDER — INDOMETHACIN 50 MG PO CAPS: 50.0000 mg | ORAL_CAPSULE | Freq: Two times a day (BID) | ORAL | 0 refills | Status: AC

## 2020-12-06 NOTE — Discharge Instructions (Addendum)
Call your primary care doctor or specialist as discussed in the next 2-3 days.   Return immediately back to the ER if:  Your symptoms worsen within the next 12-24 hours. You develop new symptoms such as new fevers, persistent vomiting, new pain, shortness of breath, or new weakness or numbness, or if you have any other concerns.  

## 2020-12-06 NOTE — ED Triage Notes (Signed)
C/O redness, swelling and warm to touch on right arm and foot since Saturday. Denies PMH.

## 2020-12-06 NOTE — ED Provider Notes (Signed)
[facility] EMERGENCY DEPARTMENT Provider Note   CSN: [id number] Arrival date & time: 12/06/20  1204     History No chief complaint on file.   Travis Lutz is a 65 y.o. male.  Patient presents the ER for swelling and pain to his right foot.  Describes it as sharp and severe.  Has been persistent for the past 4 days.  He had similar episode of swelling and pain in the right hand several months ago the lasted a few days and then resolved.  Patient states he never really followed up as it resolved.  He noticed swelling and pain similarly now but this time in his right foot.  Describes it as on the top of his midfoot on the right side.  Denies any fall or trauma.  No reports of fevers or chills or vomiting or diarrhea.        No past medical history on file.  Patient Active Problem List   Diagnosis Date Noted  . Dyspnea 02/29/2016  . Chronic low back pain 12/06/2015  . Bilateral chronic knee pain 12/06/2015  . Foot pain, bilateral 12/06/2015  . Vitamin D deficiency 11/16/2015  . Migraine headache 11/15/2015  . Testicular pain 11/15/2015  . 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 Homeless single person 11/15/2015  . Smoker 11/15/2015  . Alcohol use disorder 11/15/2015    Past Surgical History:  Procedure Laterality Date  . REPAIR OF PERFORATED ULCER         Family History  Family history unknown: Yes    Social History   Tobacco Use  . Smoking status: Current Every Day Smoker    Packs/day: 0.25    Types: Cigarettes  . Smokeless tobacco: Never Used  Vaping Use  . Vaping Use: Never used  Substance Use Topics  . Alcohol use: Yes    Alcohol/week: 0.0 standard drinks    Comment: beer daily   . Drug use: Yes    Types: Marijuana, Cocaine    Comment: marijuana yesterday, cocaine last week    Home Medications Prior to Admission medications   Medication Sig Start Date End Date Taking? Authorizing Provider  cephALEXin (KEFLEX)  500 MG capsule Take 1 capsule (500 mg total) by mouth 3 (three) times daily for 7 days. 12/06/20 12/13/20 Yes Luna Fuse, MD  colchicine 0.6 MG tablet Take 1 tablet (0.6 mg total) by mouth daily for 5 days. 12/06/20 12/11/20 Yes Luna Fuse, MD  indomethacin (INDOCIN) 50 MG capsule Take 1 capsule (50 mg total) by mouth 2 (two) times daily with a meal for 5 days. 12/06/20 12/11/20 Yes Luna Fuse, MD  acetaminophen (TYLENOL) 500 MG tablet Take 1 tablet (500 mg total) by mouth every 6 (six) hours as needed. 11/28/19   Ladell Pier, MD  ciprofloxacin-hydrocortisone (CIPRO HC) OTIC suspension Place 3 drops into the left ear 2 (two) times daily. 04/10/20   Konrad Felix, PA-C  HYDROcodone-acetaminophen (NORCO/VICODIN) 5-325 MG tablet Take 2 tablets by mouth every 4 (four) hours as needed. 01/10/20   Faustino Congress, NP  varenicline (CHANTIX) 0.5 MG tablet Take 1 tablet (0.5 mg total) by mouth 2 (two) times daily. 11/28/19   Ladell Pier, MD    Allergies    Patient has no known allergies.  Review of Systems   Review of Systems  Constitutional: Negative for fever.  HENT: Negative for ear pain and sore throat.   Eyes: Negative for pain.  Respiratory: Negative  for cough.   Cardiovascular: Negative for chest pain.  Gastrointestinal: Negative for abdominal pain.  Genitourinary: Negative for flank pain.  Musculoskeletal: Negative for back pain.  Skin: Positive for rash. Negative for color change.  Neurological: Negative for syncope.  All other systems reviewed and are negative.   Physical Exam Updated Vital Signs BP (!) 169/93   Pulse 75   Temp 98.8 F (37.1 C) (Oral)   Resp (!) 22   SpO2 100%   Physical Exam Constitutional:      General: He is not in acute distress.    Appearance: He is well-developed.  HENT:     Head: Normocephalic.     Nose: Nose normal.  Eyes:     Extraocular Movements: Extraocular movements intact.  Cardiovascular:     Rate and Rhythm: Normal  rate.  Pulmonary:     Effort: Pulmonary effort is normal.  Musculoskeletal:     Comments: Right lower foot is tender to palpation diffusely across the toe the midfoot well as well for most of his toes.  Erythema is seen here and increased warmth is seen here on palpation.  No pain or tenderness noted to his right upper extremity.  There is some mild swelling there in the MCP joints diffusely across the hand.  Patient states that he had redness and pain in the right hand several months ago but that has resolved and has not returned.  Skin:    Coloration: Skin is not jaundiced.  Neurological:     Mental Status: He is alert. Mental status is at baseline.     ED Results / Procedures / Treatments   Labs (all labs ordered are listed, but only abnormal results are displayed) Labs Reviewed  CBC WITH DIFFERENTIAL/PLATELET - Abnormal; Notable for the following components:      Result Value   RBC 4.06 (*)    MCV 103.9 (*)    MCH 34.5 (*)    All other components within normal limits  COMPREHENSIVE METABOLIC PANEL - Abnormal; Notable for the following components:   Glucose, Bld 102 (*)    BUN 6 (*)    All other components within normal limits    EKG EKG Interpretation  Date/Time:  Monday December 06 2020 19:48:08 EDT Ventricular Rate:  68 PR Interval:    QRS Duration: 86 QT Interval:  418 QTC Calculation: 445 R Axis:   82 Text Interpretation: Sinus rhythm LAE, consider biatrial enlargement Borderline right axis deviation Probable left ventricular hypertrophy Anterior Q waves, possibly due to LVH Confirmed by Thamas Jaegers ([id number]) on 12/06/2020 8:01:37 PM   Radiology DG Hand 2 View Right  Result Date: 12/06/2020 CLINICAL DATA:  Redness and swelling.  Warm to touch. EXAM: RIGHT HAND - 2 VIEW COMPARISON:  09/08/2020 FINDINGS: There is diffuse soft tissue swelling. No signs of acute fracture or dislocation. No radio-opaque foreign bodies are soft tissue calcifications. No signs of inflammatory  or infectious arthropathy. IMPRESSION: 1. Soft tissue swelling. 2. No acute bone abnormality. Electronically Signed   By: Kerby Moors M.D.   On: 12/06/2020 13:36   DG Foot Complete Right  Result Date: 12/06/2020 CLINICAL DATA:  Redness and swelling with erythema.  No injury. EXAM: RIGHT FOOT COMPLETE - 3+ VIEW COMPARISON:  None. FINDINGS: No acute osseous abnormality. Mild degenerative changes in the first metatarsophalangeal joint. Dorsal soft tissue swelling. IMPRESSION: 1. Dorsal soft tissue swelling.  No underlying osseous abnormality. 2. Mild first metatarsophalangeal joint osteoarthritis. Electronically Signed   By: Rip Harbour  Blietz M.D.   On: 12/06/2020 13:36    Procedures Procedures   Medications Ordered in ED Medications  indomethacin (INDOCIN) capsule 50 mg (50 mg Oral Given 12/06/20 2039)    ED Course  I have reviewed the triage vital signs and the nursing notes.  Pertinent labs & imaging results that were available during my care of the patient were reviewed by me and considered in my medical decision making (see chart for details).    MDM Rules/Calculators/A&P                          Symptoms have been ongoing for several days without improvement.  Patient denies fevers at home.  I suspect gout more likely, less likely cellulitis.  Does not appear to affect his ankle joint.  Patient still able to weight-bear with moderate amount of pain.  Given indomethacin here in the ER, will be discharged home with a prescription of antibiotics and medication for possible gout.  Denies any IV drug use.  Denies any history of diabetes.  Will recommend follow-up with his doctor or podiatrist within the week.  Advised immediate return for worsening pain fevers increased redness or any additional concerns.  Final Clinical Impression(s) / ED Diagnoses Final diagnoses:  Right foot pain    Rx / DC Orders ED Discharge Orders         Ordered    colchicine 0.6 MG tablet  Daily         12/06/20 2115    indomethacin (INDOCIN) 50 MG capsule  2 times daily with meals        12/06/20 2115    cephALEXin (KEFLEX) 500 MG capsule  3 times daily        12/06/20 2115           Luna Fuse, MD 12/06/20 2115

## 2020-12-20 ENCOUNTER — Telehealth: Payer: Self-pay | Admitting: Internal Medicine

## 2020-12-20 NOTE — Telephone Encounter (Signed)
I call the Pt to inform that the office will be close on 12/24/20 for the holiday, he need to call back to reschedule, unable to LV<

## 2020-12-23 ENCOUNTER — Telehealth: Payer: Self-pay | Admitting: Internal Medicine

## 2020-12-23 NOTE — Telephone Encounter (Signed)
Pt is calling to have his [facility] appt rescheduled Cb- [phone]

## 2020-12-24 ENCOUNTER — Ambulatory Visit: Payer: Self-pay

## 2020-12-27 NOTE — Telephone Encounter (Signed)
Pt has been scheduled for 4/25 at 10am

## 2021-01-03 ENCOUNTER — Other Ambulatory Visit: Payer: Self-pay

## 2021-01-03 ENCOUNTER — Ambulatory Visit: Payer: Self-pay | Attending: Internal Medicine

## 2021-01-19 NOTE — Progress Notes (Deleted)
Patient ID: Travis Lutz, male   DOB: 03-19-1956, 65 y.o.   MRN: [id number]   After ED visit   Symptoms have been ongoing for several days without improvement.  Patient denies fevers at home.  I suspect gout more likely, less likely cellulitis.  Does not appear to affect his ankle joint.  Patient still able to weight-bear with moderate amount of pain.  Given indomethacin here in the ER, will be discharged home with a prescription of antibiotics and medication for possible gout.  Denies any IV drug use.  Denies any history of diabetes.  Will recommend follow-up with his doctor or podiatrist within the week.  Advised immediate return for worsening pain fevers increased redness or any additional concerns.

## 2021-01-20 ENCOUNTER — Inpatient Hospital Stay: Payer: Self-pay | Admitting: Physician Assistant

## 2021-01-20 ENCOUNTER — Ambulatory Visit: Payer: Self-pay

## 2021-03-02 ENCOUNTER — Other Ambulatory Visit: Payer: Self-pay

## 2021-03-02 ENCOUNTER — Encounter: Payer: Self-pay | Admitting: Physician Assistant

## 2021-03-02 ENCOUNTER — Ambulatory Visit: Payer: Self-pay | Attending: Physician Assistant | Admitting: Physician Assistant

## 2021-03-02 DIAGNOSIS — S300XXA Contusion of lower back and pelvis, initial encounter: Secondary | ICD-10-CM

## 2021-03-02 MED ORDER — METHOCARBAMOL 500 MG PO TABS
500.0000 mg | ORAL_TABLET | Freq: Four times a day (QID) | ORAL | 0 refills | Status: DC
  Filled 2021-03-02: qty 60, 15d supply, fill #0

## 2021-03-02 MED ORDER — TRAMADOL HCL 50 MG PO TABS
50.0000 mg | ORAL_TABLET | Freq: Three times a day (TID) | ORAL | 0 refills | Status: AC | PRN
  Filled 2021-03-02: qty 15, 5d supply, fill #0

## 2021-03-02 NOTE — Progress Notes (Signed)
Patient ID: Travis Lutz, male   DOB: 06/20/1956, 65 y.o.   MRN: [id number] Virtual Visit via Telephone Note  I connected with Tyrone Apple on 03/02/21 at  9:10 AM EDT by telephone and verified that I am speaking with the correct person using two identifiers.  Location: Patient: home Provider: [facility] office   I discussed the limitations, risks, security and privacy concerns of performing an evaluation and management service by telephone and the availability of in person appointments. I also discussed with the patient that there may be a patient responsible charge related to this service. The patient expressed understanding and agreed to proceed.   History of Present Illness: fell onto a tree stump about 2 weeks ago. No radicular s/sx.  Tylenol and advil helps some.  Bowels and bladder moving normally.  Ambulates normally.  Denies weakness. He doesn't have a ride or any money but says he will try.  Wants me to have someone reach out to him for financial assistance.      Observations/Objective:  NAD.  A&Ox3   Assessment and Plan: 1. Contusion of lower back, initial encounter Continue to use advil and tylenol as needed.  #15 tramadol sent.  Methocarbamol 500mg  sent.   - DG Lumbar Spine Complete; Future    Follow Up Instructions: See PCP in 2-3 months for chronic conditions.     I discussed the assessment and treatment plan with the patient. The patient was provided an opportunity to ask questions and all were answered. The patient agreed with the plan and demonstrated an understanding of the instructions.   The patient was advised to call back or seek an in-person evaluation if the symptoms worsen or if the condition fails to improve as anticipated.  I provided 14 minutes of non-face-to-face time during this encounter.   Freeman Caldron, PA-C

## 2021-03-03 ENCOUNTER — Other Ambulatory Visit: Payer: Self-pay

## 2021-03-04 ENCOUNTER — Ambulatory Visit (HOSPITAL_COMMUNITY)
Admission: RE | Admit: 2021-03-04 | Discharge: 2021-03-04 | Disposition: A | Discharge status: Home / Self Care | Payer: Self-pay | Source: Ambulatory Visit | Attending: Physician Assistant | Admitting: Physician Assistant

## 2021-03-04 ENCOUNTER — Other Ambulatory Visit: Payer: Self-pay

## 2021-03-04 DIAGNOSIS — S300XXA Contusion of lower back and pelvis, initial encounter: Secondary | ICD-10-CM

## 2021-03-31 ENCOUNTER — Other Ambulatory Visit: Payer: Self-pay | Admitting: Physician Assistant

## 2021-03-31 DIAGNOSIS — M47816 Spondylosis without myelopathy or radiculopathy, lumbar region: Secondary | ICD-10-CM

## 2021-04-10 ENCOUNTER — Emergency Department (HOSPITAL_COMMUNITY)
Admission: EM | Admit: 2021-04-10 | Discharge: 2021-04-10 | Disposition: A | Discharge status: Home / Self Care | Payer: Self-pay | Attending: Emergency Medicine | Admitting: Emergency Medicine

## 2021-04-10 ENCOUNTER — Encounter (HOSPITAL_COMMUNITY): Payer: Self-pay | Admitting: Emergency Medicine

## 2021-04-10 ENCOUNTER — Other Ambulatory Visit: Payer: Self-pay

## 2021-04-10 DIAGNOSIS — L0291 Cutaneous abscess, unspecified: Secondary | ICD-10-CM

## 2021-04-10 DIAGNOSIS — K611 Rectal abscess: Secondary | ICD-10-CM | POA: Insufficient documentation

## 2021-04-10 DIAGNOSIS — Z5321 Procedure and treatment not carried out due to patient leaving prior to being seen by health care provider: Secondary | ICD-10-CM | POA: Insufficient documentation

## 2021-04-10 LAB — CBC WITH DIFFERENTIAL/PLATELET
Abs Immature Granulocytes: 0.01 10*3/uL (ref 0.00–0.07)
Basophils Absolute: 0 10*3/uL (ref 0.0–0.1)
Basophils Relative: 1 %
Eosinophils Absolute: 0.1 10*3/uL (ref 0.0–0.5)
Eosinophils Relative: 1 %
HCT: 37.3 % — ABNORMAL LOW (ref 39.0–52.0)
Hemoglobin: 12.8 g/dL — ABNORMAL LOW (ref 13.0–17.0)
Immature Granulocytes: 0 %
Lymphocytes Relative: 24 %
Lymphs Abs: 1.3 10*3/uL (ref 0.7–4.0)
MCH: 35.7 pg — ABNORMAL HIGH (ref 26.0–34.0)
MCHC: 34.3 g/dL (ref 30.0–36.0)
MCV: 103.9 fL — ABNORMAL HIGH (ref 80.0–100.0)
Monocytes Absolute: 0.7 10*3/uL (ref 0.1–1.0)
Monocytes Relative: 13 %
Neutro Abs: 3.1 10*3/uL (ref 1.7–7.7)
Neutrophils Relative %: 61 %
Platelets: 291 10*3/uL (ref 150–400)
RBC: 3.59 MIL/uL — ABNORMAL LOW (ref 4.22–5.81)
RDW: 14.1 % (ref 11.5–15.5)
WBC: 5.2 10*3/uL (ref 4.0–10.5)
nRBC: 0 % (ref 0.0–0.2)

## 2021-04-10 LAB — BASIC METABOLIC PANEL
Anion gap: 7 (ref 5–15)
BUN: 12 mg/dL (ref 8–23)
CO2: 24 mmol/L (ref 22–32)
Calcium: 9.2 mg/dL (ref 8.9–10.3)
Chloride: 105 mmol/L (ref 98–111)
Creatinine, Ser: 0.84 mg/dL (ref 0.61–1.24)
GFR, Estimated: >60 mL/min (ref >60)
Glucose, Bld: 99 mg/dL (ref 70–99)
Potassium: 4.3 mmol/L (ref 3.5–5.1)
Sodium: 136 mmol/L (ref 135–145)

## 2021-04-10 NOTE — ED Notes (Signed)
Pt called x3 for vitals with no response. Moving OTF.

## 2021-04-10 NOTE — ED Triage Notes (Signed)
Pt reports rectal abscess x 3 days.  Also reports bee sting to R forearm 4 days ago.

## 2021-04-10 NOTE — ED Provider Notes (Signed)
Emergency Medicine Provider Triage Evaluation Note  Travis Lutz , a 64 y.o. male  was evaluated in triage.  Pt complains of abscess to perirectal region. Began 4 days ago. Feels pain at rectum. "Like there is a ball." Rates pain a 9/10. No prior abscess. No drainage. No pain with BM, urination, No swelling to scrotum  Review of Systems  Positive: Rectal pain Negative: Blood in stool, pain with BM  Physical Exam  BP (!) 130/95 (BP Location: Right Arm)   Pulse 71   Temp 98.5 F (36.9 C)   Resp 17   SpO2 97%  Gen:   Awake, no distress   Resp:  Normal effort  MSK:   Moves extremities without difficulty  ABD:  Soft non tender Other:    Medical Decision Making  Medically screening exam initiated at 10:02 AM.  Appropriate orders placed.  Aodhan A Gum was informed that the remainder of the evaluation will be completed by another provider, this initial triage assessment does not replace that evaluation, and the importance of remaining in the ED until their evaluation is complete.  Rectal pain   Merly Hinkson A, PA-C 04/10/21 1006    Pattricia Boss, MD 04/10/21 1556

## 2021-04-11 ENCOUNTER — Emergency Department (HOSPITAL_COMMUNITY)
Admission: EM | Admit: 2021-04-11 | Discharge: 2021-04-11 | Disposition: A | Discharge status: Home / Self Care | Payer: Self-pay | Attending: Emergency Medicine | Admitting: Emergency Medicine

## 2021-04-11 ENCOUNTER — Encounter (HOSPITAL_COMMUNITY): Payer: Self-pay

## 2021-04-11 ENCOUNTER — Ambulatory Visit (HOSPITAL_COMMUNITY)
Admission: EM | Admit: 2021-04-11 | Discharge: 2021-04-11 | Disposition: A | Discharge status: Home / Self Care | Payer: Self-pay | Attending: Internal Medicine | Admitting: Internal Medicine

## 2021-04-11 ENCOUNTER — Other Ambulatory Visit: Payer: Self-pay

## 2021-04-11 ENCOUNTER — Encounter (HOSPITAL_COMMUNITY): Payer: Self-pay | Admitting: *Deleted

## 2021-04-11 ENCOUNTER — Emergency Department (HOSPITAL_COMMUNITY): Payer: Self-pay

## 2021-04-11 DIAGNOSIS — K640 First degree hemorrhoids: Secondary | ICD-10-CM | POA: Insufficient documentation

## 2021-04-11 DIAGNOSIS — K610 Anal abscess: Secondary | ICD-10-CM

## 2021-04-11 DIAGNOSIS — F17210 Nicotine dependence, cigarettes, uncomplicated: Secondary | ICD-10-CM | POA: Insufficient documentation

## 2021-04-11 LAB — CBC WITH DIFFERENTIAL/PLATELET
Abs Immature Granulocytes: 0.01 10*3/uL (ref 0.00–0.07)
Basophils Absolute: 0 10*3/uL (ref 0.0–0.1)
Basophils Relative: 1 %
Eosinophils Absolute: 0.1 10*3/uL (ref 0.0–0.5)
Eosinophils Relative: 1 %
HCT: 42.9 % (ref 39.0–52.0)
Hemoglobin: 14.4 g/dL (ref 13.0–17.0)
Immature Granulocytes: 0 %
Lymphocytes Relative: 20 %
Lymphs Abs: 1.2 10*3/uL (ref 0.7–4.0)
MCH: 35 pg — ABNORMAL HIGH (ref 26.0–34.0)
MCHC: 33.6 g/dL (ref 30.0–36.0)
MCV: 104.1 fL — ABNORMAL HIGH (ref 80.0–100.0)
Monocytes Absolute: 0.7 10*3/uL (ref 0.1–1.0)
Monocytes Relative: 11 %
Neutro Abs: 4.2 10*3/uL (ref 1.7–7.7)
Neutrophils Relative %: 67 %
Platelets: 319 10*3/uL (ref 150–400)
RBC: 4.12 MIL/uL — ABNORMAL LOW (ref 4.22–5.81)
RDW: 14.1 % (ref 11.5–15.5)
WBC: 6.2 10*3/uL (ref 4.0–10.5)
nRBC: 0 % (ref 0.0–0.2)

## 2021-04-11 MED ORDER — DOXYCYCLINE HYCLATE 100 MG PO CAPS
100.0000 mg | ORAL_CAPSULE | Freq: Two times a day (BID) | ORAL | 0 refills | Status: DC
  Filled 2021-04-11: qty 14, 7d supply, fill #0

## 2021-04-11 MED ORDER — DOCUSATE SODIUM 100 MG PO CAPS
100.0000 mg | ORAL_CAPSULE | Freq: Once | ORAL | Status: AC
  Administered 2021-04-11: 100 mg via ORAL
  Filled 2021-04-11: qty 1

## 2021-04-11 MED ORDER — HYDROCORT-PRAMOXINE (PERIANAL) 1-1 % EX FOAM
1.0000 | Freq: Two times a day (BID) | CUTANEOUS | Status: DC
  Administered 2021-04-11: 1 via RECTAL
  Filled 2021-04-11: qty 10

## 2021-04-11 MED ORDER — DOXYCYCLINE HYCLATE 100 MG PO TABS
100.0000 mg | ORAL_TABLET | Freq: Once | ORAL | Status: AC
  Administered 2021-04-11: 100 mg via ORAL
  Filled 2021-04-11: qty 1

## 2021-04-11 MED ORDER — IBUPROFEN 400 MG PO TABS
600.0000 mg | ORAL_TABLET | Freq: Once | ORAL | Status: AC
  Administered 2021-04-11: 600 mg via ORAL
  Filled 2021-04-11: qty 1

## 2021-04-11 MED ORDER — IOHEXOL 300 MG/ML  SOLN
100.0000 mL | Freq: Once | INTRAMUSCULAR | Status: AC | PRN
  Administered 2021-04-11: 100 mL via INTRAVENOUS

## 2021-04-11 MED ORDER — POLYETHYLENE GLYCOL 3350 17 GM/SCOOP PO POWD
1.0000 | Freq: Once | ORAL | Status: AC
  Administered 2021-04-11: 255 g via ORAL
  Filled 2021-04-11: qty 255

## 2021-04-11 MED ORDER — PHENYLEPHRINE-MINERAL OIL-PET 0.25-14-74.9 % RE OINT
1.0000 "application " | TOPICAL_OINTMENT | Freq: Once | RECTAL | Status: DC
  Filled 2021-04-11: qty 57

## 2021-04-11 NOTE — ED Triage Notes (Signed)
Pt reports bug bite to right forearm about 4 days ago and a rectal abscess for the past 3 days. Pain with bowel movements

## 2021-04-11 NOTE — ED Triage Notes (Signed)
Pt reports bee sting to groin on SAt. Pt was also seen in ED yesterday for rectal abscess.

## 2021-04-11 NOTE — ED Provider Notes (Signed)
[facility] EMERGENCY DEPARTMENT Provider Note   CSN: [id number] Arrival date & time: 04/11/21  1218     History Chief Complaint  Patient presents with   Insect Bite   Rectal Pain    Travis Lutz is a 65 y.o. male.  65 yo M with acute complaints pain at his rectum.  The patient checked into the ED a couple days ago to be seen but left prior to seeing a provider he then tried to go to urgent care couple times and was told he had come to the ED because he had a correctable abscess.  Unknown if they actually looked at his rectum or not.  He denies any fevers or chills.  States he was stung by a bee before this happened on a different part of his body.  The history is provided by the patient.  Illness Severity:  Moderate Onset quality:  Sudden Duration:  3 days Timing:  Constant Progression:  Worsening Chronicity:  New Associated symptoms: no abdominal pain, no chest pain, no congestion, no diarrhea, no fever, no headaches, no myalgias, no rash, no shortness of breath and no vomiting       History reviewed. No pertinent past medical history.  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   Homeless single person 11/15/2015   Smoker 11/15/2015   Alcohol use disorder 11/15/2015    Past Surgical History:  Procedure Laterality Date   REPAIR OF PERFORATED ULCER         Family History  Family history unknown: Yes    Social History   Tobacco Use   Smoking status: Every Day    Packs/day: 0.25    Types: Cigarettes   Smokeless tobacco: Never  Vaping Use   Vaping Use: Never used  Substance Use Topics   Alcohol use: Yes    Alcohol/week: 0.0 standard drinks     Comment: beer daily    Drug use: Yes    Types: Marijuana, Cocaine    Comment: marijuana yesterday, cocaine last week    Home Medications Prior to Admission medications   Medication Sig Start Date End Date Taking? Authorizing Provider  doxycycline (VIBRAMYCIN) 100 MG capsule Take 1 capsule (100 mg total) by mouth 2 (two) times daily for 7 days 04/11/21  Yes Deno Etienne, DO  acetaminophen (TYLENOL) 500 MG tablet Take 1 tablet (500 mg total) by mouth every 6 (six) hours as needed. 11/28/19   Ladell Pier, MD    Allergies    Patient has no known allergies.  Review of Systems   Review of Systems  Constitutional:  Negative for chills and fever.  HENT:  Negative for congestion and facial swelling.   Eyes:  Negative for discharge and visual disturbance.  Respiratory:  Negative for shortness of breath.   Cardiovascular:  Negative for chest pain and palpitations.  Gastrointestinal:  Positive for constipation. Negative for abdominal pain, diarrhea and vomiting.  Musculoskeletal:  Negative for arthralgias and myalgias.  Skin:  Negative for color change and rash.  Neurological:  Negative for tremors, syncope and headaches.  Psychiatric/Behavioral:  Negative for confusion and dysphoric mood.    Physical Exam Updated Vital Signs BP Marland Kitchen)  197/96   Pulse 90   Temp 98.6 F (37 C) (Oral)   Resp 17   Ht '6\' 1"'$  (1.854 m)   Wt 59 kg   SpO2 100%   BMI 17.15 kg/m   Physical Exam Vitals and nursing note reviewed.  Constitutional:      Appearance: He is well-developed.  HENT:     Head: Normocephalic and atraumatic.  Eyes:     Pupils: Pupils are equal, round, and reactive to light.  Neck:     Vascular: No JVD.  Cardiovascular:     Rate and Rhythm: Normal rate and regular rhythm.     Heart sounds: No murmur heard.   No friction rub. No gallop.  Pulmonary:     Effort: No respiratory distress.     Breath sounds: No wheezing.  Abdominal:     General: There is no distension.      Tenderness: There is no abdominal tenderness. There is no guarding or rebound.  Genitourinary:      Comments: Skin colored area of inflammation at the posterior aspect of the rectum.  Not significantly tender.  No change in skin coloration no erythema or warmth.  Fluctuant. Musculoskeletal:        General: Normal range of motion.     Cervical back: Normal range of motion and neck supple.  Skin:    Coloration: Skin is not pale.     Findings: No rash.  Neurological:     Mental Status: He is alert and oriented to person, place, and time.  Psychiatric:        Behavior: Behavior normal.    ED Results / Procedures / Treatments   Labs (all labs ordered are listed, but only abnormal results are displayed) Labs Reviewed  CBC WITH DIFFERENTIAL/PLATELET - Abnormal; Notable for the following components:      Result Value   RBC 4.12 (*)    MCV 104.1 (*)    MCH 35.0 (*)    All other components within normal limits  URINALYSIS, ROUTINE W REFLEX MICROSCOPIC    EKG None  Radiology CT Abdomen Pelvis W Contrast  Result Date: 04/11/2021 CLINICAL DATA:  Perirectal abscess EXAM: CT ABDOMEN AND PELVIS WITH CONTRAST TECHNIQUE: Multidetector CT imaging of the abdomen and pelvis was performed using the standard protocol following bolus administration of intravenous contrast. CONTRAST:  158m OMNIPAQUE IOHEXOL 300 MG/ML  SOLN COMPARISON:  11/09/2019 FINDINGS: Lower chest: No acute pleural or parenchymal lung disease. Hepatobiliary: Multiple small hepatic cysts are again identified. Otherwise the liver is unremarkable. Gallbladder is normal. No intrahepatic duct dilation. Pancreas: Unremarkable. No pancreatic ductal dilatation or surrounding inflammatory changes. Spleen: Normal in size without focal abnormality. Adrenals/Urinary Tract: The kidneys enhance normally and symmetrically. No urinary tract calculi or obstructive uropathy. Stable nonspecific thickening of the adrenal glands. Bladder is unremarkable.  Stomach/Bowel: Evaluation of bowel is somewhat limited due to the lack of intraperitoneal fat and lack of oral contrast. No evidence of bowel obstruction or ileus. No bowel wall thickening or inflammatory change. The appendix, if still present, is not well visualized. The distal colon and rectum appear unremarkable. There is a small fluid collection measuring 0.9 x 1.3 x 1.2 cm along the right posterolateral aspect of the anal verge, consistent with small abscess. Vascular/Lymphatic: Aortic atherosclerosis. No enlarged abdominal or pelvic lymph nodes. Reproductive: Prostate is enlarged but stable. Other: No free fluid or free intraperitoneal gas. No abdominal wall hernia. Musculoskeletal: No acute or destructive bony lesions. IMPRESSION: 1. 1.3 cm  subcutaneous fluid collection along the right posterolateral aspect at the anal verge, consistent with small abscess. 2. Otherwise no acute intra-abdominal or intrapelvic process. 3.  Aortic Atherosclerosis (ICD10-I70.0). Electronically Signed   By: Randa Ngo M.D.   On: 04/11/2021 16:53    Procedures Procedures   Medications Ordered in ED Medications  phenylephrine-shark liver oil-mineral oil-petrolatum (PREPARATION H) rectal ointment 1 application (has no administration in time range)  polyethylene glycol powder (GLYCOLAX/MIRALAX) container 255 g (has no administration in time range)  docusate sodium (COLACE) capsule 100 mg (has no administration in time range)  doxycycline (VIBRA-TABS) tablet 100 mg (has no administration in time range)  ibuprofen (ADVIL) tablet 600 mg (600 mg Oral Given 04/11/21 1413)  iohexol (OMNIPAQUE) 300 MG/ML solution 100 mL (100 mLs Intravenous Contrast Given 04/11/21 1615)    ED Course  I have reviewed the triage vital signs and the nursing notes.  Pertinent labs & imaging results that were available during my care of the patient were reviewed by me and considered in my medical decision making (see chart for details).     MDM Rules/Calculators/A&P                           65 yo M with a chief complaints of pain at his rectum when trying to use the bathroom.  This is been going on for about 3 or 4 days.  On exam the patient clinically has a hemorrhoid.  For some reason he was sent from urgent care without an exam.  CT scan performed here was read as a possible fluid collection there at the I think this is still consistent with a hemorrhoid.  I will give him a week of antibiotics just in case this seems significantly less likely without fevers or erythema or warmth to the area.  We will give him referral to general surgery.  Patient unfortunately does not have the means to get any prescriptions filled for at least a week.  We will try to give him a tube of Preparation H and MiraLAX.  Suggested that he get Colace.  When I was discussing that he should not spend a prolonged time on the toilet he tells me that he has been playing on his phone while sitting there and has been bearing down quite heavily.  5:12 PM:  I have discussed the diagnosis/risks/treatment options with the patient and believe the pt to be eligible for discharge home to follow-up with PCP. We also discussed returning to the ED immediately if new or worsening sx occur. We discussed the sx which are most concerning (e.g., sudden worsening pain, fever, inability to tolerate by mouth) that necessitate immediate return. Medications administered to the patient during their visit and any new prescriptions provided to the patient are listed below.  Medications given during this visit Medications  phenylephrine-shark liver oil-mineral oil-petrolatum (PREPARATION H) rectal ointment 1 application (has no administration in time range)  polyethylene glycol powder (GLYCOLAX/MIRALAX) container 255 g (has no administration in time range)  docusate sodium (COLACE) capsule 100 mg (has no administration in time range)  doxycycline (VIBRA-TABS) tablet 100 mg (has no  administration in time range)  ibuprofen (ADVIL) tablet 600 mg (600 mg Oral Given 04/11/21 1413)  iohexol (OMNIPAQUE) 300 MG/ML solution 100 mL (100 mLs Intravenous Contrast Given 04/11/21 1615)     The patient appears reasonably screen and/or stabilized for discharge and I doubt any other medical condition or other [facility] requiring  further screening, evaluation, or treatment in the ED at this time prior to discharge.   Final Clinical Impression(s) / ED Diagnoses Final diagnoses:  Grade I hemorrhoids    Rx / DC Orders ED Discharge Orders          Ordered    doxycycline (VIBRAMYCIN) 100 MG capsule  2 times daily        04/11/21 [place], Dixon, DO 04/11/21 1712

## 2021-04-11 NOTE — Discharge Instructions (Addendum)
The radiologist read your CT scan that it could still be an abscess though on exam I think clinically it is more likely to be a hemorrhoid.  Please try to keep your stools soft by taking a stool softener and/or laxative as needed.  Try not to spend any more time on the toilet then you need to.  Do not read while you are on the toilet.  Take the antibiotics as prescribed in case this is an infection.  Use the ointments after bowel movements and before you go to bed.  Please follow-up with the general surgeons in the office as they are the experts in both.  Please return if you develop a fever or rapid spreading swelling.

## 2021-04-11 NOTE — ED Provider Notes (Signed)
Emergency Medicine Provider Triage Evaluation Note  Travis Lutz , a 65 y.o. male  was evaluated in triage.  Pt complains of perirectal abscess per urgent care.  Pain began 4 days ago.  Pain increases with bowel movements, loose stools last 24 hours.  Feels he is not completely emptying his bladder.  Not relieved by sitz bath's  Review of Systems  Positive: Diarrhea, perirectal pain, swelling, "head like a pimple" nausea Negative: Scrotal pain or swelling, penile pain.  Dysuria, urinary frequency urgency, fevers, chills  Physical Exam  BP (!) 149/93 (BP Location: Right Arm)   Pulse 66   Temp 98.6 F (37 C) (Oral)   Resp 14   Ht '6\' 1"'$  (1.854 m)   Wt 59 kg   SpO2 100%   BMI 17.15 kg/m  Gen:   Awake, no distress   Resp:  Normal effort  MSK:   Moves extremities without difficulty  Other:  RRR no M/R/G.  Lungs CTA B.  Abdomen soft, nondistended, nontender.  Medical Decision Making  Medically screening exam initiated at 1:39 PM.  Appropriate orders placed.  Mostyn A Carapia was informed that the remainder of the evaluation will be completed by another provider, this initial triage assessment does not replace that evaluation, and the importance of remaining in the ED until their evaluation is complete.  This chart was dictated using voice recognition software, Dragon. Despite the best efforts of this provider to proofread and correct errors, errors may still occur which can change documentation meaning.    Aura Dials 04/11/21 1340    Regan Lemming, MD 04/11/21 2026

## 2021-04-11 NOTE — ED Notes (Signed)
Pt needs to return to ED . Pt left with out being seen yesterday.

## 2021-04-11 NOTE — Discharge Instructions (Addendum)
Patient is advised to go to the emergency department for further evaluation

## 2021-04-12 ENCOUNTER — Other Ambulatory Visit: Payer: Self-pay

## 2021-04-12 NOTE — ED Provider Notes (Signed)
[name]    CSN: [id number] Arrival date & time: 04/11/21  1039      History   Chief Complaint Chief Complaint  Patient presents with   Insect Bite    groin    HPI Travis Lutz is a 64 y.o. male comes to the urgent care with perianal pain which started couple days ago.  Patient noticed the pain and swelling in the ago.  Pain is severe, constant and throbbing.  No known relieving factors.  No fever or chills.  Patient denies any history of hemorrhoids.  Pain is aggravated by having bowel movements.  He denies constipation.  He has observed some purulent discharge from the tender swelling area in the perianal region.  No nausea or vomiting.  No abdominal pain or distention.Marland Kitchen   HPI  History reviewed. No pertinent past medical history.  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 Authorizing Provider  acetaminophen (TYLENOL) 500 MG tablet Take 1 tablet (500 mg total) by mouth every 6 (six) hours as needed. 11/28/19  Yes Ladell Pier, MD  doxycycline (VIBRAMYCIN) 100 MG capsule Take 1 capsule (100 mg total) by mouth 2 (two) times daily for 7 days 04/11/21   Deno Etienne, DO    Family History Family History  Family history unknown: Yes    Social History Social History   Tobacco Use   Smoking status: Every Day    Packs/day: 0.25    Types: Cigarettes   Smokeless tobacco: Never  Vaping Use    Vaping Use: Never used  Substance Use Topics   Alcohol use: Yes    Alcohol/week: 0.0 standard drinks    Comment: beer daily    Drug use: Yes    Types: Marijuana, Cocaine    Comment: marijuana yesterday, cocaine last week     Allergies   Patient has no known allergies.   Review of Systems Review of Systems  Gastrointestinal:  Positive for rectal pain. Negative for anal bleeding, diarrhea, nausea and vomiting.  Genitourinary: Negative.   Musculoskeletal:  Positive for arthralgias.    Physical Exam Triage Vital Signs ED Triage Vitals  Enc Vitals Group     BP 04/11/21 1133 131/86     Pulse Rate 04/11/21 1133 63     Resp 04/11/21 1133 20     Temp 04/11/21 1133 98.9 F (37.2 C)     Temp src --      SpO2 04/11/21 1133 100 %     Weight --      Height --      Head Circumference --      Peak Flow --      Pain Score 04/11/21 1129 9     Pain Loc --      Pain Edu? --      Excl. in [place]? --    No data  found.  Updated Vital Signs BP 131/86   Pulse 63   Temp 98.9 F (37.2 C)   Resp 20   SpO2 100%   Visual Acuity Right Eye Distance:   Left Eye Distance:   Bilateral Distance:    Right Eye Near:   Left Eye Near:    Bilateral Near:     Physical Exam Vitals and nursing note reviewed.  Constitutional:      General: He is not in acute distress.    Appearance: He is not ill-appearing.  Cardiovascular:     Rate and Rhythm: Normal rate and regular rhythm.  Pulmonary:     Effort: Pulmonary effort is normal.     Breath sounds: Normal breath sounds.  Genitourinary:    Comments: Tender perianal mass.  Mass is somewhat fluctuant, painful and is at 3 o'clock position. Musculoskeletal:        General: No swelling, tenderness or signs of injury. Normal range of motion.  Neurological:     Mental Status: He is alert.     UC Treatments / Results  Labs (all labs ordered are listed, but only abnormal results are displayed) Labs Reviewed - No data to  display  EKG   Radiology   Procedures Procedures (including critical care time)  Medications Ordered in UC Medications - No data to display  Initial Impression / Assessment and Plan / UC Course  I have reviewed the triage vital signs and the nursing notes.  Pertinent labs & imaging results that were available during my care of the patient were reviewed by me and considered in my medical decision making (see chart for details).     1.  Perianal mass-tender Abscess versus thrombosed hemorrhoids Patient is advised to go to the emergency department to have further imaging and management.  If there is a perianal abscess patient will need formal incision and drainage.  Patient agrees with the plan of care. Final Clinical Impressions(s) / UC Diagnoses   Final diagnoses:  Perianal abscess     Discharge Instructions      Patient is advised to go to the emergency department for further evaluation   ED Prescriptions   None    PDMP not reviewed this encounter.   Chase Picket, MD 04/12/21 [phone]

## 2021-04-13 ENCOUNTER — Other Ambulatory Visit: Payer: Self-pay

## 2021-04-19 ENCOUNTER — Other Ambulatory Visit: Payer: Self-pay

## 2021-04-20 ENCOUNTER — Telehealth: Payer: Self-pay | Admitting: Internal Medicine

## 2021-04-20 DIAGNOSIS — K649 Unspecified hemorrhoids: Secondary | ICD-10-CM

## 2021-04-20 NOTE — Telephone Encounter (Signed)
Copied from [place] [phone]. Topic: Referral - Request for Referral >> Apr 13, 2021  9:51 AM Tessa Lerner A wrote: Has patient seen PCP for this complaint? No.  *If NO, is insurance requiring patient see PCP for this issue before PCP can refer them?  Referral for which specialty: Surgery Specialist  Preferred provider/office: [facility]  Reason for referral: Patient was directed to contact this facility by ER staff after a recent visit for a hernia on their rear

## 2021-04-20 NOTE — Telephone Encounter (Addendum)
Left voicemail advising patient to keep upcoming appointment with Dr. Wynetta Emery 05/05/21 at 0910  Please refer to surgeon if applicable prior to appointment.  He states he has  trouble moving bowels due to hemorrhoid.

## 2021-04-21 NOTE — Addendum Note (Signed)
Addended by: Karle Plumber B on: 04/21/2021 05:15 PM   Modules accepted: Orders

## 2021-04-21 NOTE — Telephone Encounter (Signed)
Left voicemail informing that referral was placed.

## 2021-05-05 ENCOUNTER — Encounter: Payer: Self-pay | Admitting: Internal Medicine

## 2021-05-05 ENCOUNTER — Ambulatory Visit: Payer: Self-pay | Attending: Internal Medicine | Admitting: Internal Medicine

## 2021-05-05 ENCOUNTER — Other Ambulatory Visit: Payer: Self-pay

## 2021-05-05 VITALS — BP 160/86 | HR 73 | Resp 16 | Wt 118.2 lb

## 2021-05-05 DIAGNOSIS — R030 Elevated blood-pressure reading, without diagnosis of hypertension: Secondary | ICD-10-CM

## 2021-05-05 DIAGNOSIS — Z125 Encounter for screening for malignant neoplasm of prostate: Secondary | ICD-10-CM

## 2021-05-05 DIAGNOSIS — K6289 Other specified diseases of anus and rectum: Secondary | ICD-10-CM

## 2021-05-05 DIAGNOSIS — I700 Atherosclerosis of aorta: Secondary | ICD-10-CM

## 2021-05-05 MED ORDER — HYDROCORTISONE (PERIANAL) 2.5 % EX CREA
TOPICAL_CREAM | CUTANEOUS | 0 refills | Status: DC
  Filled 2021-05-05: qty 30, 30d supply, fill #0

## 2021-05-05 NOTE — Patient Instructions (Signed)
Please apply for the orange card/cone discount card as soon as possible.  Once approved let me know so that we can resubmit referral to the general surgeon.  Try to limit salt in the foods as much as possible.  We will recheck your blood pressure on subsequent visit.  If still elevated, we will need to start you on blood pressure medication.

## 2021-05-05 NOTE — Progress Notes (Signed)
Patient ID: Travis Lutz, male    DOB: [birth year]  MRN: [id number]  CC: Hospitalization Follow-up (ED)   Subjective: Travis Lutz is a 65 y.o. male who presents for ER f/u His concerns today include:  Patient with history of EtOH abuse, tobacco dependence, chronic back pain, right femoral AVN.  Patient seen in the emergency room 04/11/2021 complaining of pain per rectum.  He was noted to have what looks like a hemorrhoid externally.  However CT of the abdomen and pelvis revealed a 1.3 cm subcutaneous fluid collection along the right posterior lateral aspect at the anal verge consistent with small abscess.  Incidental finding of aortic atherosclerosis.  Patient given Preparation H cream, MiraLAX and doxycycline antibiotics.  Told to follow-up with the surgeon.  Patient is uninsured.   Today he reports that he still has a knot outside the rectum which has been present x 1 mth.  He notes that the knot intermittently increases and decreases in size Hemorrhoid cream helped but has not taken it away completely.  Feels like a lump inside the rectum No drainage or blood per rectum Stools have been soft lately He has completed abx.  No fever.  Referred to general surgeon but no appt as yet.    BP elev today and noted to have been elevated in the past on reviewing his chart.  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 60 tablet 0   doxycycline (VIBRAMYCIN) 100 MG capsule Take 1 capsule (100 mg total) by mouth 2 (two) times daily for 7 days (Patient not taking: Reported on 05/05/2021) 14 capsule 0   No current facility-administered medications on file prior to visit.    No Known Allergies  Social History   Socioeconomic History   Marital status: Significant Other    Spouse name: Not on file   Number of children: Not on file   Years of education: Not on file   Highest education level: Not on file  Occupational History   Not on file  Tobacco Use   Smoking status: Every Day    Packs/day: 0.25    Types: Cigarettes   Smokeless tobacco: Never  Vaping Use   Vaping Use: Never used  Substance and Sexual Activity   Alcohol use: Yes    Alcohol/week: 0.0 standard drinks    Comment: beer daily    Drug use: Yes    Types: Marijuana, Cocaine    Comment: marijuana yesterday, cocaine last week   Sexual activity: Yes  Other Topics Concern   Not on file  Social History Narrative   Not on file   Social Determinants of Health   Financial Resource Strain: Not on file  Food Insecurity: Not on file  Transportation Needs: Not on file  Physical Activity:  Not on file  Stress: Not on file  Social Connections: Not on file  Intimate Partner Violence: Not on file    Family History  Family history unknown: Yes    Past Surgical History:  Procedure Laterality Date   REPAIR OF PERFORATED ULCER      ROS: Review of Systems Negative except as stated above  PHYSICAL EXAM: BP (!) 160/86   Pulse 73   Resp 16   Wt 118 lb 3.2 oz (53.6 kg)   SpO2 100%   BMI 15.59 kg/m   Wt Readings from Last 3 Encounters:  05/05/21 118 lb 3.2 oz (53.6 kg)  04/11/21 130 lb (59 kg)  11/28/19 123 lb 3.2 oz (55.9 kg)    Physical Exam   General appearance - alert, well appearing, older African-American male and in no distress Mental  status - normal mood, behavior, speech, dress, motor activity, and thought processes Chest - clear to auscultation, no wheezes, rales or rhonchi, symmetric air entry Heart - normal rate, regular rhythm, normal S1, S2, no murmurs, rubs, clicks or gallops Abdomen - soft, nontender, nondistended, no masses or organomegaly Rectal -CMA Pollock present: Patient has blueberry sized firm round mass located between the 5 to 7 o'clock position.  It is not tender to touch.  No masses felt inside the rectum.  Hemoccult test was negative.  CMP Latest Ref Rng & Units 04/10/2021 12/06/2020 09/08/2020  Glucose 70 - 99 mg/dL 99 102(H) 104(H)  BUN 8 - 23 mg/dL 12 6(L) 8  Creatinine 0.61 - 1.24 mg/dL 0.84 0.84 0.80  Sodium 135 - 145 mmol/L 136 140 138  Potassium 3.5 - 5.1 mmol/L 4.3 3.9 3.8  Chloride 98 - 111 mmol/L 105 107 104  CO2 22 - 32 mmol/L '24 27 23  '$ Calcium 8.9 - 10.3 mg/dL 9.2 9.1 9.2  Total Protein 6.5 - 8.1 g/dL - 7.5 -  Total Bilirubin 0.3 - 1.2 mg/dL - 0.5 -  Alkaline Phos 38 - 126 U/L - 75 -  AST 15 - 41 U/L - 20 -  ALT 0 - 44 U/L - 16 -   Lipid Panel  No results found for: CHOL, TRIG, HDL, CHOLHDL, VLDL, LDLCALC, LDLDIRECT  CBC    Component Value Date/Time   WBC 6.2 04/11/2021 1424   RBC 4.12 (L) 04/11/2021 1424   HGB 14.4 04/11/2021 1424   HCT 42.9 04/11/2021 1424   PLT 319 04/11/2021 1424   MCV 104.1 (H) 04/11/2021 1424   MCH 35.0 (H) 04/11/2021 1424   MCHC 33.6 04/11/2021 1424   RDW 14.1 04/11/2021 1424   LYMPHSABS 1.2 04/11/2021 1424   MONOABS 0.7 04/11/2021 1424   EOSABS 0.1 04/11/2021 1424   BASOSABS 0.0 04/11/2021 1424    ASSESSMENT AND PLAN: 1. Rectal mass This does look like hemorrhoid.  However given findings on CAT scan and the fact that this is bothersome to him, I think getting him to a surgeon is reasonable.  He does not have insurance.  We gave him the forms for the orange card/cone discount card.  I have told him how to go about getting this.  Once he is approved  he will let me know so that I can resubmit the referral to the surgeon. Discussed the importance of keeping bowel movements soft and regular.  Recommend sitz bath's. - hydrocortisone (ANUSOL-HC) 2.5 % rectal cream; Apply to rectal area twice a day as needed  Dispense: 30 g; Refill: 0  2. Elevated blood pressure reading without diagnosis  of hypertension DASH diet discussed and encouraged.  We will bring him back in several weeks for repeat blood pressure and for physical.  3. Aortic atherosclerosis ([place]) Discussed the fact that this can put him at risk for cardiovascular events.  Check lipid profile - Lipid panel  4. Prostate cancer screening Due for prostate cancer screening.  Patient is agreeable to PSA check. - PSA   Patient was given the opportunity to ask questions.  Patient verbalized understanding of the plan and was able to repeat key elements of the plan.   Orders Placed This Encounter  Procedures   Lipid panel   PSA     Requested Prescriptions   Signed Prescriptions Disp Refills   hydrocortisone (ANUSOL-HC) 2.5 % rectal cream 30 g 0    Sig: Apply to rectal area twice a day as needed    Return in about 4 weeks (around 06/02/2021) for Physical.  Karle Plumber, MD, FACP

## 2021-05-06 ENCOUNTER — Other Ambulatory Visit: Payer: Self-pay | Admitting: Internal Medicine

## 2021-05-06 ENCOUNTER — Encounter: Payer: Self-pay | Admitting: Internal Medicine

## 2021-05-06 DIAGNOSIS — R972 Elevated prostate specific antigen [PSA]: Secondary | ICD-10-CM | POA: Insufficient documentation

## 2021-05-06 DIAGNOSIS — E782 Mixed hyperlipidemia: Secondary | ICD-10-CM | POA: Insufficient documentation

## 2021-05-06 LAB — PSA: Prostate Specific Ag, Serum: 4.9 ng/mL — ABNORMAL HIGH (ref 0.0–4.0)

## 2021-05-06 LAB — LIPID PANEL
Chol/HDL Ratio: 4.1 ratio (ref 0.0–5.0)
Cholesterol, Total: 289 mg/dL — ABNORMAL HIGH (ref 100–199)
HDL: 71 mg/dL (ref >39)
LDL Chol Calc (NIH): 209 mg/dL — ABNORMAL HIGH (ref 0–99)
Triglycerides: 64 mg/dL (ref 0–149)
VLDL Cholesterol Cal: 9 mg/dL (ref 5–40)

## 2021-05-06 MED ORDER — ATORVASTATIN CALCIUM 10 MG PO TABS
10.0000 mg | ORAL_TABLET | Freq: Every day | ORAL | 3 refills | Status: DC
  Filled 2021-05-06: qty 30, 30d supply, fill #0

## 2021-05-06 NOTE — Progress Notes (Signed)
Let patient know that his cholesterol is elevated at 209 with goal being less than 100.  I recommend starting a medication called atorvastatin to help lower the cholesterol.  I have sent the prescription to our pharmacy for pickup.  PSA which is the screening test for prostate cancer is is abnormal.  Please make sure that he applies for the orange card/cone discount card as discussed on recent visit so that we can refer him to the urologist on his next visit with me in September.

## 2021-05-07 ENCOUNTER — Other Ambulatory Visit: Payer: Self-pay

## 2021-05-09 ENCOUNTER — Other Ambulatory Visit: Payer: Self-pay

## 2021-05-11 ENCOUNTER — Other Ambulatory Visit: Payer: Self-pay

## 2021-05-11 ENCOUNTER — Telehealth: Payer: Self-pay

## 2021-05-11 NOTE — Telephone Encounter (Signed)
Contacted pt to go over lab results pt is aware and doesn't have any questions or concerns 

## 2021-05-12 ENCOUNTER — Other Ambulatory Visit: Payer: Self-pay

## 2021-05-13 ENCOUNTER — Ambulatory Visit: Payer: Self-pay

## 2021-05-20 ENCOUNTER — Ambulatory Visit: Payer: Self-pay

## 2021-05-25 ENCOUNTER — Other Ambulatory Visit: Payer: Self-pay

## 2021-05-25 ENCOUNTER — Ambulatory Visit: Payer: Self-pay | Attending: Internal Medicine

## 2021-05-30 ENCOUNTER — Other Ambulatory Visit: Payer: Self-pay

## 2021-05-30 ENCOUNTER — Encounter: Payer: Self-pay | Admitting: Internal Medicine

## 2021-05-30 ENCOUNTER — Ambulatory Visit: Payer: Self-pay | Attending: Internal Medicine | Admitting: Internal Medicine

## 2021-05-30 VITALS — BP 165/93 | HR 76 | Resp 16 | Ht 69.0 in | Wt 120.2 lb

## 2021-05-30 DIAGNOSIS — I10 Essential (primary) hypertension: Secondary | ICD-10-CM

## 2021-05-30 DIAGNOSIS — Z1211 Encounter for screening for malignant neoplasm of colon: Secondary | ICD-10-CM

## 2021-05-30 DIAGNOSIS — K6289 Other specified diseases of anus and rectum: Secondary | ICD-10-CM

## 2021-05-30 DIAGNOSIS — Z1159 Encounter for screening for other viral diseases: Secondary | ICD-10-CM

## 2021-05-30 DIAGNOSIS — Z1331 Encounter for screening for depression: Secondary | ICD-10-CM

## 2021-05-30 DIAGNOSIS — Z114 Encounter for screening for human immunodeficiency virus [HIV]: Secondary | ICD-10-CM

## 2021-05-30 DIAGNOSIS — Z122 Encounter for screening for malignant neoplasm of respiratory organs: Secondary | ICD-10-CM

## 2021-05-30 DIAGNOSIS — F1410 Cocaine abuse, uncomplicated: Secondary | ICD-10-CM

## 2021-05-30 DIAGNOSIS — K029 Dental caries, unspecified: Secondary | ICD-10-CM

## 2021-05-30 DIAGNOSIS — F17200 Nicotine dependence, unspecified, uncomplicated: Secondary | ICD-10-CM

## 2021-05-30 DIAGNOSIS — R972 Elevated prostate specific antigen [PSA]: Secondary | ICD-10-CM

## 2021-05-30 DIAGNOSIS — D649 Anemia, unspecified: Secondary | ICD-10-CM

## 2021-05-30 DIAGNOSIS — F17210 Nicotine dependence, cigarettes, uncomplicated: Secondary | ICD-10-CM

## 2021-05-30 DIAGNOSIS — E782 Mixed hyperlipidemia: Secondary | ICD-10-CM

## 2021-05-30 DIAGNOSIS — Z0000 Encounter for general adult medical examination without abnormal findings: Secondary | ICD-10-CM

## 2021-05-30 DIAGNOSIS — R5383 Other fatigue: Secondary | ICD-10-CM

## 2021-05-30 DIAGNOSIS — Z23 Encounter for immunization: Secondary | ICD-10-CM

## 2021-05-30 HISTORY — DX: Essential (primary) hypertension: I10

## 2021-05-30 MED ORDER — ATORVASTATIN CALCIUM 10 MG PO TABS
10.0000 mg | ORAL_TABLET | Freq: Every day | ORAL | 3 refills | Status: DC
  Filled 2021-05-30: qty 30, 30d supply, fill #0

## 2021-05-30 MED ORDER — AMLODIPINE BESYLATE 5 MG PO TABS
5.0000 mg | ORAL_TABLET | Freq: Every day | ORAL | 1 refills | Status: DC
  Filled 2021-05-30: qty 30, 30d supply, fill #0

## 2021-05-30 MED ORDER — NICOTINE 14 MG/24HR TD PT24
14.0000 mg | MEDICATED_PATCH | Freq: Every day | TRANSDERMAL | 2 refills | Status: DC
  Filled 2021-05-30: qty 28, 28d supply, fill #0

## 2021-05-30 NOTE — Progress Notes (Signed)
Patient ID: Travis Lutz, male    DOB: 17-Jun-1956  MRN: [id number]  CC: Annual Exam   Subjective: Travis Lutz is a 65 y.o. male who presents for physical His concerns today include:  Patient with history of EtOH abuse, tobacco dependence, chronic back pain, right femoral AVN, aortic atherosclerosis.  Visit 05/05/2021:  Patient seen in the emergency room 04/11/2021 complaining of pain per rectum.  He was noted to have what looks like a hemorrhoid externally.  However CT of the abdomen and pelvis revealed a 1.3 cm subcutaneous fluid collection along the right posterior lateral aspect at the anal verge consistent with small abscess.  Incidental finding of aortic atherosclerosis.  Patient given Preparation H cream, MiraLAX and doxycycline antibiotics.  Told to follow-up with the surgeon.  Patient is uninsured.   On last visit, rectal exam revealed what looked like a hemorrhoid.  Patient was given some Anusol cream and told to do sitz bath's.  Advised to apply for the orange card so that we can refer him to a general surgeon.  He has since been approved for the orange card. Reports that rectal swelling has significantly decreased.     BP elev on last visit.  DASH diet was advised.  He has been doing that.  Blood pressure still elevated today.   C/o of feeling sluggish all the time.  Gets in bed around 11 p.m and gets up around 7 a.m.  Takes about 1.5 hrs to fall asleep.  Sleeps with TV on.  He does not snore.  Noted to have microcytic anemia on CBC that was done in July of this year.  He denies excessive alcohol use.  He tells me he drinks a 16 ounce cans of beer about 3 days a week. Reports dizzy spells, 24/7.  Not sure how to describe it. Worse with walking and riding his bicycle. No rectal bleeding or hematuria No problems swallowing.  Has several cavities that need to come out. Gets dental abscess frequently because of it.  Uses marijuana  several days a wk. Snorts cocaine off and on  "about once in 6 mths."  Not able to say when he last used. No fever but reports night sweats for the past several years No unexplained weight loss..    Smoked 1 pk Q 10 days.  Smoked since age 51.  At most he was 1/2 pk/day.  Wants to quit.  Never quit before.   PSA was mildly elev on last visit at 4.9.  Does not feel like he empties bladder completely.  Wakes up 4-5x/night to urinate. Urinate flows freely.  Endorses postvoid dribbling. Drinks about 2 cups of coffee at nights around 9 p.m.  Endorses drink several cans of Sprite with dinner  HL: T.chol and LDL cholesterol elev on last visit.  Recommend starting Lipitor.  He was taking but misplaced the bottle 2 days ago.    Denies any issue with feeling depress but depression screen is positive.  HM:  due for colon CA screen.  Due for flu shot.  Reports having had 2 COVID shots.     Patient Active Problem List   Diagnosis Date Noted   Mixed hyperlipidemia 05/06/2021   Elevated PSA 05/06/2021   Dyspnea 02/29/2016   Chronic low back pain 12/06/2015   Bilateral chronic knee pain 12/06/2015   Foot pain, bilateral 12/06/2015   Vitamin D deficiency 11/16/2015   Migraine headache 11/15/2015   Testicular pain 11/15/2015   Chronic paronychia of finger  of left hand 11/15/2015   Homeless single person 11/15/2015   Smoker 11/15/2015   Alcohol use disorder 11/15/2015     Current Outpatient Medications on File Prior to Visit  Medication Sig Dispense Refill   acetaminophen (TYLENOL) 500 MG tablet Take 1 tablet (500 mg total) by mouth every 6 (six) hours as needed. 60 tablet 0   atorvastatin (LIPITOR) 10 MG tablet Take 1 tablet (10 mg total) by mouth daily. 30 tablet 3   hydrocortisone (ANUSOL-HC) 2.5 % rectal cream Apply to rectal area twice a day as needed 30 g 0   No current facility-administered medications on file prior to visit.    No Known Allergies  Social History   Socioeconomic History   Marital status: Significant Other     Spouse name: Not on file   Number of children: Not on file   Years of education: Not on file   Highest education level: Not on file  Occupational History   Not on file  Tobacco Use   Smoking status: Every Day    Packs/day: 0.25    Types: Cigarettes   Smokeless tobacco: Never  Vaping Use   Vaping Use: Never used  Substance and Sexual Activity   Alcohol use: Yes    Alcohol/week: 0.0 standard drinks    Comment: beer daily    Drug use: Yes    Types: Marijuana, Cocaine    Comment: marijuana yesterday, cocaine last week   Sexual activity: Yes  Other Topics Concern   Not on file  Social History Narrative   Not on file   Social Determinants of Health   Financial Resource Strain: Not on file  Food Insecurity: Not on file  Transportation Needs: Not on file  Physical Activity: Not on file  Stress: Not on file  Social Connections: Not on file  Intimate Partner Violence: Not on file    Family History  Family history unknown: Yes    Past Surgical History:  Procedure Laterality Date   REPAIR OF PERFORATED ULCER      ROS: Review of Systems Negative except as stated above  PHYSICAL EXAM: BP (!) 165/93   Pulse 76   Resp 16   Ht '5\' 9"'$  (1.753 m)   Wt 120 lb 3.2 oz (54.5 kg)   SpO2 97%   BMI 17.75 kg/m   Wt Readings from Last 3 Encounters:  05/30/21 120 lb 3.2 oz (54.5 kg)  05/05/21 118 lb 3.2 oz (53.6 kg)  04/11/21 130 lb (59 kg)  Sitting: BP 174/96, pulse 63 Standing: BP 166/94, pulse of 71  Physical Exam General appearance -older African-American male who appears underweight with mild temporal wasting Mental status - normal mood, behavior, speech, dress, motor activity, and thought processes Eyes -Pink conjunctiva.  Positive arcus senilus Ears - bilateral TM's and external ear canals normal Nose - normal and patent, no erythema, discharge or polyps Mouth - mucous membranes moist, pharynx normal without lesions.  He has only 3 teeth in the upper jaw.  The third  molar in the upper jaw on the right side is decayed and broken off in the gum. Neck - supple, no significant adenopathy.  No thyroid enlargement or nodules.  No carotid bruits heard. Lymphatics - no palpable lymphadenopathy, no hepatosplenomegaly Chest -breath sounds mildly decreased bilaterally without wheezes or crackles. Heart - normal rate, regular rhythm, normal S1, S2, no murmurs, rubs, clicks or gallops Abdomen - soft, nontender, nondistended, no masses or organomegaly Neurological - cranial nerves II through  XII intact, motor and sensory grossly normal bilaterally.  Gait is normal. Musculoskeletal -no abnormal joint enlargement Extremities - peripheral pulses normal, no pedal edema, no clubbing or cyanosis Skin -nails are unkept, discolored and some are black/hyperpigmented  Depression screen [facility] 2/9 05/05/2021 11/28/2019 02/29/2016  Decreased Interest 2 0 3  Down, Depressed, Hopeless 1 0 3  PHQ - 2 Score 3 0 6  Altered sleeping 1 - 3  Tired, decreased energy 3 - 3  Change in appetite 3 - 3  Feeling bad or failure about yourself  0 - 3  Trouble concentrating 0 - 1  Moving slowly or fidgety/restless 0 - 1  Suicidal thoughts 0 - 1  PHQ-9 Score 10 - 21   GAD 7 : Generalized Anxiety Score 05/05/2021 11/28/2019 02/29/2016 11/15/2015  Nervous, Anxious, on Edge 0 0 1 -  Control/stop worrying 0 '1 1 1  '$ Worry too much - different things 0 0 1 1  Trouble relaxing '3 3 1 1  '$ Restless 0 0 - 0  Easily annoyed or irritable 0 1 1 0  Afraid - awful might happen 0 0 1 0  Total GAD 7 Score 3 5 - -     CMP Latest Ref Rng & Units 04/10/2021 12/06/2020 09/08/2020  Glucose 70 - 99 mg/dL 99 102(H) 104(H)  BUN 8 - 23 mg/dL 12 6(L) 8  Creatinine 0.61 - 1.24 mg/dL 0.84 0.84 0.80  Sodium 135 - 145 mmol/L 136 140 138  Potassium 3.5 - 5.1 mmol/L 4.3 3.9 3.8  Chloride 98 - 111 mmol/L 105 107 104  CO2 22 - 32 mmol/L '24 27 23  '$ Calcium 8.9 - 10.3 mg/dL 9.2 9.1 9.2  Total Protein 6.5 - 8.1 g/dL - 7.5 -  Total  Bilirubin 0.3 - 1.2 mg/dL - 0.5 -  Alkaline Phos 38 - 126 U/L - 75 -  AST 15 - 41 U/L - 20 -  ALT 0 - 44 U/L - 16 -   Lipid Panel     Component Value Date/Time   CHOL 289 (H) 05/05/2021 1006   TRIG 64 05/05/2021 1006   HDL 71 05/05/2021 1006   CHOLHDL 4.1 05/05/2021 1006   LDLCALC 209 (H) 05/05/2021 1006    CBC    Component Value Date/Time   WBC 6.2 04/11/2021 1424   RBC 4.12 (L) 04/11/2021 1424   HGB 14.4 04/11/2021 1424   HCT 42.9 04/11/2021 1424   PLT 319 04/11/2021 1424   MCV 104.1 (H) 04/11/2021 1424   MCH 35.0 (H) 04/11/2021 1424   MCHC 33.6 04/11/2021 1424   RDW 14.1 04/11/2021 1424   LYMPHSABS 1.2 04/11/2021 1424   MONOABS 0.7 04/11/2021 1424   EOSABS 0.1 04/11/2021 1424   BASOSABS 0.0 04/11/2021 1424    ASSESSMENT AND PLAN: 1. Physical exam   2. Essential hypertension True hypertension.  DASH diet encouraged.  Start amlodipine. - amLODipine (NORVASC) 5 MG tablet; Take 1 tablet (5 mg total) by mouth daily.  Dispense: 90 tablet; Refill: 1  3. Mixed hyperlipidemia Continue atorvastatin. Advised patient that high blood pressure, cholesterol and cigarette smoking increases his risk for heart attack and strokes. - atorvastatin (LIPITOR) 10 MG tablet; Take 1 tablet (10 mg total) by mouth daily.  Dispense: 30 tablet; Refill: 3 - Hepatic function panel  4. Tobacco dependence Pt is current smoker. Patient advised to quit smoking. Discussed health risks associated with smoking including lung and other types of cancers, chronic lung diseases and CV risks.. Pt ready to  give trail of quitting.   Discussed methods to help quit including quitting cold [place], use of NRT, Chantix and Bupropion.  Pt wanting to try: Nicotine patches.  We will start him at the 14 mg patch.  I went over with him how to use it. _3_ Minutes spent on counseling. F/U:    - nicotine (NICODERM CQ - DOSED IN MG/24 HOURS) 14 mg/24hr patch; Place 1 patch (14 mg total) onto the skin daily.   Dispense: 28 patch; Refill: 2  5. Rectal mass Referral to general surgery now that he has the orange card.  6. Elevated PSA Advised patient to not drink caffeinated beverages within 5 hours of bedtime.  He is drinking 2 cups of coffee and caffeinated sodas in the evening.  Advised that caffeine is an irritant to the bladder and is most likely contributing to the frequent urination at night. - Ambulatory referral to Urology  7. Cocaine abuse, episodic use ([place]) Strongly advised to discontinue completely.  8. Dental cavities - Ambulatory referral to Dentistry  9. Anemia, unspecified type - Iron, TIBC and Ferritin Panel - Vitamin B12 - Folate - CBC  10. Screening for colon cancer -Patient agreeable for colonoscopy.  I think this is reasonable given him being underweight with anemia. - Ambulatory referral to Gastroenterology  11. Need for hepatitis C screening test - Hepatitis C Antibody  12. Screening for HIV (human immunodeficiency virus) Patient agreeable to screening. - HIV antibody (with reflex)  13. Need for immunization against influenza - Flu Vaccine QUAD 21moIM (Fluarix, Fluzone & Alfiuria Quad PF)  14. Screening for lung cancer Discussed lung cancer screening.  He meets the criteria.  Patient is willing to be screened with low-dose CT. - CT CHEST LUNG CA SCREEN LOW DOSE W/O CM; Future  15. Fatigue, unspecified type Good sleep hygiene discussed and encouraged.  Advised patient to stop drinking caffeinated beverages within 5 hours of bedtime and to turn off all lights and sounds once he gets in bed.   16. Positive depression screening Patient denies major issues with depression.    Patient was given the opportunity to ask questions.  Patient verbalized understanding of the plan and was able to repeat key elements of the plan.   Orders Placed This Encounter  Procedures   Flu Vaccine QUAD 61moM (Fluarix, Fluzone & Alfiuria Quad PF)     Requested  Prescriptions    No prescriptions requested or ordered in this encounter    No follow-ups on file.  DeKarle PlumberMD, FACP

## 2021-05-31 LAB — IRON,TIBC AND FERRITIN PANEL
Ferritin: 220 ng/mL (ref 30–400)
Iron Saturation: 19 % (ref 15–55)
Iron: 59 ug/dL (ref 38–169)
Total Iron Binding Capacity: 316 ug/dL (ref 250–450)
UIBC: 257 ug/dL (ref 111–343)

## 2021-05-31 LAB — HEPATIC FUNCTION PANEL
ALT: 16 IU/L (ref 0–44)
AST: 21 IU/L (ref 0–40)
Albumin: 4.6 g/dL (ref 3.8–4.8)
Alkaline Phosphatase: 99 IU/L (ref 44–121)
Bilirubin Total: <0.2 mg/dL (ref 0.0–1.2)
Bilirubin, Direct: <0.1 mg/dL (ref 0.00–0.40)
Total Protein: 7.5 g/dL (ref 6.0–8.5)

## 2021-05-31 LAB — FOLATE: Folate: 10.2 ng/mL (ref >3.0)

## 2021-05-31 LAB — CBC
Hematocrit: 42.5 % (ref 37.5–51.0)
Hemoglobin: 14.7 g/dL (ref 13.0–17.7)
MCH: 34.4 pg — ABNORMAL HIGH (ref 26.6–33.0)
MCHC: 34.6 g/dL (ref 31.5–35.7)
MCV: 100 fL — ABNORMAL HIGH (ref 79–97)
Platelets: 288 10*3/uL (ref 150–450)
RBC: 4.27 x10E6/uL (ref 4.14–5.80)
RDW: 13.4 % (ref 11.6–15.4)
WBC: 4.6 10*3/uL (ref 3.4–10.8)

## 2021-05-31 LAB — HEPATITIS C ANTIBODY: Hep C Virus Ab: <0.1 s/co ratio (ref 0.0–0.9)

## 2021-05-31 LAB — HIV ANTIBODY (ROUTINE TESTING W REFLEX): HIV Screen 4th Generation wRfx: NONREACTIVE

## 2021-05-31 LAB — VITAMIN B12: Vitamin B-12: 291 pg/mL (ref 232–1245)

## 2021-06-01 ENCOUNTER — Telehealth: Payer: Self-pay

## 2021-06-01 ENCOUNTER — Other Ambulatory Visit: Payer: Self-pay | Admitting: Internal Medicine

## 2021-06-01 ENCOUNTER — Other Ambulatory Visit: Payer: Self-pay

## 2021-06-01 DIAGNOSIS — E538 Deficiency of other specified B group vitamins: Secondary | ICD-10-CM | POA: Insufficient documentation

## 2021-06-01 MED ORDER — VITAMIN B-12 1000 MCG PO TABS
1000.0000 ug | ORAL_TABLET | Freq: Every day | ORAL | 2 refills | Status: DC
  Filled 2021-06-01: qty 100, 100d supply, fill #0

## 2021-06-01 NOTE — Telephone Encounter (Signed)
Contacted pt to go over lab results pt is aware of results and DOB was confirmed.

## 2021-06-01 NOTE — Progress Notes (Signed)
HIV and hepatitis C screening tests are negative. Iron levels are okay. Vitamin B12 level is low.  Recommend starting B12 supplement daily.  Prescription sent to our pharmacy.   Liver function test normal.

## 2021-06-02 ENCOUNTER — Telehealth: Payer: Self-pay

## 2021-06-02 NOTE — Telephone Encounter (Signed)
Contacted pt to go over appointment information.   CT is scheduled for Sept 30 at 330pm at [facility]. Pt will need to arrive at 315pm.   Pt is aware and doesn't have any questions or concerns

## 2021-06-10 ENCOUNTER — Ambulatory Visit (HOSPITAL_COMMUNITY): Payer: Self-pay

## 2021-06-17 ENCOUNTER — Ambulatory Visit (HOSPITAL_COMMUNITY)
Admission: RE | Admit: 2021-06-17 | Discharge: 2021-06-17 | Disposition: A | Discharge status: Home / Self Care | Payer: Self-pay | Source: Ambulatory Visit | Attending: Internal Medicine | Admitting: Internal Medicine

## 2021-06-17 ENCOUNTER — Other Ambulatory Visit: Payer: Self-pay

## 2021-06-17 DIAGNOSIS — Z122 Encounter for screening for malignant neoplasm of respiratory organs: Secondary | ICD-10-CM | POA: Insufficient documentation

## 2021-06-22 ENCOUNTER — Other Ambulatory Visit: Payer: Self-pay

## 2021-06-22 ENCOUNTER — Telehealth: Payer: Self-pay | Admitting: Internal Medicine

## 2021-06-22 DIAGNOSIS — K029 Dental caries, unspecified: Secondary | ICD-10-CM

## 2021-06-22 DIAGNOSIS — D3502 Benign neoplasm of left adrenal gland: Secondary | ICD-10-CM

## 2021-06-22 MED ORDER — DEXAMETHASONE 1 MG PO TABS
1.0000 mg | ORAL_TABLET | Freq: Every evening | ORAL | 0 refills | Status: DC
  Filled 2021-06-22: qty 1, 1d supply, fill #0

## 2021-06-22 NOTE — Telephone Encounter (Signed)
Phone call placed to patient today to go over results of low-dose CT scan that was done for lung cancer screening.  Patient advised that the scan showed small nodule in the left lung.  We will need to repeat CAT scan in about 1 year to make sure there is no change in size.  Incidental findings of centrilobular emphysematous changes.  Based on both of these findings, I strongly encouraged him to discontinue smoking.  Incidental finding also noted of aortic atherosclerosis.  He is already on atorvastatin.  I have encouraged him to take the medication consistently.  Incidental finding of 2.6 cm adrenal adenoma on the left adrenal gland.  I recommend that he returns to the lab to have testing done to evaluate whether this is a functional lesion or not.  Testing will include low-dose dexamethasone suppression test to screen for Cushing's.  Patient advised to pick up the prescription for the dexamethasone from the pharmacy and take it at 11 PM the day before he plans to come to the laboratory.  Once he takes it he has to come to the laboratory at 8:30 AM the following morning.  Patient expressed understanding.  He also requests referral to a dentist for cavities.

## 2021-06-23 ENCOUNTER — Other Ambulatory Visit: Payer: Self-pay

## 2021-06-23 ENCOUNTER — Telehealth: Payer: Self-pay | Admitting: *Deleted

## 2021-06-23 DIAGNOSIS — Z0120 Encounter for dental examination and cleaning without abnormal findings: Secondary | ICD-10-CM

## 2021-06-23 NOTE — Telephone Encounter (Signed)
Copied from [place] [id number]. Topic: Referral - Request for Referral >> Jun 22, 2021 12:17 PM [name] wrote: Has patient seen PCP for this complaint? Yes.   *If NO, is insurance requiring patient see PCP for this issue before PCP can refer them? Referral for which specialty: Dentist Preferred provider/office: no specific provider or location Reason for referral: Pt requests referral for a dentist

## 2021-06-24 NOTE — Telephone Encounter (Signed)
Will forward to provider to place referral  

## 2021-06-24 NOTE — Addendum Note (Signed)
Addended by: Karle Plumber B on: 06/24/2021 12:58 PM   Modules accepted: Orders

## 2021-06-28 ENCOUNTER — Other Ambulatory Visit: Payer: Self-pay

## 2021-06-29 ENCOUNTER — Other Ambulatory Visit: Payer: Self-pay

## 2021-06-29 ENCOUNTER — Ambulatory Visit: Payer: Self-pay | Attending: Internal Medicine

## 2021-06-29 DIAGNOSIS — D3502 Benign neoplasm of left adrenal gland: Secondary | ICD-10-CM

## 2021-07-05 LAB — ALDOSTERONE + RENIN ACTIVITY W/ RATIO
ALDOS/RENIN RATIO: 6 (ref 0.0–30.0)
ALDOSTERONE: 1.8 ng/dL (ref 0.0–30.0)
Renin: 0.299 ng/mL/hr (ref 0.167–5.380)

## 2021-07-05 LAB — METANEPHRINES, PLASMA
Metanephrine, Free: 33.3 pg/mL (ref 0.0–88.0)
Normetanephrine, Free: 216.3 pg/mL (ref 0.0–285.2)

## 2021-07-05 LAB — CORTISOL-AM, BLOOD: Cortisol - AM: 4.2 ug/dL — ABNORMAL LOW (ref 6.2–19.4)

## 2021-07-06 ENCOUNTER — Other Ambulatory Visit: Payer: Self-pay | Admitting: Internal Medicine

## 2021-07-06 ENCOUNTER — Encounter: Payer: Self-pay | Admitting: Internal Medicine

## 2021-07-06 DIAGNOSIS — D3502 Benign neoplasm of left adrenal gland: Secondary | ICD-10-CM

## 2021-07-06 NOTE — Progress Notes (Signed)
Let patient know that one of his hormone levels that we checked came back abnormal.  I will refer him to an endocrinologist for further evaluation.

## 2021-07-07 ENCOUNTER — Telehealth: Payer: Self-pay

## 2021-07-07 NOTE — Telephone Encounter (Signed)
Contacted pt to go over lab results pt is aware and doesn't have any questions or concerns 

## 2021-07-28 ENCOUNTER — Ambulatory Visit: Payer: Self-pay | Admitting: Surgery

## 2021-07-28 ENCOUNTER — Telehealth: Payer: Self-pay | Admitting: Internal Medicine

## 2021-07-28 NOTE — Telephone Encounter (Signed)
Will forward to referral coordinator.

## 2021-07-28 NOTE — Telephone Encounter (Signed)
Copied from [place] [id number]. Topic: Referral - Status >> Jul 25, 2021  1:51 PM [name] wrote: Reason for CRM: Pt stated his primary form of transportation is by bicycle so its no way for him to be able to get to [facility] for an appt.

## 2021-07-29 ENCOUNTER — Ambulatory Visit: Payer: Self-pay | Admitting: Internal Medicine

## 2021-09-22 ENCOUNTER — Telehealth: Payer: Self-pay | Admitting: Internal Medicine

## 2021-09-22 NOTE — Telephone Encounter (Signed)
Copied from [place] [id number]. Topic: General - Other >> May 17, 2021 10:56 AM Alanda Slim E wrote: Reason for CRM: Pt has appt with carlos this week and wanted to make sure that his 10W 40 from 2016 will be ok/ he stated that was the last one he has/ please advise asap before appt  I return Pt call, he claim that he did not call me

## 2022-01-01 IMAGING — CR DG LUMBAR SPINE COMPLETE 4+V
5 series · 5 of 5 positions shown · non-contrast
Comparison: 04/26/2011

CLINICAL DATA: Fell 10 days ago, right posterior lower back pain

EXAM:
LUMBAR SPINE - COMPLETE 4+ VIEW

[l-spine ap]
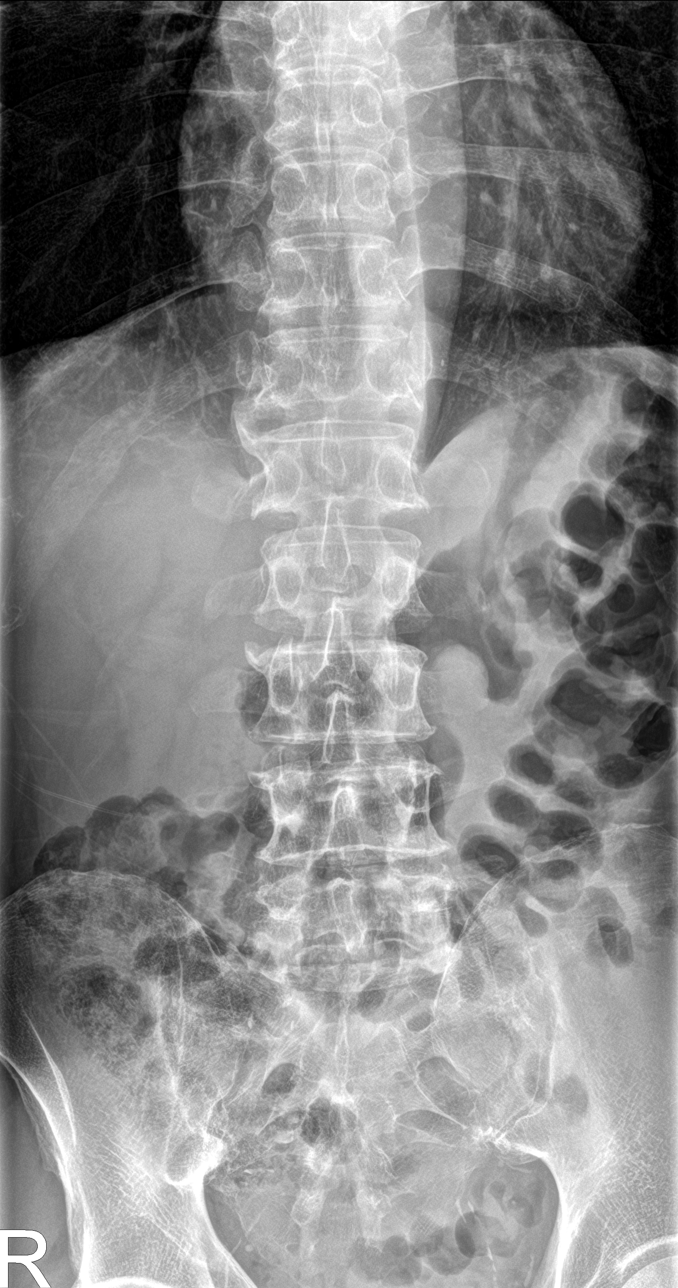

[l-spine obl (1 of 2)]
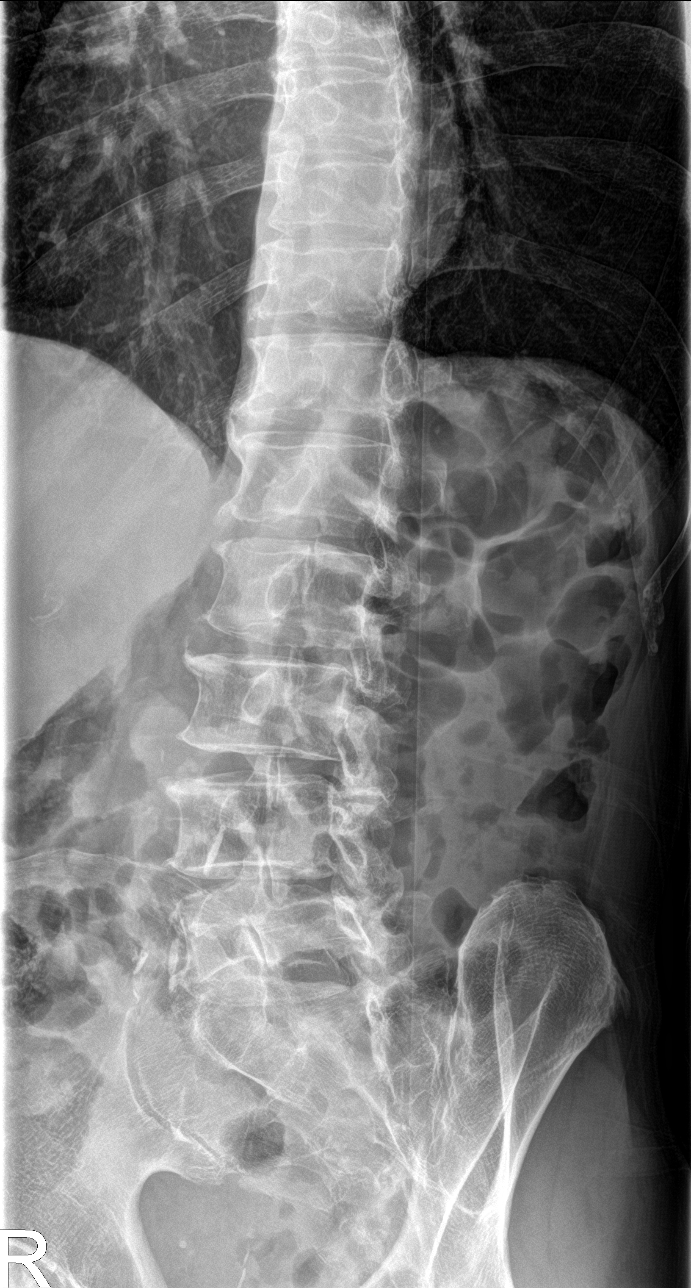

[l-spine obl (2 of 2)]
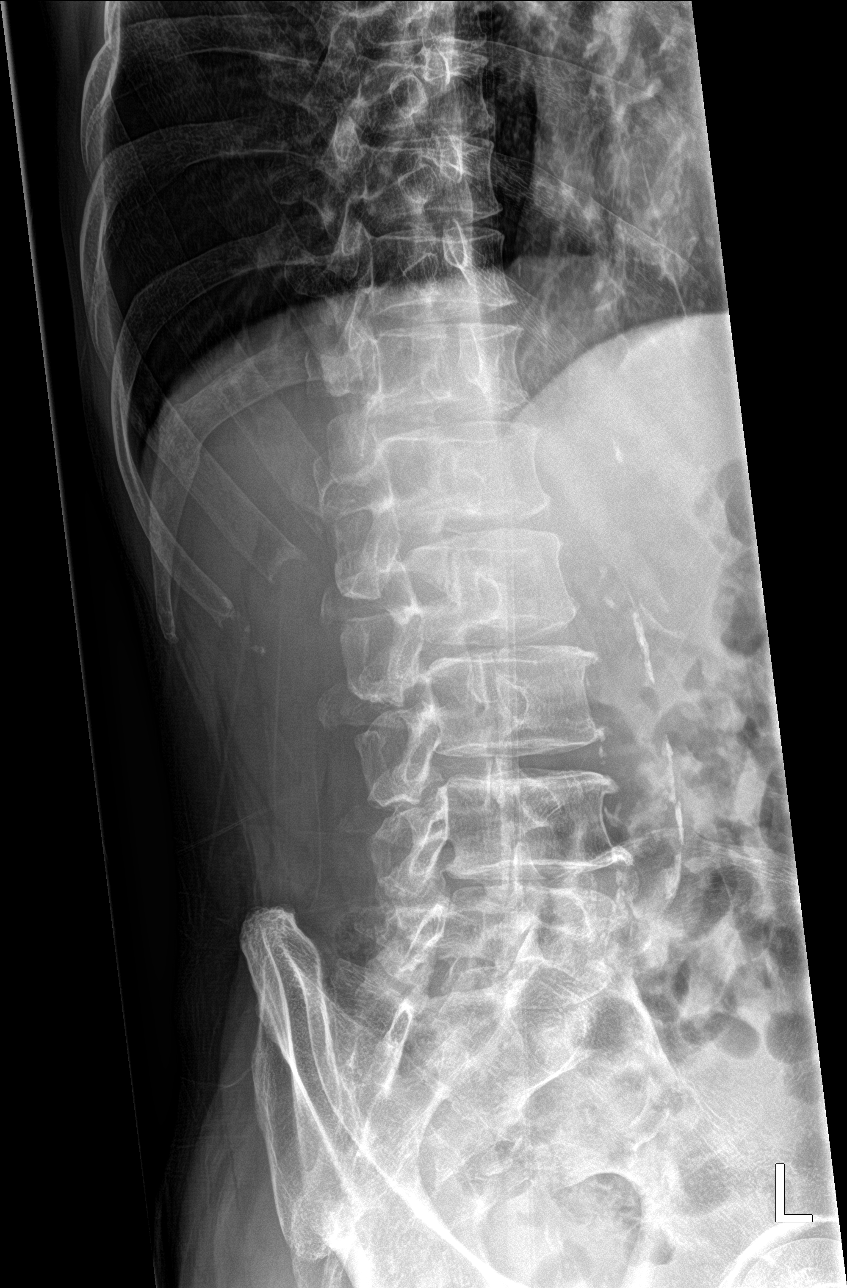

[l-spine lat]
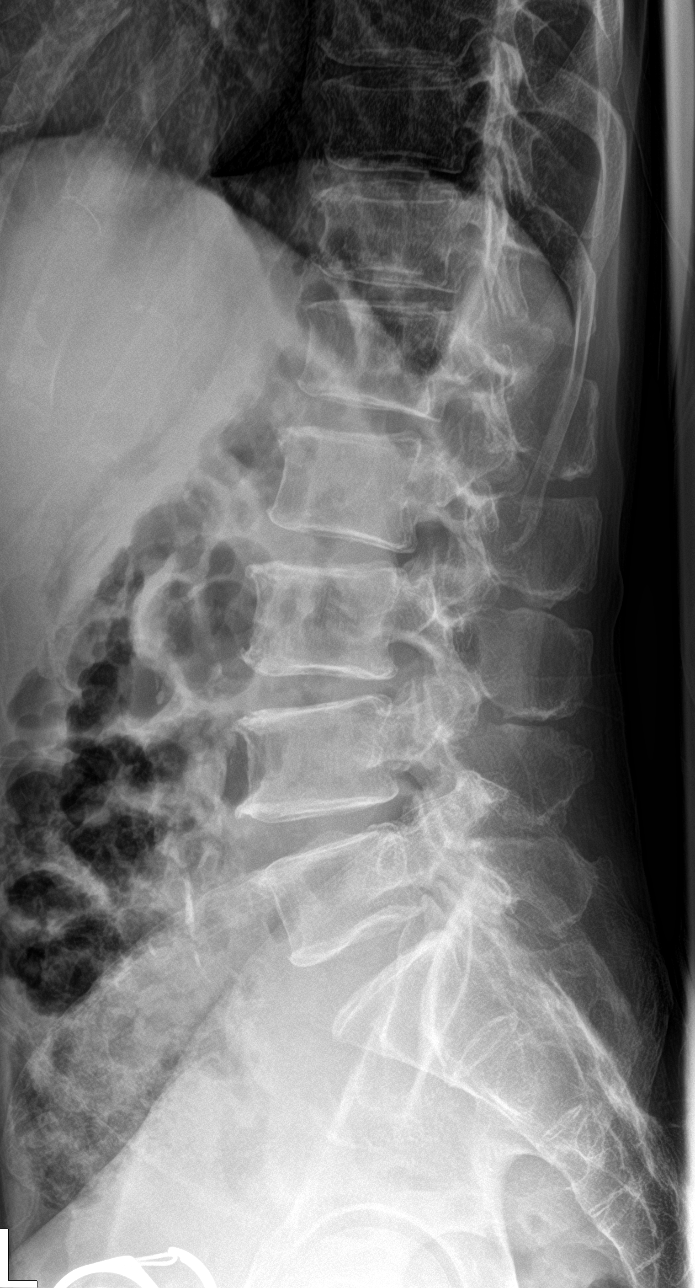

[l-spine spot]
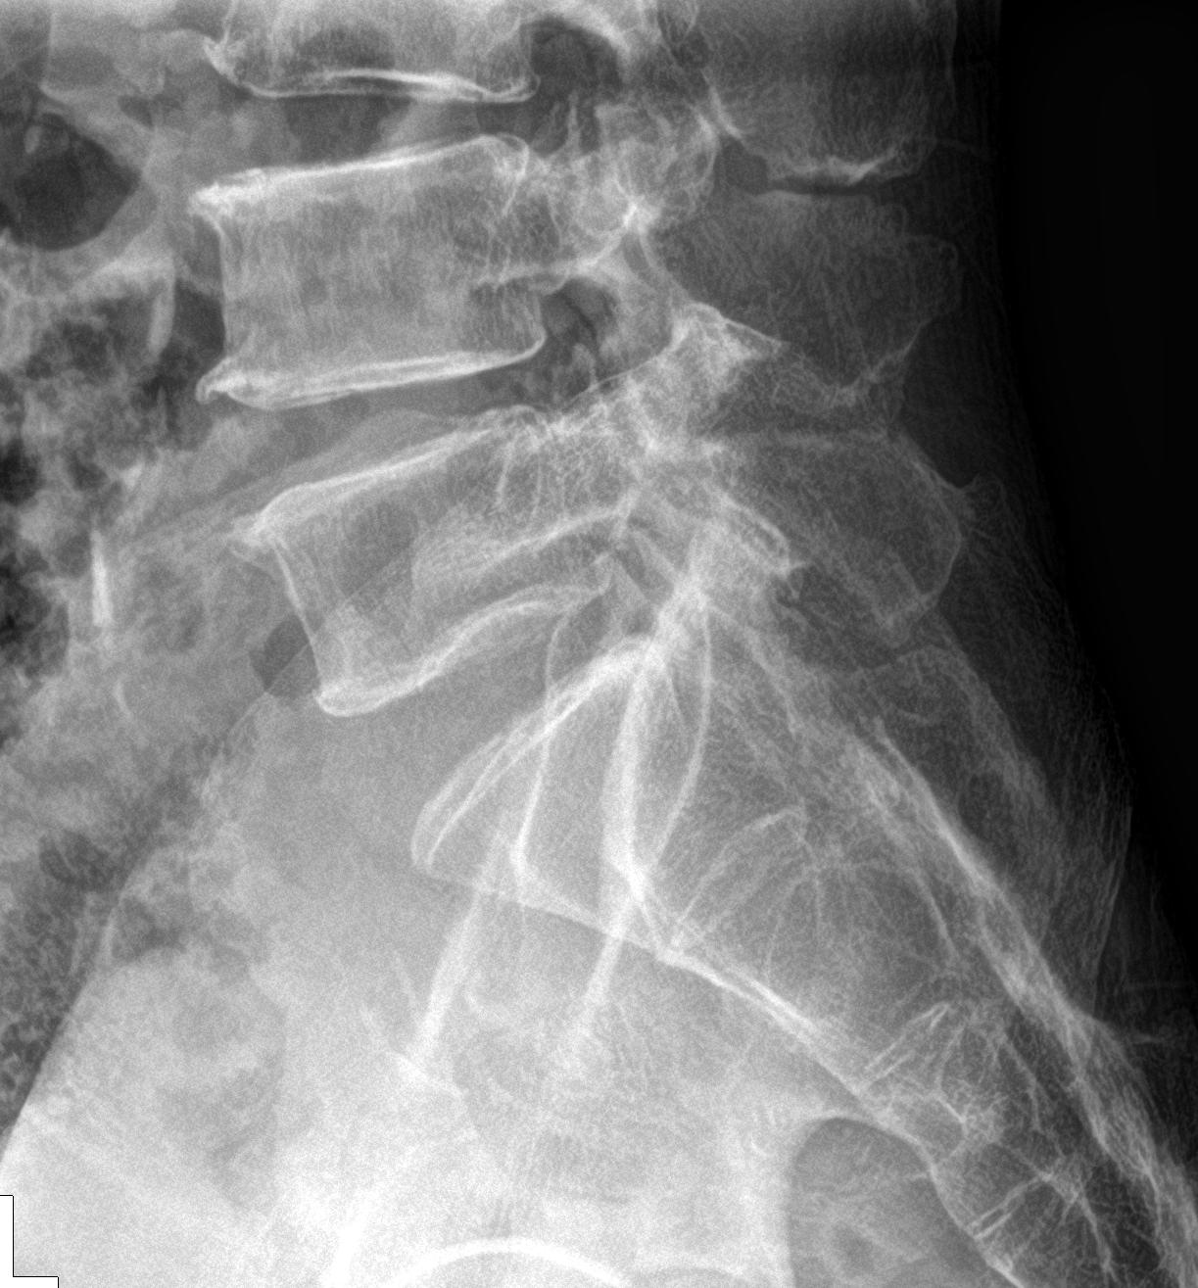

[5 of 5 positions shown; findings below may reference images not displayed]

FINDINGS: Frontal, bilateral oblique, lateral views of the lumbar spine are
obtained. There are 5 non-rib-bearing lumbar type vertebral bodies
in anatomic alignment. No acute displaced fractures. Mild diffuse
lumbar spondylosis and facet hypertrophy most pronounced at L3-4 and
L4-5. Sacroiliac joints are normal.
IMPRESSION: 1. Mild lumbar spondylosis.  No acute bony abnormality.

## 2022-01-11 ENCOUNTER — Encounter: Payer: Self-pay | Admitting: Internal Medicine

## 2022-01-11 NOTE — Progress Notes (Deleted)
Name: Travis Lutz  MRN/ DOB: [id number], 11-Feb-1956    Age/ Sex: 66 y.o., male    PCP: Marcine Matar, MD   Reason for Endocrinology Evaluation: Left adrenal adenoma     Date of Initial Endocrinology Evaluation: 01/11/2022     HPI: Travis Lutz is a 66 y.o. male with a past medical history of ***. The patient presented for initial endocrinology clinic visit on 01/11/2022 for consultative assistance with his left adrenal adenoma.   The patient had an incidental finding of 2.6 cm left adrenal adenoma on chest CT during lung cancer screening   In review of his lab work he was noted with normal normetanephrine's and metanephrines He did have a low cortisol but normal aldosterone and renin.    HISTORY:  Past Medical History:  Past Medical History:  Diagnosis Date   Essential hypertension 05/30/2021   Past Surgical History:  Past Surgical History:  Procedure Laterality Date   PUD repair     REPAIR OF PERFORATED ULCER      Social History:  reports that he has been smoking cigarettes. He has been smoking an average of .25 packs per day. He has never used smokeless tobacco. He reports current alcohol use. He reports current drug use. Drugs: Marijuana and Cocaine. Family History: family history includes Diabetes in his mother.   HOME MEDICATIONS: Allergies as of 01/12/2022   No Known Allergies      Medication List        Accurate as of Jan 11, 2022  2:18 PM. If you have any questions, ask your nurse or doctor.          acetaminophen 500 MG tablet Commonly known as: TYLENOL Take 1 tablet (500 mg total) by mouth every 6 (six) hours as needed.   amLODipine 5 MG tablet Commonly known as: NORVASC Take 1 tablet (5 mg total) by mouth daily.   atorvastatin 10 MG tablet Commonly known as: LIPITOR Take 1 tablet (10 mg total) by mouth daily.   dexamethasone 1 MG tablet Commonly known as: DECADRON Take 1 tablet (1 mg total) by mouth at bedtime.   nicotine 14  mg/24hr patch Commonly known as: NICODERM CQ - dosed in mg/24 hours Place 1 patch (14 mg total) onto the skin daily.   Proctozone-HC 2.5 % rectal cream Generic drug: hydrocortisone Apply to rectal area twice a day as needed   vitamin B-12 1000 MCG tablet Commonly known as: CYANOCOBALAMIN Take 1 tablet (1,000 mcg total) by mouth daily.          REVIEW OF SYSTEMS: A comprehensive ROS was conducted with the patient and is negative except as per HPI and below:  ROS     OBJECTIVE:  VS: There were no vitals taken for this visit.   Wt Readings from Last 3 Encounters:  05/30/21 120 lb 3.2 oz (54.5 kg)  05/05/21 118 lb 3.2 oz (53.6 kg)  04/11/21 130 lb (59 kg)     EXAM: General: Pt appears well and is in NAD  Neck: General: Supple without adenopathy. Thyroid: Thyroid size normal.  No goiter or nodules appreciated.   Lungs: Clear with good BS bilat with no rales, rhonchi, or wheezes  Heart: Auscultation: RRR.  Abdomen: Normoactive bowel sounds, soft, nontender, without masses or organomegaly palpable  Extremities:  BL LE: No pretibial edema normal ROM and strength.  Mental Status: Judgment, insight: Intact Orientation: Oriented to time, place, and person Mood and affect: No depression, anxiety,  or agitation     DATA REVIEWED: ***   CT chest 06/17/2021  Fluid density lesion in the left adrenal gland measures 2.6 cm. Visualized portions of the kidneys, spleen, pancreas, stomach and bowel are unremarkable. 9 mm left periaortic lymph node.   ASSESSMENT/PLAN/RECOMMENDATIONS:   Left adrenal adenoma    Medications :  Signed electronically by: Lyndle Herrlich, MD  [facility] Endocrinology  [facility] [address] [place] [place], [place] [address] Phone: [phone] FAX: [phone]   CC: Marcine Matar, MD [address] [place] [address] Phone: [phone] Fax: [phone]   Return to Endocrinology clinic  as below: Future Appointments  Date Time Provider Department Center  01/12/2022  8:10 AM Candie Gintz, Konrad Dolores, MD [facility] None

## 2022-01-12 ENCOUNTER — Ambulatory Visit: Payer: Self-pay | Admitting: Internal Medicine

## 2022-04-06 ENCOUNTER — Other Ambulatory Visit: Payer: Self-pay

## 2022-04-06 ENCOUNTER — Emergency Department (HOSPITAL_COMMUNITY)
Admission: EM | Admit: 2022-04-06 | Discharge: 2022-04-06 | Disposition: A | Discharge status: Home / Self Care | Payer: Self-pay | Attending: Emergency Medicine | Admitting: Emergency Medicine

## 2022-04-06 ENCOUNTER — Emergency Department (HOSPITAL_COMMUNITY): Payer: Self-pay

## 2022-04-06 DIAGNOSIS — G8929 Other chronic pain: Secondary | ICD-10-CM | POA: Insufficient documentation

## 2022-04-06 DIAGNOSIS — R509 Fever, unspecified: Secondary | ICD-10-CM | POA: Insufficient documentation

## 2022-04-06 DIAGNOSIS — D72829 Elevated white blood cell count, unspecified: Secondary | ICD-10-CM | POA: Insufficient documentation

## 2022-04-06 DIAGNOSIS — Z79899 Other long term (current) drug therapy: Secondary | ICD-10-CM | POA: Insufficient documentation

## 2022-04-06 DIAGNOSIS — I10 Essential (primary) hypertension: Secondary | ICD-10-CM | POA: Insufficient documentation

## 2022-04-06 DIAGNOSIS — M25561 Pain in right knee: Secondary | ICD-10-CM

## 2022-04-06 LAB — CBC WITH DIFFERENTIAL/PLATELET
Abs Immature Granulocytes: 0 10*3/uL (ref 0.00–0.07)
Basophils Absolute: 0 10*3/uL (ref 0.0–0.1)
Basophils Relative: 1 %
Eosinophils Absolute: 0.1 10*3/uL (ref 0.0–0.5)
Eosinophils Relative: 1 %
HCT: 40.3 % (ref 39.0–52.0)
Hemoglobin: 13.5 g/dL (ref 13.0–17.0)
Immature Granulocytes: 0 %
Lymphocytes Relative: 26 %
Lymphs Abs: 1.4 10*3/uL (ref 0.7–4.0)
MCH: 34.4 pg — ABNORMAL HIGH (ref 26.0–34.0)
MCHC: 33.5 g/dL (ref 30.0–36.0)
MCV: 102.8 fL — ABNORMAL HIGH (ref 80.0–100.0)
Monocytes Absolute: 0.8 10*3/uL (ref 0.1–1.0)
Monocytes Relative: 16 %
Neutro Abs: 2.9 10*3/uL (ref 1.7–7.7)
Neutrophils Relative %: 56 %
Platelets: 301 10*3/uL (ref 150–400)
RBC: 3.92 MIL/uL — ABNORMAL LOW (ref 4.22–5.81)
RDW: 14.3 % (ref 11.5–15.5)
WBC: 5.1 10*3/uL (ref 4.0–10.5)
nRBC: 0 % (ref 0.0–0.2)

## 2022-04-06 LAB — SYNOVIAL CELL COUNT + DIFF, W/ CRYSTALS
Crystals, Fluid: NONE SEEN
Eosinophils-Synovial: 0 % (ref 0–1)
Lymphocytes-Synovial Fld: 60 % — ABNORMAL HIGH (ref 0–20)
Monocyte-Macrophage-Synovial Fluid: 39 % — ABNORMAL LOW (ref 50–90)
Neutrophil, Synovial: 1 % (ref 0–25)
WBC, Synovial: 49 /mm3 (ref 0–200)

## 2022-04-06 LAB — BASIC METABOLIC PANEL
Anion gap: 9 (ref 5–15)
BUN: 19 mg/dL (ref 8–23)
CO2: 22 mmol/L (ref 22–32)
Calcium: 9 mg/dL (ref 8.9–10.3)
Chloride: 105 mmol/L (ref 98–111)
Creatinine, Ser: 0.9 mg/dL (ref 0.61–1.24)
GFR, Estimated: >60 mL/min (ref >60)
Glucose, Bld: 75 mg/dL (ref 70–99)
Potassium: 4.1 mmol/L (ref 3.5–5.1)
Sodium: 136 mmol/L (ref 135–145)

## 2022-04-06 LAB — C-REACTIVE PROTEIN: CRP: 0.9 mg/dL (ref <1.0)

## 2022-04-06 MED ORDER — OXYCODONE-ACETAMINOPHEN 5-325 MG PO TABS
1.0000 | ORAL_TABLET | Freq: Once | ORAL | Status: AC
  Administered 2022-04-06: 1 via ORAL
  Filled 2022-04-06: qty 1

## 2022-04-06 MED ORDER — IBUPROFEN 400 MG PO TABS
600.0000 mg | ORAL_TABLET | Freq: Once | ORAL | Status: AC
  Administered 2022-04-06: 600 mg via ORAL
  Filled 2022-04-06: qty 1

## 2022-04-06 MED ORDER — FENTANYL CITRATE PF 50 MCG/ML IJ SOSY
50.0000 ug | PREFILLED_SYRINGE | Freq: Once | INTRAMUSCULAR | Status: AC
  Administered 2022-04-06: 50 ug via INTRAVENOUS
  Filled 2022-04-06: qty 1

## 2022-04-06 MED ORDER — LIDOCAINE HCL (PF) 1 % IJ SOLN
5.0000 mL | Freq: Once | INTRAMUSCULAR | Status: AC
  Administered 2022-04-06: 5 mL
  Filled 2022-04-06: qty 5

## 2022-04-06 MED ORDER — IBUPROFEN 600 MG PO TABS: 600.0000 mg | ORAL_TABLET | Freq: Three times a day (TID) | ORAL | 0 refills | Status: DC | PRN

## 2022-04-06 NOTE — ED Notes (Signed)
Fluid Samples collected by Dr. Roslynn Amble and walked to lab by this RN

## 2022-04-06 NOTE — ED Provider Notes (Addendum)
[facility] EMERGENCY DEPARTMENT Provider Note   CSN: [id number] Arrival date & time: 04/06/22  3825     History  Chief Complaint  Patient presents with   Knee Pain    Travis Lutz is a 66 y.o. male with history of hypertension, alcohol use disorder, chronic lower back pain, bilateral chronic knee pain that presents with 9 days of right knee pain and swelling.  Patient states that he woke up with his symptoms and is unsure of what caused the pain.  He states that the pain does not radiate.  Patient states that the pain worsens with flexion, extension, and rotation of the right knee.  He states that this pain is different than his chronic knee pain and that he has never experienced this pain before.  The patient states that he was able to ambulate appropriately prior to the beginning of his symptoms but that he has been unable to put weight on his right leg due to the pain.  He states that he has been hopping on his left foot in order to ambulate.  The patient states that he has taken Tylenol at home for his pain without relief in his symptoms.  The patient is requesting a brace and/or cane to assist him with ambulating.  Patient is also complaining of 3 days of fever, nausea, generalized headache, lightheadedness.   Patient denies experiencing recent trauma or injuries.  Patient denies chills, abdominal pain, vomiting, diarrhea, dizziness, erythema, drainage.  The history is provided by the patient.  Knee Pain Associated symptoms: fever        Home Medications Prior to Admission medications   Medication Sig Start Date End Date Taking? Authorizing Provider  acetaminophen (TYLENOL) 500 MG tablet Take 1 tablet (500 mg total) by mouth every 6 (six) hours as needed. 11/28/19   Ladell Pier, MD  amLODipine (NORVASC) 5 MG tablet Take 1 tablet (5 mg total) by mouth daily. 05/30/21   Ladell Pier, MD  atorvastatin (LIPITOR) 10 MG tablet Take 1 tablet (10 mg  total) by mouth daily. 05/30/21   Ladell Pier, MD  dexamethasone (DECADRON) 1 MG tablet Take 1 tablet (1 mg total) by mouth at bedtime. 06/22/21   Ladell Pier, MD  hydrocortisone (ANUSOL-HC) 2.5 % rectal cream Apply to rectal area twice a day as needed 05/05/21   Ladell Pier, MD  nicotine (NICODERM CQ - DOSED IN MG/24 HOURS) 14 mg/24hr patch Place 1 patch (14 mg total) onto the skin daily. 05/30/21   Ladell Pier, MD  vitamin B-12 (CYANOCOBALAMIN) 1000 MCG tablet Take 1 tablet (1,000 mcg total) by mouth daily. 06/01/21   Ladell Pier, MD      Allergies    Patient has no known allergies.    Review of Systems   Review of Systems  Constitutional:  Positive for fever. Negative for chills.  Gastrointestinal:  Positive for nausea. Negative for abdominal pain, diarrhea and vomiting.  Musculoskeletal:        Endorses right knee pain and swelling.   Skin:        Denies erythema or drainage of the right knee.   Neurological:  Positive for light-headedness and headaches. Negative for dizziness.    Physical Exam Updated Vital Signs BP 139/86 (BP Location: Right Arm)   Pulse 86   Temp 98.6 F (37 C) (Oral)   Resp 17   SpO2 96%  Physical Exam Constitutional:      General: He  is not in acute distress. Cardiovascular:     Rate and Rhythm: Normal rate and regular rhythm.     Pulses: Normal pulses.     Heart sounds: No murmur heard.    No friction rub. No gallop.  Pulmonary:     Effort: Pulmonary effort is normal.     Breath sounds: No wheezing, rhonchi or rales.  Musculoskeletal:     Comments: Tenderness to palpation of the medial, lateral, anterior, and posterior right knee. Pain with attempted flexion and extension of the right knee. Mild effusion of the right knee. Negative anterior drawer test. Negative posterior drawer test.   Skin:    General: Skin is warm and dry.     Comments: No abrasions, lacerations, erythema, warmth, or drainage of the right knee.    Neurological:     Mental Status: He is alert.     ED Results / Procedures / Treatments   Labs (all labs ordered are listed, but only abnormal results are displayed) Labs Reviewed - No data to display  EKG None  Radiology No results found.  Procedures .Joint Aspiration/Arthrocentesis  Date/Time: 04/06/2022 11:17 AM  Performed by: Golden Gilreath, MD Authorized by: Lucrezia Starch, MD   Consent:    Consent obtained:  Verbal   Consent given by:  Patient   Risks, benefits, and alternatives were discussed: yes     Risks discussed:  Bleeding, infection and pain   Alternatives discussed:  No treatment, delayed treatment and observation Universal protocol:    Procedure explained and questions answered to patient or proxy's satisfaction: yes     Relevant documents present and verified: yes     Site/side marked: yes     Immediately prior to procedure, a time out was called: yes     Patient identity confirmed:  Verbally with patient and arm band Location:    Location:  Knee   Knee:  R knee Anesthesia:    Anesthesia method:  Local infiltration   Local anesthetic:  Lidocaine 1% w/o epi Procedure details:    Preparation: Patient was prepped and draped in usual sterile fashion     Needle gauge:  39 G   Ultrasound guidance: no     Approach:  Lateral   Aspirate characteristics:  Yellow   Steroid injected: no     Specimen collected: yes   Post-procedure details:    Dressing:  Adhesive bandage   Procedure completion:  Tolerated well, no immediate complications     Medications Ordered in ED Medications  ibuprofen (ADVIL) tablet 600 mg (600 mg Oral Given 04/06/22 0933)    ED Course/ Medical Decision Making/ A&P                           Medical Decision Making Juma A Treat is a 66 y.o. male with history of hypertension, alcohol use disorder, chronic lower back pain, and bilateral chronic knee pain that presents with 9 days of right knee pain, swelling, and difficulty  ambulating.  Differential diagnosis includes but is not limited to septic arthritis, gout, osteoarthritis.  Will order x-ray of the right knee to assess for acute abnormalities. Will order CBC and BMP to assess cell counts, electrolytes, renal function.  Will order CRP to assess for signs of inflammation.  Will order ibuprofen and fentanyl and reassess the patient. Patient discussed with Dr. Roslynn Amble.    Amount and/or Complexity of Data Reviewed Labs: ordered.  Risk Prescription drug management.  10:53 AM X-ray of the patient's right knee demonstrates small to moderate-sized effusion as well as mild degenerative changes worse on the medial compartment.  We will perform arthrocentesis to assess for potential septic arthritis or gout.  Still awaiting results of CBC, BMP, CRP.  11:29 AM The patient's BMP is unremarkable.  The patient's CBC is unremarkable with the exception of mildly decreased RBC at 3.92.  The patient's CRP is WNL.  The patient tolerated his arthrocentesis well.  The aspirated fluid was sent to the lab for analysis.  Awaiting results of synovial fluid cell count w/ differential + crystals and culture w/ gram stain.  Patient is still in pain.  Will order Percocet.    1:14 PM Patient is resting comfortably and states that his pain has improved. Still awaiting results of synovial fluid cell count w/ differential + crystals and culture w/ gram stain.  2:16 PM The patient's synovial fluid analysis is negative for crystals or white blood cells.  The fluid is yellow and clear.  Still pending results of the gram stain. My suspicion for septic arthritis or gout is low.  I updated the patient on the results of his labs and imaging. The patient is feeling better and would like to go home prior to his gram stain resulting. Discussed with patient plan to prescribe knee brace, crutches, and ibuprofen. Discussed with patient plan to follow-up with his primary care doctor for his symptoms as well  as Ortho.  Discussed with patient plan to return to the emergency department he develops new or worsening symptoms including but not limited to fever, uncontrollable pain, inability to ambulate, swelling, erythema, drainage.  Patient agrees with the plan.        Final Clinical Impression(s) / ED Diagnoses Final diagnoses:  None    Rx / DC Orders ED Discharge Orders     None         Consuelo Suthers, Claudia Desanctis, MD 04/06/22 1421    Starlyn Skeans, MD 04/06/22 1423    Lucrezia Starch, MD 04/07/22 [phone]

## 2022-04-06 NOTE — Discharge Instructions (Addendum)
Follow-up with your primary care doctor and orthopedic doctor regarding your knee pain.  Recommend taking Tylenol and Motrin as needed for pain control.  Can try the prescription strength Motrin.  If you develop increasing pain, swelling, redness, come back to ER for reassessment.

## 2022-04-06 NOTE — ED Triage Notes (Signed)
Pt reports pain, swelling in right knee. Per pt, it has swollen for 9 days. Pt denies any trauma or recent falls.

## 2022-04-06 NOTE — ED Provider Triage Note (Signed)
Emergency Medicine Provider Triage Evaluation Note  JUN OSMENT , a 66 y.o. male  was evaluated in triage.  Pt complains of right knee pain x9 days. Patient denies any trauma, event to account for pain. Patient states he plays basketball every Sunday however no obvious event to account for pain. Patient NV intact throughout right leg.   Review of Systems  Positive:  Negative:   Physical Exam  BP 139/86 (BP Location: Right Arm)   Pulse 86   Temp 98.6 F (37 C) (Oral)   Resp 17   SpO2 96%  Gen:   Awake, no distress   Resp:  Normal effort  MSK:   Moves extremities without difficulty  Other:  Decreased flexion 2/2 to pain  Medical Decision Making  Medically screening exam initiated at 9:26 AM.  Appropriate orders placed.  Nellie A Linford was informed that the remainder of the evaluation will be completed by another provider, this initial triage assessment does not replace that evaluation, and the importance of remaining in the ED until their evaluation is complete.  Work up initiated   Azucena Cecil, [place] 04/06/22 [phone]

## 2022-04-09 LAB — BODY FLUID CULTURE W GRAM STAIN: Culture: NO GROWTH

## 2023-02-02 ENCOUNTER — Encounter (HOSPITAL_COMMUNITY): Payer: Self-pay

## 2023-02-02 ENCOUNTER — Other Ambulatory Visit: Payer: Self-pay

## 2023-02-02 ENCOUNTER — Emergency Department (HOSPITAL_COMMUNITY)
Admission: EM | Admit: 2023-02-02 | Discharge: 2023-02-02 | Disposition: A | Discharge status: Home / Self Care | Payer: Medicare Other | Attending: Emergency Medicine | Admitting: Emergency Medicine

## 2023-02-02 DIAGNOSIS — L03011 Cellulitis of right finger: Secondary | ICD-10-CM | POA: Insufficient documentation

## 2023-02-02 DIAGNOSIS — Z23 Encounter for immunization: Secondary | ICD-10-CM | POA: Insufficient documentation

## 2023-02-02 DIAGNOSIS — Z79899 Other long term (current) drug therapy: Secondary | ICD-10-CM | POA: Insufficient documentation

## 2023-02-02 DIAGNOSIS — R7309 Other abnormal glucose: Secondary | ICD-10-CM | POA: Diagnosis not present

## 2023-02-02 DIAGNOSIS — R2233 Localized swelling, mass and lump, upper limb, bilateral: Secondary | ICD-10-CM | POA: Diagnosis present

## 2023-02-02 LAB — CBG MONITORING, ED: Glucose-Capillary: 112 mg/dL — ABNORMAL HIGH (ref 70–99)

## 2023-02-02 MED ORDER — ONDANSETRON 4 MG PO TBDP
8.0000 mg | ORAL_TABLET | Freq: Once | ORAL | Status: AC
  Administered 2023-02-02: 8 mg via ORAL
  Filled 2023-02-02: qty 2

## 2023-02-02 MED ORDER — LIDOCAINE HCL (PF) 1 % IJ SOLN
30.0000 mL | Freq: Once | INTRAMUSCULAR | Status: AC
  Administered 2023-02-02: 30 mL
  Filled 2023-02-02: qty 30

## 2023-02-02 MED ORDER — HYDROCODONE-ACETAMINOPHEN 5-325 MG PO TABS
1.0000 | ORAL_TABLET | Freq: Once | ORAL | Status: AC
  Administered 2023-02-02: 1 via ORAL
  Filled 2023-02-02: qty 1

## 2023-02-02 MED ORDER — DOXYCYCLINE HYCLATE 100 MG PO CAPS
100.0000 mg | ORAL_CAPSULE | Freq: Two times a day (BID) | ORAL | 0 refills | Status: DC
  Filled 2023-02-02: qty 20, 10d supply, fill #0

## 2023-02-02 MED ORDER — DOXYCYCLINE HYCLATE 100 MG PO CAPS: 100.0000 mg | ORAL_CAPSULE | Freq: Two times a day (BID) | ORAL | 0 refills | Status: DC

## 2023-02-02 MED ORDER — TETANUS-DIPHTH-ACELL PERTUSSIS 5-2.5-18.5 LF-MCG/0.5 IM SUSY
0.5000 mL | PREFILLED_SYRINGE | Freq: Once | INTRAMUSCULAR | Status: AC
  Administered 2023-02-02: 0.5 mL via INTRAMUSCULAR
  Filled 2023-02-02: qty 0.5

## 2023-02-02 NOTE — ED Triage Notes (Addendum)
Pt came in via POV d/t bil thumb swelling around the nail bed for approx. the last week, denies fevers or drainage, dose endorse odor. Hx of DM denies any other significant medical Hx or recent injuries. A/Ox4,

## 2023-02-02 NOTE — Discharge Instructions (Addendum)
Your paronychia or infection at the base of the nailbed on the right thumb was drained today.  I have sent antibiotic into the pharmacy for you.  If you have any concerning symptoms return to the emergency room.  Take Tylenol and ibuprofen as you need to for pain control.

## 2023-02-02 NOTE — ED Provider Notes (Signed)
[place] EMERGENCY DEPARTMENT AT [facility] Provider Note   CSN: [id number] Arrival date & time: 02/02/23  4098     History  Chief Complaint  Patient presents with   Bilateral Thumb Swelling    Travis Lutz is a 67 y.o. male.  67 year old male presents today for evaluation of paronychia to right.  States it has been ongoing for a week.  Reports significant pain to the point he cannot put his right hand into his pocket.  Has not taken anything for pain prior to arrival.  Denies fever, or drainage.  No additional complaints.  The history is provided by the patient. No language interpreter was used.       Home Medications Prior to Admission medications   Medication Sig Start Date End Date Taking? Authorizing Provider  acetaminophen (TYLENOL) 500 MG tablet Take 1 tablet (500 mg total) by mouth every 6 (six) hours as needed. 11/28/19   Marcine Matar, MD  amLODipine (NORVASC) 5 MG tablet Take 1 tablet (5 mg total) by mouth daily. 05/30/21   Marcine Matar, MD  atorvastatin (LIPITOR) 10 MG tablet Take 1 tablet (10 mg total) by mouth daily. 05/30/21   Marcine Matar, MD  dexamethasone (DECADRON) 1 MG tablet Take 1 tablet (1 mg total) by mouth at bedtime. 06/22/21   Marcine Matar, MD  hydrocortisone (ANUSOL-HC) 2.5 % rectal cream Apply to rectal area twice a day as needed 05/05/21   Marcine Matar, MD  ibuprofen (ADVIL) 600 MG tablet Take 1 tablet (600 mg total) by mouth every 8 (eight) hours as needed. 04/06/22   Milagros Loll, MD  nicotine (NICODERM CQ - DOSED IN MG/24 HOURS) 14 mg/24hr patch Place 1 patch (14 mg total) onto the skin daily. 05/30/21   Marcine Matar, MD  vitamin B-12 (CYANOCOBALAMIN) 1000 MCG tablet Take 1 tablet (1,000 mcg total) by mouth daily. 06/01/21   Marcine Matar, MD      Allergies    Patient has no known allergies.    Review of Systems   Review of Systems  Constitutional:  Negative for fever.  Skin:  Positive  for wound (paronychia).  All other systems reviewed and are negative.   Physical Exam Updated Vital Signs BP (!) 148/93 (BP Location: Right Arm)   Pulse 87   Temp 98.2 F (36.8 C) (Oral)   Resp 16   SpO2 98%  Physical Exam Vitals and nursing note reviewed.  Constitutional:      General: He is not in acute distress.    Appearance: Normal appearance. He is not ill-appearing.  HENT:     Head: Normocephalic and atraumatic.     Nose: Nose normal.  Eyes:     Conjunctiva/sclera: Conjunctivae normal.  Cardiovascular:     Rate and Rhythm: Normal rate.  Pulmonary:     Effort: Pulmonary effort is normal. No respiratory distress.  Musculoskeletal:        General: No deformity.     Comments: Paronychia noted to base of nailbed of the right thumb.  No drainage.  Exquisitely tender.  Skin:    Findings: No rash.  Neurological:     Mental Status: He is alert.     ED Results / Procedures / Treatments   Labs (all labs ordered are listed, but only abnormal results are displayed) Labs Reviewed  CBG MONITORING, ED    EKG None  Radiology No results found.  Procedures Drain paronychia  Date/Time: 02/02/2023 10:08  AM  Performed by: Marita Kansas, PA-C Authorized by: Marita Kansas, PA-C  Consent: Verbal consent obtained. Risks and benefits: risks, benefits and alternatives were discussed Consent given by: patient Patient understanding: patient states understanding of the procedure being performed Patient consent: the patient's understanding of the procedure matches consent given Procedure consent: procedure consent matches procedure scheduled Patient identity confirmed: verbally with patient and arm band Preparation: Patient was prepped and draped in the usual sterile fashion. Local anesthesia used: yes Anesthesia: digital block  Anesthesia: Local anesthesia used: yes Local Anesthetic: lidocaine 1% without epinephrine Anesthetic total: 2 mL Patient tolerance: patient tolerated  the procedure well with no immediate complications       Medications Ordered in ED Medications  lidocaine (PF) (XYLOCAINE) 1 % injection 30 mL (has no administration in time range)  Tdap (BOOSTRIX) injection 0.5 mL (has no administration in time range)  ondansetron (ZOFRAN-ODT) disintegrating tablet 8 mg (has no administration in time range)  HYDROcodone-acetaminophen (NORCO/VICODIN) 5-325 MG per tablet 1 tablet (has no administration in time range)    ED Course/ Medical Decision Making/ A&P                             Medical Decision Making Risk Prescription drug management.   67 year old male presents for concern of paronychia to right thumb.  Ongoing for about a week.  No fever, drainage.  Without erythema.  Full range of motion.  CBG obtained at patient's request which was 112.  Paronychia drained.  Antibiotic prescribed.  Symptomatic management discussed.  Nonadherent dressing applied.  Patient is appropriate for discharge.  Discharged in stable condition.  Return precautions discussed.   Final Clinical Impression(s) / ED Diagnoses Final diagnoses:  Paronychia of thumb, right    Rx / DC Orders ED Discharge Orders          Ordered    doxycycline (VIBRAMYCIN) 100 MG capsule  2 times daily,   Status:  Discontinued        02/02/23 1009    doxycycline (VIBRAMYCIN) 100 MG capsule  2 times daily        02/02/23 1010              Marita Kansas, [place] 02/02/23 1010    Glynn Octave, MD 02/02/23 1701

## 2023-12-11 DIAGNOSIS — R972 Elevated prostate specific antigen [PSA]: Secondary | ICD-10-CM

## 2023-12-11 HISTORY — DX: Elevated prostate specific antigen (PSA): R97.20

## 2023-12-19 ENCOUNTER — Emergency Department (HOSPITAL_COMMUNITY)

## 2023-12-19 ENCOUNTER — Emergency Department (HOSPITAL_COMMUNITY)
Admission: EM | Admit: 2023-12-19 | Discharge: 2023-12-19 | Disposition: A | Discharge status: Home / Self Care | Attending: Emergency Medicine | Admitting: Emergency Medicine

## 2023-12-19 ENCOUNTER — Other Ambulatory Visit: Payer: Self-pay

## 2023-12-19 ENCOUNTER — Encounter (HOSPITAL_COMMUNITY): Payer: Self-pay | Admitting: *Deleted

## 2023-12-19 DIAGNOSIS — I10 Essential (primary) hypertension: Secondary | ICD-10-CM | POA: Diagnosis not present

## 2023-12-19 DIAGNOSIS — W228XXA Striking against or struck by other objects, initial encounter: Secondary | ICD-10-CM | POA: Diagnosis not present

## 2023-12-19 DIAGNOSIS — S0502XA Injury of conjunctiva and corneal abrasion without foreign body, left eye, initial encounter: Secondary | ICD-10-CM | POA: Insufficient documentation

## 2023-12-19 DIAGNOSIS — H40053 Ocular hypertension, bilateral: Secondary | ICD-10-CM | POA: Insufficient documentation

## 2023-12-19 DIAGNOSIS — R030 Elevated blood-pressure reading, without diagnosis of hypertension: Secondary | ICD-10-CM | POA: Insufficient documentation

## 2023-12-19 DIAGNOSIS — S0590XA Unspecified injury of unspecified eye and orbit, initial encounter: Secondary | ICD-10-CM | POA: Diagnosis not present

## 2023-12-19 DIAGNOSIS — T1592XA Foreign body on external eye, part unspecified, left eye, initial encounter: Secondary | ICD-10-CM | POA: Diagnosis not present

## 2023-12-19 DIAGNOSIS — Z79899 Other long term (current) drug therapy: Secondary | ICD-10-CM | POA: Diagnosis not present

## 2023-12-19 DIAGNOSIS — H539 Unspecified visual disturbance: Secondary | ICD-10-CM | POA: Diagnosis not present

## 2023-12-19 DIAGNOSIS — R220 Localized swelling, mass and lump, head: Secondary | ICD-10-CM | POA: Diagnosis not present

## 2023-12-19 LAB — CBC WITH DIFFERENTIAL/PLATELET
Abs Immature Granulocytes: 0.01 10*3/uL (ref 0.00–0.07)
Basophils Absolute: 0 10*3/uL (ref 0.0–0.1)
Basophils Relative: 0 %
Eosinophils Absolute: 0 10*3/uL (ref 0.0–0.5)
Eosinophils Relative: 0 %
HCT: 46.9 % (ref 39.0–52.0)
Hemoglobin: 15.8 g/dL (ref 13.0–17.0)
Immature Granulocytes: 0 %
Lymphocytes Relative: 25 %
Lymphs Abs: 1.2 10*3/uL (ref 0.7–4.0)
MCH: 35.2 pg — ABNORMAL HIGH (ref 26.0–34.0)
MCHC: 33.7 g/dL (ref 30.0–36.0)
MCV: 104.5 fL — ABNORMAL HIGH (ref 80.0–100.0)
Monocytes Absolute: 0.6 10*3/uL (ref 0.1–1.0)
Monocytes Relative: 12 %
Neutro Abs: 3 10*3/uL (ref 1.7–7.7)
Neutrophils Relative %: 63 %
Platelets: 320 10*3/uL (ref 150–400)
RBC: 4.49 MIL/uL (ref 4.22–5.81)
RDW: 14.3 % (ref 11.5–15.5)
WBC: 4.8 10*3/uL (ref 4.0–10.5)
nRBC: 0 % (ref 0.0–0.2)

## 2023-12-19 LAB — COMPREHENSIVE METABOLIC PANEL WITH GFR
ALT: 18 U/L (ref 0–44)
AST: 27 U/L (ref 15–41)
Albumin: 4.1 g/dL (ref 3.5–5.0)
Alkaline Phosphatase: 73 U/L (ref 38–126)
Anion gap: 12 (ref 5–15)
BUN: 14 mg/dL (ref 8–23)
CO2: 24 mmol/L (ref 22–32)
Calcium: 9.2 mg/dL (ref 8.9–10.3)
Chloride: 102 mmol/L (ref 98–111)
Creatinine, Ser: 0.86 mg/dL (ref 0.61–1.24)
GFR, Estimated: >60 mL/min (ref >60)
Glucose, Bld: 99 mg/dL (ref 70–99)
Potassium: 3.9 mmol/L (ref 3.5–5.1)
Sodium: 138 mmol/L (ref 135–145)
Total Bilirubin: 1.1 mg/dL (ref 0.0–1.2)
Total Protein: 8 g/dL (ref 6.5–8.1)

## 2023-12-19 MED ORDER — FLUORESCEIN SODIUM 1 MG OP STRP
1.0000 | ORAL_STRIP | Freq: Once | OPHTHALMIC | Status: AC
  Administered 2023-12-19: 1 via OPHTHALMIC
  Filled 2023-12-19: qty 1

## 2023-12-19 MED ORDER — ONDANSETRON HCL 4 MG/2ML IJ SOLN
4.0000 mg | Freq: Once | INTRAMUSCULAR | Status: AC
  Administered 2023-12-19: 4 mg via INTRAVENOUS
  Filled 2023-12-19: qty 2

## 2023-12-19 MED ORDER — FENTANYL CITRATE PF 50 MCG/ML IJ SOSY
50.0000 ug | PREFILLED_SYRINGE | Freq: Once | INTRAMUSCULAR | Status: AC
  Administered 2023-12-19: 50 ug via INTRAVENOUS
  Filled 2023-12-19: qty 1

## 2023-12-19 MED ORDER — BACITRACIN-POLYMYXIN B 500-10000 UNIT/GM OP OINT: TOPICAL_OINTMENT | OPHTHALMIC | 1 refills | Status: DC

## 2023-12-19 MED ORDER — OXYCODONE-ACETAMINOPHEN 5-325 MG PO TABS
1.0000 | ORAL_TABLET | Freq: Once | ORAL | Status: AC
  Administered 2023-12-19: 1 via ORAL
  Filled 2023-12-19: qty 1

## 2023-12-19 MED ORDER — TETRACAINE HCL 0.5 % OP SOLN
2.0000 [drp] | Freq: Once | OPHTHALMIC | Status: AC
  Administered 2023-12-19: 2 [drp] via OPHTHALMIC
  Filled 2023-12-19: qty 4

## 2023-12-19 MED ORDER — HYDROMORPHONE HCL 1 MG/ML IJ SOLN
0.5000 mg | Freq: Once | INTRAMUSCULAR | Status: AC
  Administered 2023-12-19: 0.5 mg via INTRAVENOUS
  Filled 2023-12-19: qty 1

## 2023-12-19 MED ORDER — OXYCODONE HCL 5 MG PO TABS: 2.5000 mg | ORAL_TABLET | Freq: Four times a day (QID) | ORAL | 0 refills | Status: DC | PRN

## 2023-12-19 NOTE — Discharge Instructions (Signed)
 1) you have a corneal abrasion of the left eye.  I am discharging you with some pain medication and treatment for the eye.  It is very important that you take the medications I prescribed as directed 2) I have also diagnosed you with elevated eye pressures.  It is very likely that you have a diagnosis of a disorder called glaucoma.  It is imperative that you follow-up with the eye doctor because untreated glaucoma can lead to permanent blindness.  Please call him tomorrow and make an appointment to be seen in the office for both the scratch on your eye and for the eye pressure issue.

## 2023-12-19 NOTE — ED Provider Notes (Signed)
 [place] [facility] DE[facility]MENT AT [facility] Provider Note   CSN: [id number] Arrival date & time: 12/19/23  1309     History  Chief Complaint  Patient presents with   Eye Injury    Travis Lutz is a 68 y.o. male.  Who is emergency department with chief complaint of eye trauma.  Patient was working on a car yesterday when he had a bungee cord snapped back and hit him in his left eye.  Since that time he has had progressively worsening pain, photophobia, redness, swelling in the left eye.  He denies pain with consensual photo contraction. He states that he can see light and shapes but his left eye is blurry.  He does not wear contact lenses or glasses.  Rates his pain at 10 out of 10.   Eye Injury       Home Medications Prior to Admission medications   Medication Sig Start Date End Date Taking? Authorizing Provider  acetaminophen (TYLENOL) 500 MG tablet Take 1 tablet (500 mg total) by mouth every 6 (six) hours as needed. 11/28/19   Lawrance Presume, MD  amLODipine (NORVASC) 5 MG tablet Take 1 tablet (5 mg total) by mouth daily. 05/30/21   Lawrance Presume, MD  atorvastatin (LIPITOR) 10 MG tablet Take 1 tablet (10 mg total) by mouth daily. 05/30/21   Lawrance Presume, MD  dexamethasone (DECADRON) 1 MG tablet Take 1 tablet (1 mg total) by mouth at bedtime. 06/22/21   Lawrance Presume, MD  doxycycline (VIBRAMYCIN) 100 MG capsule Take 1 capsule (100 mg total) by mouth 2 (two) times daily. 02/02/23   Lucina Sabal, PA-C  hydrocortisone (ANUSOL-HC) 2.5 % rectal cream Apply to rectal area twice a day as needed 05/05/21   Lawrance Presume, MD  ibuprofen (ADVIL) 600 MG tablet Take 1 tablet (600 mg total) by mouth every 8 (eight) hours as needed. 04/06/22   Camellia Caves, MD  nicotine (NICODERM CQ - DOSED IN MG/24 HOURS) 14 mg/24hr patch Place 1 patch (14 mg total) onto the skin daily. 05/30/21   Lawrance Presume, MD  vitamin B-12 (CYANOCOBALAMIN) 1000 MCG tablet Take 1  tablet (1,000 mcg total) by mouth daily. 06/01/21   Lawrance Presume, MD      Allergies    Patient has no known allergies.    Review of Systems   Review of Systems  Physical Exam Updated Vital Signs BP (!) 169/108   Pulse 76   Temp 98.2 F (36.8 C)   Resp 18   SpO2 100%  Physical Exam Vitals and nursing note reviewed.  Constitutional:      General: He is not in acute distress.    Appearance: He is well-developed. He is not diaphoretic.  HENT:     Head: Normocephalic.  Eyes:     General: No scleral icterus.    Intraocular pressure: Right eye pressure is 47 mmHg. Left eye pressure is 43 mmHg. Measurements were taken using a handheld tonometer.    Extraocular Movements: Extraocular movements intact.     Pupils: Pupils are equal, round, and reactive to light.     Comments: Left eye is somewhat proptotic with also swelling of the left eyelid.  There is a subconjunctival hematoma noted on the medial aspect of his left eye.  No obvious pupillary deformity, negative Sidel sign.  There is fluorescein uptake at about 6:00 on the cornea consistent with abrasion.  Cardiovascular:     Rate and Rhythm:  Normal rate and regular rhythm.     Heart sounds: Normal heart sounds.  Pulmonary:     Effort: Pulmonary effort is normal. No respiratory distress.     Breath sounds: Normal breath sounds.  Abdominal:     Palpations: Abdomen is soft.     Tenderness: There is no abdominal tenderness.  Musculoskeletal:     Cervical back: Normal range of motion and neck supple.  Skin:    General: Skin is warm and dry.  Neurological:     Mental Status: He is alert.  Psychiatric:        Behavior: Behavior normal.     ED Results / Procedures / Treatments   Labs (all labs ordered are listed, but only abnormal results are displayed) Labs Reviewed  CBC WITH DIFFERENTIAL/PLATELET - Abnormal; Notable for the following components:      Result Value   MCV 104.5 (*)    MCH 35.2 (*)    All other  components within normal limits  COMPREHENSIVE METABOLIC PANEL WITH GFR    EKG None  Radiology No results found.  Procedures Procedures    Medications Ordered in ED Medications  fluorescein ophthalmic strip 1 strip (1 strip Left Eye Given 12/19/23 1534)  tetracaine (PONTOCAINE) 0.5 % ophthalmic solution 2 drop (2 drops Left Eye Given 12/19/23 1534)  oxyCODONE-acetaminophen (PERCOCET/ROXICET) 5-325 MG per tablet 1 tablet (1 tablet Oral Given 12/19/23 1447)  fentaNYL (SUBLIMAZE) injection 50 mcg (50 mcg Intravenous Given 12/19/23 1534)  ondansetron (ZOFRAN) injection 4 mg (4 mg Intravenous Given 12/19/23 1533)    ED Course/ Medical Decision Making/ A&P Clinical Course as of 12/19/23 2324  Wed Dec 19, 2023  2039 Case discussed with Dr. Grissom. 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 [AH]    Clinical Course User Index [AH] Tama Fails, PA-C                                 Medical Decision Making Amount and/or Complexity of Data Reviewed Radiology: ordered.  Risk Prescription drug management.   Patient here with EYE injury. NO evidence of globe rupture + for Increased BL IOP and corneal abrasion Bacitracin ointment and pain meds with STRICT OP f/u and return precautions. I personally visualized and interpreted the images using our PACS system. Acute findings include:  No signs of retrobulbar hematoma or globe rupture  PDMP reviewed during this encounter.          Final Clinical Impression(s) / ED Diagnoses Final diagnoses:  Abrasion of left cornea, initial encounter  Elevated IOP, bilateral  Elevated blood pressure reading    Rx / DC Orders ED Discharge Orders     None         Tama Fails, PA-C 12/19/23 2329    Afton Horse T, DO 12/27/23 0720
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
## 2023-12-19 NOTE — ED Provider Notes (Signed)
 Patient presents to clinic with concern over retained FB in his left eye. He was doing some car work yesterday when some metal piece from a bumper came off and hit him in the left eye. Endorses left eye vision changes, severe pain and swelling.   Advised further evaluation at the nearest ED for further emergent evaluation of vision loss / retained FB. Agreeable to plan, transport via POV.    Travis Lutz, [place], [place] 12/19/23 1258

## 2023-12-19 NOTE — ED Notes (Signed)
 Patient transported to CT

## 2023-12-22 ENCOUNTER — Encounter (HOSPITAL_COMMUNITY): Payer: Self-pay

## 2023-12-22 ENCOUNTER — Ambulatory Visit (HOSPITAL_COMMUNITY)
Admission: EM | Admit: 2023-12-22 | Discharge: 2023-12-22 | Disposition: A | Discharge status: Home / Self Care | Attending: Emergency Medicine | Admitting: Emergency Medicine

## 2023-12-22 DIAGNOSIS — S0502XA Injury of conjunctiva and corneal abrasion without foreign body, left eye, initial encounter: Secondary | ICD-10-CM

## 2023-12-22 DIAGNOSIS — I10 Essential (primary) hypertension: Secondary | ICD-10-CM

## 2023-12-22 MED ORDER — AMLODIPINE BESYLATE 5 MG PO TABS: 5.0000 mg | ORAL_TABLET | Freq: Every day | ORAL | 0 refills | Status: DC

## 2023-12-22 MED ORDER — DICLOFENAC SODIUM 0.1 % OP SOLN: 1.0000 [drp] | Freq: Four times a day (QID) | OPHTHALMIC | 0 refills | Status: AC

## 2023-12-22 NOTE — Discharge Instructions (Addendum)
 Start the amlodipine and take it tonight before bed.  I suggest recording your blood pressure in the morning and prior to going to bed bring in this log to your primary care provider so they can see how efficient the amlodipine is at this dose on your visit.  Ensure you are drinking at least 64 ounces of water daily and limit your intake of salt, beware of processed and frozen food as this has a pot of sodium.  Continue using your antibiotic ointment every 4 hours for the next 7 days.  You can use the diclofenac eye drops sparingly throughout today, do not use this for longer than 3 days as it can cause permanent corneal damage.  It is importantly follow-up with an ophthalmologist for further evaluation of your eye.  Return to clinic for any new or urgent symptoms.

## 2023-12-22 NOTE — ED Provider Notes (Signed)
 [facility]    CSN: [id number] Arrival date & time: 12/22/23  1001      History   Chief Complaint Chief Complaint  Patient presents with   Hypertension   Eye Problem    HPI Travis Lutz is a 68 y.o. male.   Patient presents to clinic over concern of hypertension and continued left eye pain. History provided by patient and his daughter.   Patient evaluated briefly at urgent care 4 days prior for an eye injury and sent to emergency department.  In the emergency department he had a CT of the orbits that did not show any globe rupture.  Bilateral increased IOP, ED told him they were concerned for glaucoma. He was diagnosed with a corneal abrasion and subconjunctival hemorrhage encouraged strict ophthalmology follow-up.  He has not followed up with ophthalmology.  Blood pressure was elevated at this visit.  Hx of HTN, is not currently on medication for this.  Seems to have taken amlodipine in 2022 for this but was lost to follow-up.  Overall diminished vision in the left eye since injury.  Has had a little bit of drainage and discharge this morning, he is taking the antibiotic ointment as prescribed by the emergency department.  Has not had any headaches or vision loss.  Would like to be placed on blood pressure medication today in clinic prior to his primary care appointment that he is scheduled on 01/02/24.   The history is provided by the patient and medical records.  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 Authorizing Provider  amLODipine (NORVASC) 5 MG tablet Take 1 tablet (5 mg total) by mouth daily. 12/22/23  Yes Veria Stradley  N, FNP  diclofenac (VOLTAREN) 0.1 % ophthalmic solution Place 1 drop into the left eye 4 (four) times daily for 3 days. 12/22/23 12/25/23 Yes Ricquel Foulk  N, FNP  acetaminophen (TYLENOL) 500 MG tablet Take 1 tablet (500 mg total) by mouth every 6 (six) hours as needed. 11/28/19   Lawrance Presume, MD  atorvastatin (LIPITOR) 10 MG tablet Take 1 tablet (10 mg total) by mouth daily. 05/30/21   Lawrance Presume, MD  bacitracin-polymyxin b (POLYSPORIN) ophthalmic ointment Apply a 1/2 inch strip of ointment into the Left eye every 4 hours while awake for 7 days. 12/19/23   Harris, Abigail, PA-C  ibuprofen (ADVIL) 600 MG tablet Take 1 tablet (600 mg total) by mouth every 8 (eight) hours as needed. 04/06/22   Camellia Caves, MD  oxyCODONE (ROXICODONE) 5  MG immediate release tablet Take 0.5-1 tablets (2.5-5 mg total) by mouth every 6 (six) hours as needed for severe pain (pain score 7-10). 12/19/23   Tama Fails, PA-C    Family History Family History  Problem Relation Age of Onset   Diabetes Mother     Social History Social History   Tobacco Use   Smoking status: Every Day    Current packs/day: 0.25    Types: Cigarettes   Smokeless tobacco: Never  Vaping Use   Vaping status: Never Used  Substance Use Topics   Alcohol use: Yes    Alcohol/week: 0.0 standard drinks of alcohol    Comment: beer daily    Drug use:  Yes    Types: Marijuana, Cocaine    Comment: marijuana yesterday, cocaine last week     Allergies   Patient has no known allergies.   Review of Systems Review of Systems  Per HPI  Physical Exam Triage Vital Signs ED Triage Vitals  Encounter Vitals Group     BP 12/22/23 1020 (!) 167/105     Systolic BP Percentile --      Diastolic BP Percentile --      Pulse Rate 12/22/23 1020 84     Resp 12/22/23 1020 16     Temp 12/22/23 1020 98 F (36.7 C)     Temp Source 12/22/23 1020 Oral     SpO2 12/22/23 1020 99 %     Weight 12/22/23 1019 125 lb (56.7 kg)     Height 12/22/23 1019 5\' 9"  (1.753 m)     Head Circumference --      Peak Flow --      Pain Score 12/22/23 1019 7     Pain Loc --      Pain Education --      Exclude from Growth Chart --    No data found.  Updated Vital Signs BP (!) 167/105 (BP Location: Right Arm)   Pulse 84   Temp 98 F (36.7 C) (Oral)   Resp 16   Ht 5\' 9"  (1.753 m)   Wt 125 lb (56.7 kg)   SpO2 99%   BMI 18.46 kg/m   Visual Acuity Right Eye Distance:   Left Eye Distance:   Bilateral Distance:    Right Eye Near:   Left Eye Near:    Bilateral Near:     Physical Exam Vitals and nursing note reviewed.  Constitutional:      Appearance: Normal appearance.  HENT:     Head: Normocephalic and atraumatic.     Right Ear: External ear normal.     Left Ear: External ear normal.     Nose: Nose normal.     Mouth/Throat:     Mouth: Mucous membranes are moist.  Eyes:     General: Lids are everted, no foreign bodies appreciated. Vision grossly intact. Gaze aligned appropriately.     Conjunctiva/sclera:     Left eye: Left conjunctiva is injected. Hemorrhage present.  Cardiovascular:     Rate and Rhythm: Normal rate.  Pulmonary:     Effort: Pulmonary effort is normal. No respiratory distress.  Skin:    General: Skin is warm and dry.  Neurological:     General: No focal deficit present.     Mental Status: He is alert.  Psychiatric:         Mood and Affect: Mood normal.        Behavior: Behavior is cooperative.      UC  Treatments / Results  Labs (all labs ordered are listed, but only abnormal results are displayed) Labs Reviewed - No data to display  EKG   Radiology No results found.  Procedures Procedures (including critical care time)  Medications Ordered in UC Medications - No data to display  Initial Impression / Assessment and Plan / UC Course  I have reviewed the triage vital signs and the nursing notes.  Pertinent labs & imaging results that were available during my care of the patient were reviewed by me and considered in my medical decision making (see chart for details).  Vitals in triage reviewed, patient is hemodynamically stable.  Asymptomatic hypertension in clinic, will start on 5 mg of amlodipine.  PERRLA.  Left conjunctiva continues with subconjunctival hemorrhage, irritation and watery drainage.  Encouraged continuation of antibiotic ointment as prescribed by the emergency department.  Due to pain with corneal abrasion will prescribe diclofenac eye drops for pain management and encouraged strict opthalmology f/u.   Plan of care, follow-up care return precautions given, no questions at this time.     Final Clinical Impressions(s) / UC Diagnoses   Final diagnoses:  Abrasion of left cornea, initial encounter  Essential hypertension     Discharge Instructions      Start the amlodipine and take it tonight before bed.  I suggest recording your blood pressure in the morning and prior to going to bed bring in this log to your primary care provider so they can see how efficient the amlodipine is at this dose on your visit.  Ensure you are drinking at least 64 ounces of water daily and limit your intake of salt, beware of processed and frozen food as this has a pot of sodium.  Continue using your antibiotic ointment every 4 hours for the next 7 days.  You can use the diclofenac eye drops  sparingly throughout today, do not use this for longer than 3 days as it can cause permanent corneal damage.  It is importantly follow-up with an ophthalmologist for further evaluation of your eye.  Return to clinic for any new or urgent symptoms.      ED Prescriptions     Medication Sig Dispense Auth. Provider   amLODipine (NORVASC) 5 MG tablet Take 1 tablet (5 mg total) by mouth daily. 30 tablet Harlow Lighter, Kaydence Menard  N, FNP   diclofenac (VOLTAREN) 0.1 % ophthalmic solution Place 1 drop into the left eye 4 (four) times daily for 3 days. 5 mL Harlow Lighter, Yanissa Michalsky  N, FNP      PDMP not reviewed this encounter.   Harlow Lighter, Tykeria Wawrzyniak  N, FNP 12/22/23 1058

## 2023-12-22 NOTE — ED Triage Notes (Signed)
 Patient here today due to his blood pressure being elevated. Patient went to the hospital a few days ago for an eye injury and was told that his blood pressure was high and needed to follow up with PCP. Patient previously on amlodipine but has not taken it in years. Patient scheduled PCP appointment for 01/02/2024 but would like something to get him to his appointment.   Patient also states that his left eye is still bothering him and would like to follow up with that. Patient has been using the eye ointment prescribed with no relief.

## 2024-01-02 ENCOUNTER — Ambulatory Visit (INDEPENDENT_AMBULATORY_CARE_PROVIDER_SITE_OTHER): Payer: Self-pay | Admitting: Medical

## 2024-01-02 VITALS — BP 130/80 | HR 78 | Wt 116.4 lb

## 2024-01-02 DIAGNOSIS — E538 Deficiency of other specified B group vitamins: Secondary | ICD-10-CM | POA: Diagnosis not present

## 2024-01-02 DIAGNOSIS — E782 Mixed hyperlipidemia: Secondary | ICD-10-CM | POA: Diagnosis not present

## 2024-01-02 DIAGNOSIS — E559 Vitamin D deficiency, unspecified: Secondary | ICD-10-CM

## 2024-01-02 DIAGNOSIS — R400 Somnolence: Secondary | ICD-10-CM

## 2024-01-02 DIAGNOSIS — M25469 Effusion, unspecified knee: Secondary | ICD-10-CM

## 2024-01-02 DIAGNOSIS — N400 Enlarged prostate without lower urinary tract symptoms: Secondary | ICD-10-CM | POA: Diagnosis not present

## 2024-01-02 DIAGNOSIS — S0591XA Unspecified injury of right eye and orbit, initial encounter: Secondary | ICD-10-CM | POA: Insufficient documentation

## 2024-01-02 DIAGNOSIS — Z125 Encounter for screening for malignant neoplasm of prostate: Secondary | ICD-10-CM | POA: Diagnosis not present

## 2024-01-02 DIAGNOSIS — F17200 Nicotine dependence, unspecified, uncomplicated: Secondary | ICD-10-CM | POA: Diagnosis not present

## 2024-01-02 DIAGNOSIS — R5383 Other fatigue: Secondary | ICD-10-CM | POA: Diagnosis not present

## 2024-01-02 DIAGNOSIS — S0591XS Unspecified injury of right eye and orbit, sequela: Secondary | ICD-10-CM

## 2024-01-02 DIAGNOSIS — I10 Essential (primary) hypertension: Secondary | ICD-10-CM | POA: Diagnosis not present

## 2024-01-02 DIAGNOSIS — R972 Elevated prostate specific antigen [PSA]: Secondary | ICD-10-CM

## 2024-01-02 DIAGNOSIS — G8929 Other chronic pain: Secondary | ICD-10-CM

## 2024-01-02 DIAGNOSIS — R9431 Abnormal electrocardiogram [ECG] [EKG]: Secondary | ICD-10-CM | POA: Insufficient documentation

## 2024-01-02 DIAGNOSIS — M25561 Pain in right knee: Secondary | ICD-10-CM

## 2024-01-02 DIAGNOSIS — M25361 Other instability, right knee: Secondary | ICD-10-CM | POA: Diagnosis not present

## 2024-01-02 LAB — POCT URINALYSIS DIP (PROADVANTAGE DEVICE)
Bilirubin, UA: NEGATIVE
Blood, UA: NEGATIVE
Glucose, UA: NEGATIVE mg/dL
Ketones, POC UA: NEGATIVE mg/dL
Nitrite, UA: NEGATIVE
Specific Gravity, Urine: 1.02
Urobilinogen, Ur: NEGATIVE
pH, UA: 6 (ref 5.0–8.0)

## 2024-01-02 MED ORDER — NAPROXEN 500 MG PO TABS: 500.0000 mg | ORAL_TABLET | Freq: Two times a day (BID) | ORAL | 0 refills | Status: DC

## 2024-01-02 MED ORDER — ATORVASTATIN CALCIUM 10 MG PO TABS: 10.0000 mg | ORAL_TABLET | Freq: Every day | ORAL | 0 refills | Status: DC

## 2024-01-02 MED ORDER — AMLODIPINE BESYLATE 5 MG PO TABS: 5.0000 mg | ORAL_TABLET | Freq: Every day | ORAL | 0 refills | Status: DC

## 2024-01-02 NOTE — Progress Notes (Signed)
 Subjective:  Travis Lutz is a 68 y.o. male who presents for Chief Complaint  Patient presents with   Acute Visit    New pt, get established, having right knee pain. Falling twice a day due to the pain. Eye pain x 3 weeks due to bungee cord hitting him in the eye.     Here as a new patient.  I see his daughter [name] as a patient.  He has several concerns.  He was seen in emergency dept few weeks ago for eye injury.  He was using a bungee cord to hold down something and the bungee cord hit him in the right eye.  He was seen and treated at the emergency department still having problems with the right eye.  He does not have an eye doctor.  He has ongoing problems with his right knee for probably the last year.  It gives out on him at least twice a week.  It feels swollen.  It hurts all the time.  He cannot walk on it sometimes.  He is retired.  He needs refills on his blood pressure and cholesterol medicine  He feels sleepy and tired all the time.  He is not sure about snoring.  He lives alone.  He does drink about a sixpack of alcohol per week.  He smokes about 1 pack/week of cigarettes.  He uses marijuana.  No other drugs.  No prior sleep study.  No chest pain but does feel decreased energy.  No shortness of breath  No other aggravating or relieving factors.    No other c/o.  Past Medical History:  Diagnosis Date   Essential hypertension 05/30/2021   Current Outpatient Medications on File Prior to Visit  Medication Sig Dispense Refill   acetaminophen  (TYLENOL ) 500 MG tablet Take 1 tablet (500 mg total) by mouth every 6 (six) hours as needed. 60 tablet 0   ibuprofen  (ADVIL ) 600 MG tablet Take 1 tablet (600 mg total) by mouth every 8 (eight) hours as needed. 20 tablet 0   No current facility-administered medications on file prior to visit.    The following portions of the patient's history were reviewed and updated as appropriate: allergies, current medications, past family  history, past medical history, past social history, past surgical history and problem list.   ROS Otherwise as in subjective above    Objective: BP 130/80   Pulse 78   Wt 116 lb 6.4 oz (52.8 kg)   SpO2 98%   BMI 17.19 kg/m   General appearance: alert, no distress, well developed, well nourished, lean African-American male Right eye without obvious deformity, no obvious bruising, PERRLA, EOMI HEENT: normocephalic, sclerae anicteric, conjunctiva pink and moist, TMs pearly, nares patent, no discharge or erythema, pharynx normal Oral cavity: MMM, no lesions Neck: supple, no lymphadenopathy, no thyromegaly, no masses, no JVD Heart: RRR, normal S1, S2, no murmurs Lungs: CTA bilaterally, no wheezes, rhonchi, or rales Pulses: 2+ radial pulses, 2+ pedal pulses, normal cap refill Ext: no edema Right knee tender over the patella and patellar tendon, he is somewhat guarded, there is some mild to moderate effusion present as well, no obvious laxity but he is guarded to the point I cannot do a complete exam, he is tight with knee extension and flexion, he seems to have pain with varus and valgus stress, otherwise legs unremarkable  EKG reviewed, abnormal, T wave inversions V4 and V5 different than prior EKG 2022    Assessment: Encounter Diagnoses  Name Primary?  Chronic pain of right knee Yes   Instability of right knee joint    Knee swelling    Right eye injury, sequela    Essential hypertension    Mixed hyperlipidemia    Vitamin B 12 deficiency    Vitamin D  deficiency    Elevated PSA    Fatigue, unspecified type    Somnolence    Abnormal EKG    Smoker      Plan: There are several concerns today  Given your right knee pain and the swelling I am referring you to orthopedics urgently.   Expect a phone call hopefully within the next few days about a visit to see orthopedic specialist.  They may need to aspirate some fluid out of your knee space Begin some Naprosyn   prescription twice daily to help with pain and inflammation  Given your recent injury, I am referring you to see an eye doctor.  Expect a phone call about setting this appointment up soon as well  High blood pressure get back on your medication amlodipine  5 mg daily.  I sent this to the pharmacy  High cholesterol and prior scan showing cholesterol buildup in your aorta and coronary arteries Get back on your cholesterol medicine atorvastatin  Lipitor daily  Fatigue There is prior notation in your chart about being low on B12, vitamin D  I am checking labs today to further evaluate your fatigue I would limit alcohol and limit marijuana use It is not clear yet why you may be so tired or sleepy.  It could be related to vitamin deficiency, could be aggravated by alcohol use, could be related to sleep apnea or even heart issues We will call with lab results  You have had an elevated PSA marker in the past-I am rechecking this today to help screen for prostate cancer  Your EKG is abnormal today different than your EKG in 2022.  There may be a concern for blood flow getting to parts of your heart.  I do recommend seeing a heart doctor for evaluation    Travis Lutz was seen today for acute visit.  Diagnoses and all orders for this visit:  Chronic pain of right knee -     AMB referral to orthopedics  Instability of right knee joint -     AMB referral to orthopedics  Knee swelling -     AMB referral to orthopedics  Right eye injury, sequela -     Ambulatory referral to Ophthalmology  Essential hypertension -     Ambulatory referral to Cardiology  Mixed hyperlipidemia -     atorvastatin  (LIPITOR) 10 MG tablet; Take 1 tablet (10 mg total) by mouth daily. -     Ambulatory referral to Cardiology  Vitamin B 12 deficiency -     Vitamin B12  Vitamin D  deficiency -     VITAMIN D  25 Hydroxy (Vit-D Deficiency, Fractures) -     PSA  Elevated PSA  Fatigue, unspecified type -     Vitamin  B12 -     TSH + free T4 -     VITAMIN D  25 Hydroxy (Vit-D Deficiency, Fractures) -     EKG 12-Lead -     POCT Urinalysis DIP (Proadvantage Device) -     Ambulatory referral to Cardiology  Somnolence  Abnormal EKG -     Ambulatory referral to Cardiology  Smoker  Other orders -     amLODipine  (NORVASC ) 5 MG tablet; Take 1 tablet (5 mg total) by mouth  daily. -     naproxen  (NAPROSYN ) 500 MG tablet; Take 1 tablet (500 mg total) by mouth 2 (two) times daily with a meal.    Follow up: pending labs

## 2024-01-02 NOTE — Patient Instructions (Signed)
 There are several concerns today  Given your right knee pain and the swelling I am referring you to orthopedics urgently.   Expect a phone call hopefully within the next few days about a visit to see orthopedic specialist.  They may need to aspirate some fluid out of your knee space Begin some Naprosyn  prescription twice daily to help with pain and inflammation  Given your recent injury, I am referring you to see an eye doctor.  Expect a phone call about setting this appointment up soon as well  High blood pressure get back on your medication amlodipine  5 mg daily.  I sent this to the pharmacy  High cholesterol and prior scan showing cholesterol buildup in your aorta and coronary arteries Get back on your cholesterol medicine atorvastatin  Lipitor daily  Fatigue There is prior notation in your chart about being low on B12, vitamin D  I am checking labs today to further evaluate your fatigue I would limit alcohol and limit marijuana use It is not clear yet why you may be so tired or sleepy.  It could be related to vitamin deficiency, could be aggravated by alcohol use, could be related to sleep apnea or even heart issues We will call with lab results  You have had an elevated PSA marker in the past-I am rechecking this today to help screen for prostate cancer  Your EKG is abnormal today different than your EKG in 2022.  There may be a concern for blood flow getting to parts of your heart.  I do recommend seeing a heart doctor for evaluation

## 2024-01-03 ENCOUNTER — Other Ambulatory Visit: Payer: Self-pay | Admitting: Medical

## 2024-01-03 LAB — TSH+FREE T4
Free T4: 1.07 ng/dL (ref 0.82–1.77)
TSH: 0.766 u[IU]/mL (ref 0.450–4.500)

## 2024-01-03 LAB — VITAMIN B12: Vitamin B-12: 210 pg/mL — ABNORMAL LOW (ref 232–1245)

## 2024-01-03 LAB — PSA: Prostate Specific Ag, Serum: 4.5 ng/mL — ABNORMAL HIGH (ref 0.0–4.0)

## 2024-01-03 LAB — VITAMIN D 25 HYDROXY (VIT D DEFICIENCY, FRACTURES): Vit D, 25-Hydroxy: 7.5 ng/mL — ABNORMAL LOW (ref 30.0–100.0)

## 2024-01-03 MED ORDER — VITAMIN D (ERGOCALCIFEROL) 1.25 MG (50000 UNIT) PO CAPS: 50000.0000 [IU] | ORAL_CAPSULE | ORAL | 1 refills | Status: DC

## 2024-01-03 MED ORDER — VITAMIN B-12 1000 MCG PO TABS
1000.0000 ug | ORAL_TABLET | Freq: Every day | ORAL | 1 refills | Status: DC
Start: 2024-01-03 — End: 2024-04-02

## 2024-01-03 NOTE — Progress Notes (Signed)
 Labs showed low vitamin D , low vitamin B-12, PSA marker is elevated but stable, thyroid okay.  Continue plan for other referrals.  Expect phone calls about referrals to orthopedic, eye doctor and cardiology  Restart your blood pressure and cholesterol medicine  Begin prescription vitamin D  sent to the pharmacy, weekly dose  Begin B12 daily supplement sent to the pharmacy.  Limit alcohol or cut back on alcohol.  I recommend you get a variety of fruits and vegetables grains and meat in the diet every day  If desired you can come in for a monthly B12 injection to also help bring up the B12 level  Lets plan to see you back in 3 months fasting

## 2024-01-07 ENCOUNTER — Ambulatory Visit (INDEPENDENT_AMBULATORY_CARE_PROVIDER_SITE_OTHER): Admitting: Medical

## 2024-01-07 DIAGNOSIS — E538 Deficiency of other specified B group vitamins: Secondary | ICD-10-CM

## 2024-01-07 MED ORDER — CYANOCOBALAMIN 1000 MCG/ML IJ SOLN
1000.0000 ug | Freq: Once | INTRAMUSCULAR | Status: AC
Start: 2024-01-07 — End: 2024-01-07
  Administered 2024-01-07: 1000 ug via INTRAMUSCULAR

## 2024-01-08 ENCOUNTER — Other Ambulatory Visit: Payer: Self-pay | Admitting: Medical

## 2024-01-08 ENCOUNTER — Ambulatory Visit: Admitting: Orthopaedic Surgery

## 2024-01-08 ENCOUNTER — Other Ambulatory Visit (INDEPENDENT_AMBULATORY_CARE_PROVIDER_SITE_OTHER): Payer: Self-pay

## 2024-01-08 DIAGNOSIS — M25561 Pain in right knee: Secondary | ICD-10-CM

## 2024-01-08 DIAGNOSIS — E782 Mixed hyperlipidemia: Secondary | ICD-10-CM

## 2024-01-08 DIAGNOSIS — G8929 Other chronic pain: Secondary | ICD-10-CM

## 2024-01-08 MED ORDER — LIDOCAINE HCL 1 % IJ SOLN
2.0000 mL | INTRAMUSCULAR | Status: AC | PRN
Start: 2024-01-08 — End: 2024-01-08
  Administered 2024-01-08: 2 mL

## 2024-01-08 MED ORDER — METHYLPREDNISOLONE ACETATE 40 MG/ML IJ SUSP
40.0000 mg | INTRAMUSCULAR | Status: AC | PRN
  Administered 2024-01-08: 40 mg via INTRA_ARTICULAR

## 2024-01-08 MED ORDER — DICLOFENAC SODIUM 75 MG PO TBEC: 75.0000 mg | DELAYED_RELEASE_TABLET | Freq: Two times a day (BID) | ORAL | 2 refills | Status: DC

## 2024-01-08 MED ORDER — BUPIVACAINE HCL 0.5 % IJ SOLN
2.0000 mL | INTRAMUSCULAR | Status: AC | PRN
  Administered 2024-01-08: 2 mL via INTRA_ARTICULAR

## 2024-01-08 NOTE — Progress Notes (Signed)
 Office Visit Note   Patient: Travis Lutz           Date of Birth: 23-Sep-1955           MRN: [id number] Visit Date: 01/08/2024              Requested by: Claudene Crystal, PA-C [address] [place],  [place] [address] PCP: Claudene Crystal, PA-C   Assessment & Plan: Visit Diagnoses:  1. Chronic pain of right knee     Plan: Assessment and Plan    Osteoarthritis of right knee Chronic osteoarthritis with bone on bone contact, effusion, and limited range of motion. X-rays confirm diagnosis. Conservative management preferred initially. - Perform knee aspiration and inject cortisone. - Prescribe diclofenac  for pain. - Consider knee replacement surgery if conservative measures fail. - Follow up based on symptom relief and progression.     Follow-Up Instructions: No follow-ups on file.   Orders:  Orders Placed This Encounter  Procedures   Large Joint Inj: R knee   XR KNEE 3 VIEW RIGHT   Meds ordered this encounter  Medications   diclofenac  (VOLTAREN ) 75 MG EC tablet    Sig: Take 1 tablet (75 mg total) by mouth 2 (two) times daily.    Dispense:  30 tablet    Refill:  2      Procedures: Large Joint Inj: R knee on 01/08/2024 4:03 PM Indications: pain Details: 22 G needle  Arthrogram: No  Medications: 40 mg methylPREDNISolone  acetate 40 MG/ML; 2 mL lidocaine  1 %; 2 mL bupivacaine 0.5 % Consent was given by the patient. Patient was prepped and draped in the usual sterile fashion.       Clinical Data: No additional findings.   Subjective: Chief Complaint  Patient presents with   Right Knee - Pain    HPI Discussed the use of AI scribe software for clinical note transcription with the patient, who gave verbal consent to proceed.  History of Present Illness   Travis Lutz is a 68 year old male who presents with worsening chronic right knee pain and swelling.  He has experienced right knee pain for several years, which has progressively worsened. The  pain is associated with swelling and occasional 'giving out' of the knee. He experiences episodes where the knee 'just gives out' but can sometimes anticipate these events.  The knee pain and swelling significantly impact his ability to engage in activities, such as riding his new motorcycle, which he is currently unable to do due to the knee issues.  He has not undergone any specific prior treatments for the knee pain. He is not on any specific medication for the knee pain but is interested in anti-inflammatory arthritis medications. Swelling in the knee is confirmed as an effusion.      Review of Systems  Constitutional: Negative.   HENT: Negative.    Eyes: Negative.   Respiratory: Negative.    Cardiovascular: Negative.   Gastrointestinal: Negative.   Endocrine: Negative.   Genitourinary: Negative.   Skin: Negative.   Allergic/Immunologic: Negative.   Neurological: Negative.   Hematological: Negative.   Psychiatric/Behavioral: Negative.    All other systems reviewed and are negative.    Objective: Vital Signs: There were no vitals taken for this visit.  Physical Exam Vitals and nursing note reviewed.  Constitutional:      Appearance: He is well-developed.  HENT:     Head: Normocephalic and atraumatic.  Eyes:     Pupils: Pupils are  equal, round, and reactive to light.  Pulmonary:     Effort: Pulmonary effort is normal.  Abdominal:     Palpations: Abdomen is soft.  Musculoskeletal:        General: Normal range of motion.     Cervical back: Neck supple.  Skin:    General: Skin is warm.  Neurological:     Mental Status: He is alert and oriented to person, place, and time.  Psychiatric:        Behavior: Behavior normal.        Thought Content: Thought content normal.        Judgment: Judgment normal.     Ortho Exam Physical Exam   MUSCULOSKELETAL: Right knee flexion to 115 degrees with pain. Right knee effusion present.   Joint line tenderness. Specialty  Comments:  No specialty comments available.  Imaging: XR KNEE 3 VIEW RIGHT Result Date: 01/08/2024 X-rays demonstrate severe osteoarthritis.  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   Chronic pain of right knee 01/02/2024   Somnolence 01/02/2024   Fatigue 01/02/2024   Abnormal EKG 01/02/2024   Smoker 01/02/2024   Adrenal adenoma, left 07/06/2021   Vitamin B 12 deficiency 06/01/2021   Essential hypertension 05/30/2021   Anemia 05/30/2021   Mixed hyperlipidemia 05/06/2021   Elevated PSA 05/06/2021   Dyspnea 02/29/2016   Chronic low back pain 12/06/2015   Bilateral chronic knee pain 12/06/2015   Foot pain, bilateral 12/06/2015   Vitamin D  deficiency 11/16/2015   Migraine headache 11/15/2015   Testicular pain 11/15/2015   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 11/15/2015   Homeless single person 11/15/2015   Tobacco dependence 11/15/2015   Alcohol use disorder 11/15/2015   Past Medical History:  Diagnosis Date   Essential hypertension 05/30/2021    Family History  Problem Relation Age of Onset   Diabetes Mother     Past Surgical History:  Procedure Laterality Date   PUD repair     REPAIR OF PERFORATED ULCER     Social History   Occupational History   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

## 2024-01-18 NOTE — Progress Notes (Signed)
 Duplicate/error

## 2024-01-21 DIAGNOSIS — S0512XD Contusion of eyeball and orbital tissues, left eye, subsequent encounter: Secondary | ICD-10-CM | POA: Diagnosis not present

## 2024-01-21 DIAGNOSIS — H04123 Dry eye syndrome of bilateral lacrimal glands: Secondary | ICD-10-CM | POA: Diagnosis not present

## 2024-01-21 DIAGNOSIS — H5319 Other subjective visual disturbances: Secondary | ICD-10-CM | POA: Diagnosis not present

## 2024-02-07 ENCOUNTER — Encounter: Payer: Self-pay | Admitting: Medical

## 2024-02-07 DIAGNOSIS — H1132 Conjunctival hemorrhage, left eye: Secondary | ICD-10-CM | POA: Diagnosis not present

## 2024-02-07 DIAGNOSIS — H5712 Ocular pain, left eye: Secondary | ICD-10-CM | POA: Diagnosis not present

## 2024-02-20 DIAGNOSIS — H0520 Unspecified exophthalmos: Secondary | ICD-10-CM | POA: Diagnosis not present

## 2024-02-20 DIAGNOSIS — S0512XD Contusion of eyeball and orbital tissues, left eye, subsequent encounter: Secondary | ICD-10-CM | POA: Diagnosis not present

## 2024-02-20 DIAGNOSIS — H5319 Other subjective visual disturbances: Secondary | ICD-10-CM | POA: Diagnosis not present

## 2024-02-20 DIAGNOSIS — H5712 Ocular pain, left eye: Secondary | ICD-10-CM | POA: Diagnosis not present

## 2024-02-25 ENCOUNTER — Ambulatory Visit: Payer: Self-pay | Admitting: Internal Medicine

## 2024-02-28 ENCOUNTER — Ambulatory Visit (HOSPITAL_BASED_OUTPATIENT_CLINIC_OR_DEPARTMENT_OTHER): Admitting: Student

## 2024-02-28 ENCOUNTER — Ambulatory Visit (HOSPITAL_BASED_OUTPATIENT_CLINIC_OR_DEPARTMENT_OTHER)

## 2024-02-28 ENCOUNTER — Encounter (HOSPITAL_BASED_OUTPATIENT_CLINIC_OR_DEPARTMENT_OTHER): Payer: Self-pay | Admitting: Student

## 2024-02-28 DIAGNOSIS — M25562 Pain in left knee: Secondary | ICD-10-CM

## 2024-02-28 DIAGNOSIS — M25561 Pain in right knee: Secondary | ICD-10-CM | POA: Diagnosis not present

## 2024-02-28 DIAGNOSIS — M25462 Effusion, left knee: Secondary | ICD-10-CM | POA: Diagnosis not present

## 2024-02-28 MED ORDER — LIDOCAINE HCL 1 % IJ SOLN
4.0000 mL | INTRAMUSCULAR | Status: AC | PRN
  Administered 2024-02-28: 4 mL

## 2024-02-28 MED ORDER — TRIAMCINOLONE ACETONIDE 40 MG/ML IJ SUSP
2.0000 mL | INTRAMUSCULAR | Status: AC | PRN
  Administered 2024-02-28: 2 mL via INTRA_ARTICULAR

## 2024-02-28 NOTE — Progress Notes (Signed)
 Chief Complaint: Bilateral knee pain    Discussed the use of AI scribe software for clinical note transcription with the patient, who gave verbal consent to proceed.  History of Present Illness Travis Lutz is a 68 year old male who presents with worsening bilateral knee pain and difficulty walking.  He experiences significant bilateral knee pain, with the left knee more painful than the right. The pain began a couple of days ago, is located medially, and is associated with popping and cracking sounds. He cannot put pressure on the left knee and has difficulty with extension and flexion. He previously received a cortisone injection in the right knee on 4/29, which initially relieved pain, but the pain has returned. Both knees have been giving out, leading to three falls. He is concerned about mobility and the risk of falling. He is not taking any medication for the pain but is considering over-the-counter options like ibuprofen  or Tylenol . Various knee braces and sleeves have been ineffective and sometimes exacerbate the pain.   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, mining  Tobacco Use   Smoking status: Every Day    Current packs/day: 0.25    Types: Cigarettes   Smokeless tobacco: Never  Vaping Use   Vaping status: Never Used  Substance and Sexual Activity   Alcohol use: Yes    Alcohol/week: 0.0 standard drinks of alcohol    Comment: beer daily    Drug use: Yes     Types: Marijuana, Cocaine    Comment: marijuana yesterday, cocaine last week   Sexual activity: Yes  Other Topics Concern   Not on file  Social History Narrative   Not on file   Social Drivers of Health   Financial Resource Strain: Not on file  Food Insecurity: Not on file  Transportation Needs: Not on file  Physical Activity: Not on file  Stress: Not on file  Social Connections: Not on file   Family History  Problem Relation Age of Onset   Diabetes Mother    No Known Allergies Current Outpatient Medications  Medication Sig Dispense Refill   acetaminophen  (TYLENOL ) 500 MG tablet Take 1 tablet (500 mg total) by mouth every 6 (six) hours as needed. 60 tablet 0   amLODipine  (NORVASC ) 5 MG tablet Take 1 tablet (5 mg total) by mouth daily. 90 tablet 0   atorvastatin  (LIPITOR) 10 MG tablet Take 1 tablet (10 mg total) by mouth daily. 90 tablet 0   cyanocobalamin  (VITAMIN B12) 1000 MCG tablet Take 1 tablet (1,000 mcg total) by mouth daily. 90 tablet 1   diclofenac  (VOLTAREN ) 75 MG EC tablet Take 1 tablet (75 mg total) by mouth 2 (two) times daily. 30 tablet 2   ibuprofen  (ADVIL ) 600 MG tablet Take 1 tablet (600 mg  total) by mouth every 8 (eight) hours as needed. 20 tablet 0   naproxen  (NAPROSYN ) 500 MG tablet Take 1 tablet (500 mg total) by mouth 2 (two) times daily with a meal. 30 tablet 0   Vitamin D , Ergocalciferol , (DRISDOL ) 1.25 MG (50000 UNIT) CAPS capsule Take 1 capsule (50,000 Units total) by mouth every 7 (seven) days. 12 capsule 1   No current facility-administered medications for this visit.   No results found.  Review of Systems:   A ROS was performed including pertinent positives and negatives as documented in the HPI.  Physical Exam :   Constitutional: NAD and appears stated age Neurological: Alert and oriented Psych: Appropriate affect and cooperative There were no vitals taken for this visit.   Comprehensive Musculoskeletal Exam:    Exam of the left knee  demonstrates bilateral joint line tenderness, worse medially.  Active range of motion is limited from 20 to 90 degrees.  Minimal effusion present.  No overlying erythema or warmth.  Imaging:   Xray (left knee 4 views): Minimal to mild tricompartmental degenerative changes.  Chronic appearing ossification noted off the tibial tubercle suggestive of enthesophyte.   I personally reviewed and interpreted the radiographs.      Assessment & Plan Knee osteoarthritis   Patient demonstrates bilateral knee pain left worse than right.  Right knee does show bone-on-bone arthritis within the medial compartment and he received temporary relief from cortisone injection although this has worn off.  Left knee has become more bothersome recently however and he states that both knees have been buckling leading to multiple falls.  Joint space is more well-appearing within the left knee than the right.  I have offered a cortisone injection today of the left knee which patient would like to proceed with.  Injection was performed and he tolerated the procedure well.  Patient has a follow-up scheduled with Dr. Christiane Cowing on 6/24, so recommend continued treatment discussion and assessment of injection relief at that time.     Procedure Note  Patient: Travis Lutz             Date of Birth: Sep 20, 1955           MRN: [id number]             Visit Date: 02/28/2024  Procedures: Visit Diagnoses:  1. Acute pain of left knee     Large Joint Inj: L knee on 02/28/2024 6:39 PM Indications: pain Details: 22 G 1.5 in needle, anterolateral approach Medications: 4 mL lidocaine  1 %; 2 mL triamcinolone  acetonide 40 MG/ML Outcome: tolerated well, no immediate complications Procedure, treatment alternatives, risks and benefits explained, specific risks discussed. Consent was given by the patient. Immediately prior to procedure a time out was called to verify the correct patient, procedure, equipment, support staff and site/side  marked as required. Patient was prepped and draped in the usual sterile fashion.       I personally saw and evaluated the patient, and participated in the management and treatment plan.  Sharrell Deck, PA-C Orthopedics

## 2024-03-03 ENCOUNTER — Other Ambulatory Visit: Payer: Self-pay

## 2024-03-03 DIAGNOSIS — H0520 Unspecified exophthalmos: Secondary | ICD-10-CM

## 2024-03-04 ENCOUNTER — Ambulatory Visit (INDEPENDENT_AMBULATORY_CARE_PROVIDER_SITE_OTHER): Admitting: Orthopaedic Surgery

## 2024-03-04 DIAGNOSIS — M1712 Unilateral primary osteoarthritis, left knee: Secondary | ICD-10-CM | POA: Diagnosis not present

## 2024-03-04 DIAGNOSIS — M170 Bilateral primary osteoarthritis of knee: Secondary | ICD-10-CM | POA: Diagnosis not present

## 2024-03-04 DIAGNOSIS — M1711 Unilateral primary osteoarthritis, right knee: Secondary | ICD-10-CM | POA: Diagnosis not present

## 2024-03-04 MED ORDER — TRAMADOL HCL 50 MG PO TABS: 50.0000 mg | ORAL_TABLET | Freq: Every day | ORAL | 0 refills | Status: DC | PRN

## 2024-03-04 NOTE — Progress Notes (Signed)
 Office Visit Note   Patient: Travis Lutz           Date of Birth: Sep 15, 1955           MRN: [id number] Visit Date: 03/04/2024              Requested by: Bulah Alm RAMAN, PA-C [address] [place],  [place] [address] PCP: Bulah Alm RAMAN, PA-C   Assessment & Plan: Visit Diagnoses:  1. Primary osteoarthritis of right knee   2. Primary osteoarthritis of left knee     Plan: History of Present Illness Travis Lutz is a 68 year old male who presents with bilateral knee pain, worse on the left.  He experiences significant bilateral knee pain, with the right knee being more severe than the left. The pain disrupts his sleep. A cortisone injection in April provided temporary relief. Tylenol  is no longer effective for pain management. He is open to trying other medications.  He has stable housing and lives with assistance at home, supported by his daughter, [name]. He consumes one small can of beer daily and smokes about a pack of cigarettes per week. No drug use and no known allergies to nickel.  Physical Exam MUSCULOSKELETAL: Right knee effusion, 0-120 degrees with pain throughout ROM.  Collaterals and cruciates are stable.  Significant joint line tenderness.  Antalgic gait.  Assessment and Plan Impression is severe right knee degenerative joint disease secondary to Osteoarthritis.  Patient has attempted conservative treatment for at least 6 consecutive weeks within the past 12 weeks, including but not limited to physical therapy, home exercise program, NSAIDs, activity modification, and/or corticosteroid injections. Despite these efforts, symptoms have not improved or have worsened. Conservative measures have been deemed unsuccessful at this time. After a detailed discussion covering diagnosis and treatment options--including the risks, benefits, alternatives, and potential complications of surgical and nonsurgical management--the patient elected to proceed with  surgery  Anticoagulants: No antithrombotic Postop anticoagulation: Eliquis Diabetic: No  Nickel allergy: No Prior DVT/PE: No Tobacco use: Yes 1 ppw Clearances needed for surgery: PCP Anticipated discharge dispo: Home  - Order prealbumin to assess nutritional status - Obtain surgical clearance from primary care. - Prescribe tramadol  for breakthrough pain. - Schedule surgery on or after April 08, 2024, due to cortisone injection.  Follow-Up Instructions: No follow-ups on file.   Orders:  Orders Placed This Encounter  Procedures   Prealbumin   Meds ordered this encounter  Medications   traMADol  (ULTRAM ) 50 MG tablet    Sig: Take 1-2 tablets (50-100 mg total) by mouth daily as needed.    Dispense:  20 tablet    Refill:  0    Subjective: Chief Complaint  Patient presents with   Left Knee - Pain    HPI  Review of Systems  Constitutional: Negative.   HENT: Negative.    Eyes: Negative.   Respiratory: Negative.    Cardiovascular: Negative.   Gastrointestinal: Negative.   Endocrine: Negative.   Genitourinary: Negative.   Skin: Negative.   Allergic/Immunologic: Negative.   Neurological: Negative.   Hematological: Negative.   Psychiatric/Behavioral: Negative.    All other systems reviewed and are negative.    Objective: Vital Signs: There were no vitals taken for this visit.  Physical Exam Vitals and nursing note reviewed.  Constitutional:      Appearance: He is well-developed.  HENT:     Head: Normocephalic and atraumatic.   Eyes:     Pupils: Pupils are equal, round, and reactive  to light.   Pulmonary:     Effort: Pulmonary effort is normal.  Abdominal:     Palpations: Abdomen is soft.   Musculoskeletal:        General: Normal range of motion.     Cervical back: Neck supple.   Skin:    General: Skin is warm.   Neurological:     Mental Status: He is alert and oriented to person, place, and time.   Psychiatric:        Behavior: Behavior normal.         Thought Content: Thought content normal.        Judgment: Judgment normal.      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   Chronic pain of right knee 01/02/2024   Somnolence 01/02/2024   Fatigue 01/02/2024   Abnormal EKG 01/02/2024   Smoker 01/02/2024   Adrenal adenoma, left 07/06/2021   Vitamin B 12 deficiency 06/01/2021   Essential hypertension 05/30/2021   Anemia 05/30/2021   Mixed hyperlipidemia 05/06/2021   Elevated PSA 05/06/2021   Dyspnea 02/29/2016   Chronic low back pain 12/06/2015   Bilateral chronic knee pain 12/06/2015   Foot pain, bilateral 12/06/2015   Vitamin D  deficiency 11/16/2015   Migraine headache 11/15/2015   Testicular pain 11/15/2015   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 11/15/2015   Homeless single person 11/15/2015   Tobacco dependence 11/15/2015   Alcohol use disorder 11/15/2015   Past Medical History:  Diagnosis Date   Essential hypertension 05/30/2021    Family History  Problem Relation Age of Onset   Diabetes Mother     Past Surgical History:  Procedure Laterality Date   PUD repair     REPAIR OF PERFORATED ULCER     Social History   Occupational History   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

## 2024-03-05 ENCOUNTER — Telehealth: Payer: Self-pay

## 2024-03-05 LAB — EXTRA LAV TOP TUBE

## 2024-03-05 LAB — PREALBUMIN: Prealbumin: 30 mg/dL (ref 21–43)

## 2024-03-05 NOTE — Telephone Encounter (Signed)
 Faxed clearance form to PCP ([facility])

## 2024-03-07 ENCOUNTER — Ambulatory Visit
Admission: RE | Admit: 2024-03-07 | Discharge: 2024-03-07 | Disposition: A | Discharge status: Home / Self Care | Source: Ambulatory Visit

## 2024-03-07 DIAGNOSIS — H0520 Unspecified exophthalmos: Secondary | ICD-10-CM | POA: Diagnosis not present

## 2024-03-07 DIAGNOSIS — H532 Diplopia: Secondary | ICD-10-CM | POA: Diagnosis not present

## 2024-03-07 NOTE — Telephone Encounter (Signed)
 I received a surgery clearance request.  When I saw him back in April he had an abnormal EKG and I made a referral to cardiology.  I do not see that he has seen the cardiologist yet.  What is the status on the cardiology appointment?  He will need to have clearance from cardiology before we can clear him for surgery

## 2024-03-31 ENCOUNTER — Encounter: Payer: Self-pay | Admitting: Medical

## 2024-03-31 DIAGNOSIS — N400 Enlarged prostate without lower urinary tract symptoms: Secondary | ICD-10-CM | POA: Insufficient documentation

## 2024-04-02 ENCOUNTER — Ambulatory Visit: Admitting: Medical

## 2024-04-02 ENCOUNTER — Ambulatory Visit: Payer: Self-pay | Admitting: Medical

## 2024-04-02 ENCOUNTER — Other Ambulatory Visit: Payer: Self-pay | Admitting: Medical

## 2024-04-02 ENCOUNTER — Encounter: Payer: Self-pay | Admitting: Medical

## 2024-04-02 ENCOUNTER — Ambulatory Visit
Admission: RE | Admit: 2024-04-02 | Discharge: 2024-04-02 | Disposition: A | Discharge status: Home / Self Care | Source: Ambulatory Visit | Attending: Medical | Admitting: Medical

## 2024-04-02 DIAGNOSIS — R9431 Abnormal electrocardiogram [ECG] [EKG]: Secondary | ICD-10-CM

## 2024-04-02 DIAGNOSIS — N400 Enlarged prostate without lower urinary tract symptoms: Secondary | ICD-10-CM | POA: Diagnosis not present

## 2024-04-02 DIAGNOSIS — R198 Other specified symptoms and signs involving the digestive system and abdomen: Secondary | ICD-10-CM

## 2024-04-02 DIAGNOSIS — R112 Nausea with vomiting, unspecified: Secondary | ICD-10-CM

## 2024-04-02 DIAGNOSIS — F17200 Nicotine dependence, unspecified, uncomplicated: Secondary | ICD-10-CM

## 2024-04-02 DIAGNOSIS — R109 Unspecified abdominal pain: Secondary | ICD-10-CM

## 2024-04-02 DIAGNOSIS — R634 Abnormal weight loss: Secondary | ICD-10-CM | POA: Diagnosis not present

## 2024-04-02 DIAGNOSIS — D7389 Other diseases of spleen: Secondary | ICD-10-CM | POA: Diagnosis not present

## 2024-04-02 DIAGNOSIS — I10 Essential (primary) hypertension: Secondary | ICD-10-CM | POA: Diagnosis not present

## 2024-04-02 DIAGNOSIS — E782 Mixed hyperlipidemia: Secondary | ICD-10-CM | POA: Diagnosis not present

## 2024-04-02 LAB — CBC WITH DIFFERENTIAL/PLATELET
Basophils Absolute: 0 x10E3/uL (ref 0.0–0.2)
Basos: 0 %
EOS (ABSOLUTE): 0 x10E3/uL (ref 0.0–0.4)
Eos: 1 %
Hematocrit: 40.4 % (ref 37.5–51.0)
Hemoglobin: 13.5 g/dL (ref 13.0–17.7)
Lymphocytes Absolute: 1.1 x10E3/uL (ref 0.7–3.1)
Lymphs: 24 %
MCH: 35 pg — ABNORMAL HIGH (ref 26.6–33.0)
MCHC: 33.4 g/dL (ref 31.5–35.7)
MCV: 105 fL — ABNORMAL HIGH (ref 79–97)
Monocytes Absolute: 0.6 x10E3/uL (ref 0.1–0.9)
Monocytes: 14 %
Neutrophils Absolute: 2.9 x10E3/uL (ref 1.4–7.0)
Neutrophils: 61 %
Platelets: 300 x10E3/uL (ref 150–450)
RBC: 3.86 x10E6/uL — ABNORMAL LOW (ref 4.14–5.80)
RDW: 14.6 % (ref 11.6–15.4)
WBC: 4.7 x10E3/uL (ref 3.4–10.8)

## 2024-04-02 LAB — BASIC METABOLIC PANEL WITH GFR
BUN/Creatinine Ratio: 17 (ref 10–24)
BUN: 17 mg/dL (ref 8–27)
CO2: 25 mmol/L (ref 20–29)
Calcium: 9.1 mg/dL (ref 8.6–10.2)
Chloride: 103 mmol/L (ref 96–106)
Creatinine, Ser: 0.98 mg/dL (ref 0.76–1.27)
Glucose: 116 mg/dL — ABNORMAL HIGH (ref 70–99)
Potassium: 4.1 mmol/L (ref 3.5–5.2)
Sodium: 139 mmol/L (ref 134–144)
eGFR: 85 mL/min/1.73 (ref >59)

## 2024-04-02 LAB — PROTIME-INR
INR: 0.9 (ref 0.9–1.2)
Prothrombin Time: 10.6 s (ref 9.1–12.0)

## 2024-04-02 LAB — LIPASE: Lipase: 26 U/L (ref 13–78)

## 2024-04-02 MED ORDER — VITAMIN B-12 1000 MCG PO TABS: 1000.0000 ug | ORAL_TABLET | Freq: Every day | ORAL | 1 refills | Status: AC

## 2024-04-02 MED ORDER — IOPAMIDOL (ISOVUE-300) INJECTION 61%
100.0000 mL | Freq: Once | INTRAVENOUS | Status: AC | PRN
  Administered 2024-04-02: 100 mL via INTRAVENOUS

## 2024-04-02 MED ORDER — POLYETHYLENE GLYCOL 3350 17 GM/SCOOP PO POWD: 17.0000 g | Freq: Two times a day (BID) | ORAL | 1 refills | Status: AC | PRN

## 2024-04-02 MED ORDER — VITAMIN D (ERGOCALCIFEROL) 1.25 MG (50000 UNIT) PO CAPS: 50000.0000 [IU] | ORAL_CAPSULE | ORAL | 1 refills | Status: AC

## 2024-04-02 MED ORDER — AMLODIPINE BESYLATE 5 MG PO TABS: 5.0000 mg | ORAL_TABLET | Freq: Every day | ORAL | 2 refills | Status: AC

## 2024-04-02 MED ORDER — ATORVASTATIN CALCIUM 10 MG PO TABS: 10.0000 mg | ORAL_TABLET | Freq: Every day | ORAL | 2 refills | Status: DC

## 2024-04-02 NOTE — Progress Notes (Signed)
 The main finding today on your scan was a lot of stool in the colon suggesting constipation.  You have a small lesion of the spleen but the radiologist thinks is benign.  Your prostate gland is enlarged likely due to chronic enlargement.  You have abnormal appearing right head of femur and you have cholesterol bit up in your aorta  I sent MiraLAX  to your pharmacy.  Begin MiraLAX  powder 1 cap full in a beverage hourly for the next 2 to 3 hours.  This should help clear out stool and bloating and help relieve some of your symptoms.  Do not use this more than 3 doses today  Another option would be over-the-counter milk of magnesia if you do not want to do MiraLAX .  In general for constipation drink at least 80 to 100 ounces of water  daily.  Get fiber in the diet regularly.  Continue your current medications for cholesterol and blood pressure  We will make sure your orthopedic has a copy of the scan given the femur findings as well  There was no obvious hernia  I would like to see you back next week to make sure you are much improved and to discuss your prostate further  Expect a phone call about referral to cardiology

## 2024-04-02 NOTE — Progress Notes (Signed)
 Subjective:  LISTER BRIZZI is a 68 y.o. male who presents for Chief Complaint  Patient presents with   Medical Management of Chronic Issues    Med check, knee pain x 1 year on both knees     Here for med check.  Accompanied by fianc.  Last visit April 2025.  Initially scheduled as a med check today.  He has been having a lot of knee pains.  He saw orthopedics recently and they advised surgery.  Surgery was tentatively recommended for next week  However he says in the last month he has been having a lot of belly pain.  His fiance says it has been worse in the last few days including nausea, vomiting.  He feels like he has trapped gas in the abdomen.  He is having some bowel movements but feels like things are backed up  He thinks he may have a hernia in the left inguinal region.  No blood in the stool or urine.  No urinary complaint.  No fever.  No body aches or chills.  He drinks a few cans of alcohol daily.  He smokes a carton a week.  He reports compliance with blood pressure and cholesterol medication.  No other aggravating or relieving factors.    No other c/o.  Past Medical History:  Diagnosis Date   B12 deficiency    Elevated PSA 12/2023   Essential hypertension 05/30/2021   Hyperlipidemia    Smoker    Vitamin D  deficiency    Current Outpatient Medications on File Prior to Visit  Medication Sig Dispense Refill   acetaminophen  (TYLENOL ) 500 MG tablet Take 1 tablet (500 mg total) by mouth every 6 (six) hours as needed. 60 tablet 0   amLODipine  (NORVASC ) 5 MG tablet Take 1 tablet (5 mg total) by mouth daily. 90 tablet 0   atorvastatin  (LIPITOR) 10 MG tablet Take 1 tablet (10 mg total) by mouth daily. 90 tablet 0   cyanocobalamin  (VITAMIN B12) 1000 MCG tablet Take 1 tablet (1,000 mcg total) by mouth daily. 90 tablet 1   diclofenac  (VOLTAREN ) 75 MG EC tablet Take 1 tablet (75 mg total) by mouth 2 (two) times daily. 30 tablet 2   ibuprofen  (ADVIL ) 600 MG tablet Take 1  tablet (600 mg total) by mouth every 8 (eight) hours as needed. 20 tablet 0   traMADol  (ULTRAM ) 50 MG tablet Take 1-2 tablets (50-100 mg total) by mouth daily as needed. 20 tablet 0   Vitamin D , Ergocalciferol , (DRISDOL ) 1.25 MG (50000 UNIT) CAPS capsule Take 1 capsule (50,000 Units total) by mouth every 7 (seven) days. 12 capsule 1   naproxen  (NAPROSYN ) 500 MG tablet Take 1 tablet (500 mg total) by mouth 2 (two) times daily with a meal. (Patient not taking: Reported on 04/02/2024) 30 tablet 0   No current facility-administered medications on file prior to visit.     The following portions of the patient's history were reviewed and updated as appropriate: allergies, current medications, past family history, past medical history, past social history, past surgical history and problem list.  ROS Otherwise as in subjective above  Objective: BP 130/80   Pulse 90   Wt 113 lb 9.6 oz (51.5 kg)   SpO2 97%   BMI 16.78 kg/m   General appearance: alert, no distress, well developed, well nourished Neck: supple, no lymphadenopathy, no thyromegaly, no masses, no JVD or bruit Heart: RRR, normal S1, S2, no murmurs Lungs: CTA bilaterally, no wheezes, rhonchi, or rales Abdomen: +  bs, upper median surgical scar of the abdomen, quite tender throughout, guarding, seems to be distended a bit as well .  Unable to fully assess for mass or hepatomegaly due to the guarding and tenderness. Possible inguinal hernia on the left but unable to fully appreciate given the abdominal exam Pulses: 2+ radial pulses, 2+ pedal pulses, normal cap refill Ext: no edema   Assessment: Encounter Diagnoses  Name Primary?   Abdominal pain, unspecified abdominal location    Abdominal guarding    Nausea and vomiting, unspecified vomiting type    Weight loss    Smoker    Mixed hyperlipidemia    Essential hypertension    Abnormal EKG      Plan: We discussed symptoms and concerns.  I am quite concerned with his abdominal  tenderness guarding and distention.  He also has unexplained weight loss.  He declines going to the emergency department.  We will try to get him set up for stat CT abdomen pelvis now.  Stat labs as below.  When I saw him in April we referred to cardiology as he will need cardiology clearance for orthopedic surgery as well.  Unfortunately he still has not been able to see cardiology yet.  Looking in the chart record, referral coordinator has tried to make contact with him but there was not a lot of success getting the appointment set up.  We will again work to get him in with cardiology  Orthopedics is requiring surgery clearance  Keionte was seen today for medical management of chronic issues.  Diagnoses and all orders for this visit:  Abdominal pain, unspecified abdominal location -     CT ABDOMEN PELVIS W CONTRAST; Future -     CBC with Differential/Platelet -     Basic metabolic panel with GFR -     Lipase -     Protime-INR  Abdominal guarding -     CT ABDOMEN PELVIS W CONTRAST; Future -     CBC with Differential/Platelet -     Basic metabolic panel with GFR -     Lipase -     Protime-INR  Nausea and vomiting, unspecified vomiting type -     CT ABDOMEN PELVIS W CONTRAST; Future -     CBC with Differential/Platelet -     Basic metabolic panel with GFR -     Lipase -     Protime-INR  Weight loss -     CT ABDOMEN PELVIS W CONTRAST; Future -     CBC with Differential/Platelet -     Basic metabolic panel with GFR -     Lipase -     Protime-INR  Smoker  Mixed hyperlipidemia -     Ambulatory referral to Cardiology  Essential hypertension -     Ambulatory referral to Cardiology  Abnormal EKG -     Ambulatory referral to Cardiology    Follow up: Pending labs, stat CT

## 2024-04-05 ENCOUNTER — Other Ambulatory Visit: Payer: Self-pay | Admitting: Medical

## 2024-04-09 ENCOUNTER — Ambulatory Visit (INDEPENDENT_AMBULATORY_CARE_PROVIDER_SITE_OTHER): Admitting: Medical

## 2024-04-09 VITALS — BP 120/70 | HR 77 | Wt 110.2 lb

## 2024-04-09 DIAGNOSIS — F1090 Alcohol use, unspecified, uncomplicated: Secondary | ICD-10-CM | POA: Diagnosis not present

## 2024-04-09 DIAGNOSIS — E559 Vitamin D deficiency, unspecified: Secondary | ICD-10-CM | POA: Diagnosis not present

## 2024-04-09 DIAGNOSIS — R109 Unspecified abdominal pain: Secondary | ICD-10-CM | POA: Insufficient documentation

## 2024-04-09 DIAGNOSIS — I10 Essential (primary) hypertension: Secondary | ICD-10-CM

## 2024-04-09 DIAGNOSIS — E782 Mixed hyperlipidemia: Secondary | ICD-10-CM

## 2024-04-09 DIAGNOSIS — E538 Deficiency of other specified B group vitamins: Secondary | ICD-10-CM | POA: Diagnosis not present

## 2024-04-09 DIAGNOSIS — N400 Enlarged prostate without lower urinary tract symptoms: Secondary | ICD-10-CM | POA: Diagnosis not present

## 2024-04-09 DIAGNOSIS — M879 Osteonecrosis, unspecified: Secondary | ICD-10-CM | POA: Diagnosis not present

## 2024-04-09 DIAGNOSIS — R972 Elevated prostate specific antigen [PSA]: Secondary | ICD-10-CM | POA: Diagnosis not present

## 2024-04-09 DIAGNOSIS — I700 Atherosclerosis of aorta: Secondary | ICD-10-CM | POA: Insufficient documentation

## 2024-04-09 NOTE — Progress Notes (Signed)
 Subjective: Chief Complaint  Patient presents with   Consult    Discuss CT results   Here for 1 week follow-up.  I saw him last week for really bad abdominal pain, abdominal distention and concerns.  We also sent him for CT stat and labs.  His CT showed lots of fecal matter but no other worrisome finding to cause belly pain.  He says he feels like a new man today.  After that visit we had him do MiraLAX  multiple times that day and he said he had lots of stool to pass that day and felt much relief for the next 2 days.  He actually feels great today.  Here to discuss the scan results.  He is compliant with his medications  No other new complaints today  Past Medical History:  Diagnosis Date   B12 deficiency    Elevated PSA 12/2023   Essential hypertension 05/30/2021   Hyperlipidemia    Smoker    Vitamin D  deficiency    Current Outpatient Medications on File Prior to Visit  Medication Sig Dispense Refill   acetaminophen  (TYLENOL ) 500 MG tablet Take 1 tablet (500 mg total) by mouth every 6 (six) hours as needed. 60 tablet 0   amLODipine  (NORVASC ) 5 MG tablet Take 1 tablet (5 mg total) by mouth daily. 90 tablet 2   atorvastatin  (LIPITOR) 10 MG tablet Take 1 tablet (10 mg total) by mouth daily. 90 tablet 2   cyanocobalamin  (VITAMIN B12) 1000 MCG tablet Take 1 tablet (1,000 mcg total) by mouth daily. 90 tablet 1   diclofenac  (VOLTAREN ) 75 MG EC tablet Take 1 tablet (75 mg total) by mouth 2 (two) times daily. 30 tablet 2   ibuprofen  (ADVIL ) 600 MG tablet Take 1 tablet (600 mg total) by mouth every 8 (eight) hours as needed. 20 tablet 0   polyethylene glycol powder (GLYCOLAX /MIRALAX ) 17 GM/SCOOP powder Take 17 g by mouth 2 (two) times daily as needed. 3350 g 1   traMADol  (ULTRAM ) 50 MG tablet Take 1-2 tablets (50-100 mg total) by mouth daily as needed. 20 tablet 0   Vitamin D , Ergocalciferol , (DRISDOL ) 1.25 MG (50000 UNIT) CAPS capsule Take 1 capsule (50,000 Units total) by mouth every 7  (seven) days. 12 capsule 1   No current facility-administered medications on file prior to visit.   Past Surgical History:  Procedure Laterality Date   PUD repair     REPAIR OF PERFORATED ULCER      ROS as in subjective   Objetive: BP 120/70   Pulse 77   Wt 110 lb 3.2 oz (50 kg)   SpO2 98%   BMI 16.27 kg/m   Wt Readings from Last 3 Encounters:  04/09/24 110 lb 3.2 oz (50 kg)  04/02/24 113 lb 9.6 oz (51.5 kg)  01/02/24 116 lb 6.4 oz (52.8 kg)   General appearence: alert, no distress, WD/WN,  Abdomen: +bs, soft, midline vertical surgical scar, no distention today, nontender, no masses, no hepatomegaly, no splenomegaly Pulses: 2+ symmetric, upper and lower extremities, normal cap refill Right hip range of motion is relatively normal without obvious pain   Assessment: Encounter Diagnoses  Name Primary?   Abdominal pain, unspecified abdominal location Yes   Enlarged prostate    Elevated PSA    Essential hypertension    Mixed hyperlipidemia    Vitamin B 12 deficiency    Vitamin D  deficiency    Alcohol use disorder    Osteonecrosis of right hip (HCC)    Aortic  atherosclerosis (HCC)       Plan Glad to hear he is much improved from last week.  He apparently was really full of stool.  He had really volume this good bowel movements in the last week and resolved his symptoms.  His exam is much different today.  We discussed food choices and avoiding constipation.  Apparently he was eating quite a bit of cheese on a regular basis  Elevated PSA on labs earlier in the year but stable from 2 or 3 years ago.  Last PSA 4.5.  He will return in the next 4 to 5 weeks to do fasting labs.  He was pressed for time today so he did not do labs today.  We will recheck PSA lab in a few weeks, and will likely go ahead and refer to urology pending that lab.  We discussed possible significance of elevated PSA  Hypertension-continue amlodipine  5 mg daily  Hyperlipidemia-continue atorvastatin   10 mg daily.  Return soon for fasting labs  B12 deficiency-continue B12 supplement  Vitamin D  deficiency-continue vitamin D  supplement  Alcohol use disorder-counseled on limiting alcohol  Osteonecrosis of right hip on recent scans-he sees orthopedics and is having knee surgery soon.  We already sent them imaging results to be aware with this finding.  He has no hip pain or pelvis pain and has fairly good range of motion of the hip  Aortic atherosclerosis on recent scan-continue statin.  Will likely increase to 20 mg on lab check in a couple weeks  prostate enlarged on scan from last week-she has some urinary hesitancy but no nocturia of significance   Travis Lutz was seen today for consult.  Diagnoses and all orders for this visit:  Abdominal pain, unspecified abdominal location -     Hepatic function panel; Future  Enlarged prostate -     PSA, total and free; Future  Elevated PSA -     PSA, total and free; Future  Essential hypertension  Mixed hyperlipidemia -     Lipid panel; Future -     Hepatic function panel; Future  Vitamin B 12 deficiency  Vitamin D  deficiency  Alcohol use disorder  Osteonecrosis of right hip (HCC)  Aortic atherosclerosis (HCC)     F/u 6 wk fasting

## 2024-04-11 NOTE — Telephone Encounter (Signed)
-----   Message from Ludie Gent sent at 04/11/2024  8:05 AM EDT ----- I placed referral to cardiology at his visit previous to the last visit  This is in part for surgery clearance was not sure if it could be moved up any since it is over a month away?

## 2024-04-21 NOTE — Progress Notes (Deleted)
  Cardiology Office Note:  .   Date:  04/21/2024  ID:  Travis Lutz, DOB [birth year], MRN [id number] PCP: Bulah Alm GORMAN DEVONNA  [facility] Providers Cardiologist:  None { Click to update primary MD,subspecialty MD or APP then REFRESH:1}  History of Present Illness: .   Travis Lutz is a 68 y.o. patient referred to me for evaluation of abdominal aortic atherosclerosis and cardiac risk stratification.  Past medical history significant for hypertension, hypercholesterolemia, excess alcohol use disorder.  Cardiac Studies relevent.    CT chest for lung cancer screening 06/20/2021: Atherosclerotic classification of the aorta, aortic valve, coronary arteries. Centrilobular emphysema. Left adrenal adenoma.  CT of the abdomen pelvis 04/02/2024:  Aortic and branch vessel atherosclerosis.  Discussed the use of AI scribe software for clinical note transcription with the patient, who gave verbal consent to proceed.  History of Present Illness    Labs   Lab Results  Component Value Date   CHOL 289 (H) 05/05/2021   HDL 71 05/05/2021   LDLCALC 209 (H) 05/05/2021   TRIG 64 05/05/2021   CHOLHDL 4.1 05/05/2021   No results found for: LIPOA  Recent Labs    12/19/23 1433 04/02/24 1140  NA 138 139  K 3.9 4.1  CL 102 103  CO2 24 25  GLUCOSE 99 116*  BUN 14 17  CREATININE 0.86 0.98  CALCIUM  9.2 9.1  GFRNONAA >60  --     Lab Results  Component Value Date   ALT 18 12/19/2023   AST 27 12/19/2023   ALKPHOS 73 12/19/2023   BILITOT 1.1 12/19/2023      Latest Ref Rng & Units 04/02/2024   11:40 AM 12/19/2023    2:33 PM 04/06/2022   10:40 AM  CBC  WBC 3.4 - 10.8 x10E3/uL 4.7  4.8  5.1   Hemoglobin 13.0 - 17.7 g/dL 86.4  84.1  86.4   Hematocrit 37.5 - 51.0 % 40.4  46.9  40.3   Platelets 150 - 450 x10E3/uL 300  320  301    Lab Results  Component Value Date   HGBA1C 5.1 11/15/2015    Lab Results  Component Value Date   TSH 0.766 01/02/2024    ROS   ***ROS Physical Exam:   VS:  There were no vitals taken for this visit.   Wt Readings from Last 3 Encounters:  04/09/24 110 lb 3.2 oz (50 kg)  04/02/24 113 lb 9.6 oz (51.5 kg)  01/02/24 116 lb 6.4 oz (52.8 kg)    BP Readings from Last 3 Encounters:  04/09/24 120/70  04/02/24 130/80  01/02/24 130/80   ***Physical Exam EKG:         ASSESSMENT AND PLAN: .      ICD-10-CM   1. Coronary artery calcification seen on CAT scan  I25.10     2. Primary hypertension  I10     3. Pure hypercholesterolemia  E78.00     4. Alcohol use disorder  F10.90      Assessment & Plan    Follow up: ***  Signed,  Gordy Bergamo, MD, [facility] 04/21/2024, 10:09 PM [facility] [address] [place], [place] [address] Phone: [phone]. Fax:  [phone]

## 2024-04-22 ENCOUNTER — Ambulatory Visit: Admitting: Cardiology

## 2024-04-22 DIAGNOSIS — I2510 Atherosclerotic heart disease of native coronary artery without angina pectoris: Secondary | ICD-10-CM

## 2024-04-22 DIAGNOSIS — E7800 Pure hypercholesterolemia, unspecified: Secondary | ICD-10-CM

## 2024-04-22 DIAGNOSIS — I10 Essential (primary) hypertension: Secondary | ICD-10-CM

## 2024-04-22 DIAGNOSIS — F1090 Alcohol use, unspecified, uncomplicated: Secondary | ICD-10-CM

## 2024-05-10 ENCOUNTER — Encounter (HOSPITAL_COMMUNITY): Payer: Self-pay

## 2024-05-10 ENCOUNTER — Inpatient Hospital Stay (HOSPITAL_COMMUNITY)
Admission: EM | Admit: 2024-05-10 | Discharge: 2024-05-13 | DRG: 184 | Disposition: A | Discharge status: Home / Self Care | Attending: General Surgery | Admitting: General Surgery

## 2024-05-10 ENCOUNTER — Other Ambulatory Visit: Payer: Self-pay

## 2024-05-10 ENCOUNTER — Emergency Department (HOSPITAL_COMMUNITY)

## 2024-05-10 ENCOUNTER — Inpatient Hospital Stay (HOSPITAL_COMMUNITY)

## 2024-05-10 DIAGNOSIS — S42022A Displaced fracture of shaft of left clavicle, initial encounter for closed fracture: Secondary | ICD-10-CM | POA: Diagnosis present

## 2024-05-10 DIAGNOSIS — S3991XA Unspecified injury of abdomen, initial encounter: Secondary | ICD-10-CM | POA: Diagnosis not present

## 2024-05-10 DIAGNOSIS — F17210 Nicotine dependence, cigarettes, uncomplicated: Secondary | ICD-10-CM | POA: Diagnosis present

## 2024-05-10 DIAGNOSIS — S0990XA Unspecified injury of head, initial encounter: Secondary | ICD-10-CM | POA: Diagnosis not present

## 2024-05-10 DIAGNOSIS — S3993XA Unspecified injury of pelvis, initial encounter: Secondary | ICD-10-CM | POA: Diagnosis not present

## 2024-05-10 DIAGNOSIS — M25519 Pain in unspecified shoulder: Secondary | ICD-10-CM | POA: Diagnosis not present

## 2024-05-10 DIAGNOSIS — S2242XA Multiple fractures of ribs, left side, initial encounter for closed fracture: Principal | ICD-10-CM | POA: Diagnosis present

## 2024-05-10 DIAGNOSIS — I10 Essential (primary) hypertension: Secondary | ICD-10-CM | POA: Diagnosis present

## 2024-05-10 DIAGNOSIS — R29702 NIHSS score 2: Secondary | ICD-10-CM | POA: Diagnosis not present

## 2024-05-10 DIAGNOSIS — T07XXXA Unspecified multiple injuries, initial encounter: Principal | ICD-10-CM

## 2024-05-10 DIAGNOSIS — R569 Unspecified convulsions: Secondary | ICD-10-CM | POA: Diagnosis not present

## 2024-05-10 DIAGNOSIS — Y92410 Unspecified street and highway as the place of occurrence of the external cause: Secondary | ICD-10-CM

## 2024-05-10 DIAGNOSIS — R918 Other nonspecific abnormal finding of lung field: Secondary | ICD-10-CM | POA: Diagnosis not present

## 2024-05-10 DIAGNOSIS — R410 Disorientation, unspecified: Secondary | ICD-10-CM | POA: Diagnosis not present

## 2024-05-10 DIAGNOSIS — R0789 Other chest pain: Secondary | ICD-10-CM | POA: Diagnosis not present

## 2024-05-10 DIAGNOSIS — S27321A Contusion of lung, unilateral, initial encounter: Secondary | ICD-10-CM | POA: Diagnosis present

## 2024-05-10 DIAGNOSIS — Z79899 Other long term (current) drug therapy: Secondary | ICD-10-CM | POA: Diagnosis not present

## 2024-05-10 DIAGNOSIS — R29818 Other symptoms and signs involving the nervous system: Secondary | ICD-10-CM | POA: Diagnosis not present

## 2024-05-10 DIAGNOSIS — S42002A Fracture of unspecified part of left clavicle, initial encounter for closed fracture: Secondary | ICD-10-CM | POA: Diagnosis not present

## 2024-05-10 DIAGNOSIS — J439 Emphysema, unspecified: Secondary | ICD-10-CM | POA: Diagnosis not present

## 2024-05-10 DIAGNOSIS — R0602 Shortness of breath: Secondary | ICD-10-CM | POA: Diagnosis not present

## 2024-05-10 DIAGNOSIS — I6381 Other cerebral infarction due to occlusion or stenosis of small artery: Secondary | ICD-10-CM | POA: Diagnosis not present

## 2024-05-10 DIAGNOSIS — S42025A Nondisplaced fracture of shaft of left clavicle, initial encounter for closed fracture: Secondary | ICD-10-CM | POA: Diagnosis not present

## 2024-05-10 HISTORY — DX: Essential (primary) hypertension: I10

## 2024-05-10 LAB — URINALYSIS, ROUTINE W REFLEX MICROSCOPIC
Bacteria, UA: NONE SEEN
Bilirubin Urine: NEGATIVE
Glucose, UA: NEGATIVE mg/dL
Ketones, ur: NEGATIVE mg/dL
Leukocytes,Ua: NEGATIVE
Nitrite: NEGATIVE
Protein, ur: NEGATIVE mg/dL
Specific Gravity, Urine: 1.025 (ref 1.005–1.030)
pH: 7 (ref 5.0–8.0)

## 2024-05-10 LAB — COMPREHENSIVE METABOLIC PANEL WITH GFR
ALT: 27 U/L (ref 0–44)
AST: 34 U/L (ref 15–41)
Albumin: 3.8 g/dL (ref 3.5–5.0)
Alkaline Phosphatase: 71 U/L (ref 38–126)
Anion gap: 11 (ref 5–15)
BUN: 18 mg/dL (ref 8–23)
CO2: 23 mmol/L (ref 22–32)
Calcium: 8.9 mg/dL (ref 8.9–10.3)
Chloride: 106 mmol/L (ref 98–111)
Creatinine, Ser: 0.98 mg/dL (ref 0.61–1.24)
GFR, Estimated: >60 mL/min (ref >60)
Glucose, Bld: 92 mg/dL (ref 70–99)
Potassium: 4.1 mmol/L (ref 3.5–5.1)
Sodium: 140 mmol/L (ref 135–145)
Total Bilirubin: 0.5 mg/dL (ref 0.0–1.2)
Total Protein: 7.2 g/dL (ref 6.5–8.1)

## 2024-05-10 LAB — BASIC METABOLIC PANEL WITH GFR
Anion gap: 17 — ABNORMAL HIGH (ref 5–15)
BUN: 13 mg/dL (ref 8–23)
CO2: 17 mmol/L — ABNORMAL LOW (ref 22–32)
Calcium: 9.4 mg/dL (ref 8.9–10.3)
Chloride: 112 mmol/L — ABNORMAL HIGH (ref 98–111)
Creatinine, Ser: 0.89 mg/dL (ref 0.61–1.24)
GFR, Estimated: >60 mL/min (ref >60)
Glucose, Bld: 124 mg/dL — ABNORMAL HIGH (ref 70–99)
Potassium: 4.5 mmol/L (ref 3.5–5.1)
Sodium: 146 mmol/L — ABNORMAL HIGH (ref 135–145)

## 2024-05-10 LAB — CBC
HCT: 41.9 % (ref 39.0–52.0)
HCT: 45 % (ref 39.0–52.0)
Hemoglobin: 14.2 g/dL (ref 13.0–17.0)
Hemoglobin: 15 g/dL (ref 13.0–17.0)
MCH: 35.8 pg — ABNORMAL HIGH (ref 26.0–34.0)
MCH: 35.5 pg — ABNORMAL HIGH (ref 26.0–34.0)
MCHC: 33.9 g/dL (ref 30.0–36.0)
MCHC: 33.3 g/dL (ref 30.0–36.0)
MCV: 105.5 fL — ABNORMAL HIGH (ref 80.0–100.0)
MCV: 106.6 fL — ABNORMAL HIGH (ref 80.0–100.0)
Platelets: 328 K/uL (ref 150–400)
Platelets: 279 K/uL (ref 150–400)
RBC: 3.97 MIL/uL — ABNORMAL LOW (ref 4.22–5.81)
RBC: 4.22 MIL/uL (ref 4.22–5.81)
RDW: 14.5 % (ref 11.5–15.5)
RDW: 14.6 % (ref 11.5–15.5)
WBC: 7 K/uL (ref 4.0–10.5)
WBC: 11.8 K/uL — ABNORMAL HIGH (ref 4.0–10.5)
nRBC: 0 % (ref 0.0–0.2)
nRBC: 0 % (ref 0.0–0.2)

## 2024-05-10 LAB — I-STAT CHEM 8, ED
BUN: 26 mg/dL — ABNORMAL HIGH (ref 8–23)
Calcium, Ion: 1 mmol/L — ABNORMAL LOW (ref 1.15–1.40)
Chloride: 112 mmol/L — ABNORMAL HIGH (ref 98–111)
Creatinine, Ser: 0.9 mg/dL (ref 0.61–1.24)
Glucose, Bld: 87 mg/dL (ref 70–99)
HCT: 42 % (ref 39.0–52.0)
Hemoglobin: 14.3 g/dL (ref 13.0–17.0)
Potassium: 8 mmol/L (ref 3.5–5.1)
Sodium: 141 mmol/L (ref 135–145)
TCO2: 23 mmol/L (ref 22–32)

## 2024-05-10 LAB — I-STAT CG4 LACTIC ACID, ED: Lactic Acid, Venous: 2.3 mmol/L (ref 0.5–1.9)

## 2024-05-10 LAB — ETHANOL: Alcohol, Ethyl (B): 41 mg/dL — ABNORMAL HIGH (ref <15)

## 2024-05-10 LAB — SAMPLE TO BLOOD BANK

## 2024-05-10 LAB — HIV ANTIBODY (ROUTINE TESTING W REFLEX): HIV Screen 4th Generation wRfx: NONREACTIVE

## 2024-05-10 LAB — PROTIME-INR
INR: 1 (ref 0.8–1.2)
Prothrombin Time: 14.1 s (ref 11.4–15.2)

## 2024-05-10 MED ORDER — METOPROLOL TARTRATE 5 MG/5ML IV SOLN
5.0000 mg | Freq: Four times a day (QID) | INTRAVENOUS | Status: DC | PRN
  Administered 2024-05-11: 5 mg via INTRAVENOUS
  Filled 2024-05-10 (×2): qty 5

## 2024-05-10 MED ORDER — ONDANSETRON HCL 4 MG/2ML IJ SOLN
4.0000 mg | Freq: Four times a day (QID) | INTRAMUSCULAR | Status: DC | PRN
  Administered 2024-05-10 – 2024-05-12 (×2): 4 mg via INTRAVENOUS
  Filled 2024-05-10 (×2): qty 2

## 2024-05-10 MED ORDER — IOHEXOL 350 MG/ML SOLN
75.0000 mL | Freq: Once | INTRAVENOUS | Status: AC | PRN
  Administered 2024-05-10: 75 mL via INTRAVENOUS

## 2024-05-10 MED ORDER — DEXTROSE IN LACTATED RINGERS 5 % IV SOLN: INTRAVENOUS | Status: AC

## 2024-05-10 MED ORDER — ENOXAPARIN SODIUM 30 MG/0.3ML IJ SOSY
30.0000 mg | PREFILLED_SYRINGE | Freq: Two times a day (BID) | INTRAMUSCULAR | Status: DC
  Administered 2024-05-11 – 2024-05-13 (×5): 30 mg via SUBCUTANEOUS
  Filled 2024-05-10 (×5): qty 0.3

## 2024-05-10 MED ORDER — MORPHINE SULFATE (PF) 2 MG/ML IV SOLN
2.0000 mg | INTRAVENOUS | Status: DC | PRN
  Administered 2024-05-10: 4 mg via INTRAVENOUS
  Administered 2024-05-11 (×3): 2 mg via INTRAVENOUS
  Administered 2024-05-11 – 2024-05-12 (×2): 4 mg via INTRAVENOUS
  Filled 2024-05-10: qty 2
  Filled 2024-05-10 (×2): qty 1
  Filled 2024-05-10: qty 2
  Filled 2024-05-10: qty 1
  Filled 2024-05-10: qty 2

## 2024-05-10 MED ORDER — HYDROCODONE-ACETAMINOPHEN 5-325 MG PO TABS
2.0000 | ORAL_TABLET | ORAL | Status: DC | PRN
  Administered 2024-05-10 – 2024-05-12 (×9): 2 via ORAL
  Filled 2024-05-10 (×9): qty 2

## 2024-05-10 MED ORDER — GABAPENTIN 300 MG PO CAPS
300.0000 mg | ORAL_CAPSULE | Freq: Three times a day (TID) | ORAL | Status: DC
  Administered 2024-05-10 – 2024-05-12 (×7): 300 mg via ORAL
  Filled 2024-05-10: qty 3
  Filled 2024-05-10 (×6): qty 1

## 2024-05-10 MED ORDER — ACETAMINOPHEN 500 MG PO TABS
1000.0000 mg | ORAL_TABLET | Freq: Four times a day (QID) | ORAL | Status: DC
  Administered 2024-05-10 – 2024-05-13 (×7): 1000 mg via ORAL
  Filled 2024-05-10 (×8): qty 2

## 2024-05-10 MED ORDER — FENTANYL CITRATE PF 50 MCG/ML IJ SOSY
50.0000 ug | PREFILLED_SYRINGE | Freq: Once | INTRAMUSCULAR | Status: AC
  Administered 2024-05-10: 50 ug via INTRAVENOUS

## 2024-05-10 MED ORDER — METHOCARBAMOL 500 MG PO TABS
500.0000 mg | ORAL_TABLET | Freq: Three times a day (TID) | ORAL | Status: AC
  Administered 2024-05-10 – 2024-05-12 (×5): 500 mg via ORAL
  Filled 2024-05-10 (×5): qty 1

## 2024-05-10 MED ORDER — METHOCARBAMOL 1000 MG/10ML IJ SOLN
500.0000 mg | Freq: Three times a day (TID) | INTRAMUSCULAR | Status: AC
  Administered 2024-05-10 – 2024-05-12 (×4): 500 mg via INTRAVENOUS
  Filled 2024-05-10 (×4): qty 10

## 2024-05-10 MED ORDER — TRAMADOL HCL 50 MG PO TABS
25.0000 mg | ORAL_TABLET | Freq: Four times a day (QID) | ORAL | Status: DC | PRN
  Administered 2024-05-10 – 2024-05-12 (×3): 25 mg via ORAL
  Filled 2024-05-10 (×4): qty 1

## 2024-05-10 MED ORDER — HYDRALAZINE HCL 20 MG/ML IJ SOLN
10.0000 mg | INTRAMUSCULAR | Status: DC | PRN
  Administered 2024-05-10: 10 mg via INTRAVENOUS
  Filled 2024-05-10 (×2): qty 1

## 2024-05-10 MED ORDER — DOCUSATE SODIUM 100 MG PO CAPS
100.0000 mg | ORAL_CAPSULE | Freq: Two times a day (BID) | ORAL | Status: DC
  Administered 2024-05-10 – 2024-05-12 (×5): 100 mg via ORAL
  Filled 2024-05-10 (×5): qty 1

## 2024-05-10 MED ORDER — FENTANYL CITRATE PF 50 MCG/ML IJ SOSY
PREFILLED_SYRINGE | INTRAMUSCULAR | Status: AC
Start: 2024-05-10 — End: 2024-05-10
  Filled 2024-05-10: qty 1

## 2024-05-10 MED ORDER — POLYETHYLENE GLYCOL 3350 17 G PO PACK
17.0000 g | PACK | Freq: Every day | ORAL | Status: DC | PRN
Start: 2024-05-10 — End: 2024-05-13
  Administered 2024-05-11: 17 g via ORAL
  Filled 2024-05-10 (×2): qty 1

## 2024-05-10 MED ORDER — ONDANSETRON 4 MG PO TBDP
4.0000 mg | ORAL_TABLET | Freq: Four times a day (QID) | ORAL | Status: DC | PRN
Start: 2024-05-10 — End: 2024-05-13
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
## 2024-05-10 NOTE — Progress Notes (Addendum)
 Florence Pride, Trauma Nurse regarding wife's concern of not knowing the plan of care for patient as she has not spoken to doctors, patient is also spitting bloody sputum at the moment in which Pride was made aware of and he said he will check once he is done attending to a patient.  2032-paged Dr. Rubin to inform about wife's concern of not being able to talk to the doctor and the bloody sputum.  2033- Dr. Rubin called and wanted to speak via nurse's phone to wife but wife is already out of the room, he said bloody sputum is expected given patient's trauma and that patient can resume diet of regular diet now as he will not be undergoing any surgical intervention. He is not able to call wife's personal number at the moment.  2045-patient's partner, Erwin, updated about treatment plan.

## 2024-05-10 NOTE — ED Notes (Signed)
 Pt returned from CT
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
## 2024-05-10 NOTE — Progress Notes (Signed)
 PT Cancellation Note  Patient Details Name: Travis Lutz MRN: [id number] DOB: 05/19/1956   Cancelled Treatment:    Reason Eval/Treat Not Completed: Patient not medically ready. Noted pt's potassium level is currently 8.0 mmol/L. Will hold PT evaluation at this time and check back as schedule allows for updated labs and assess readiness to participate.    Cyara Devoto D Leighanne Adolph 05/10/2024, 10:12 AM  [name], PT, DPT Acute Rehabilitation Services Secure Chat Preferred Office: [phone]
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
## 2024-05-10 NOTE — Plan of Care (Signed)
  Problem: Pain Managment: Goal: General experience of comfort will improve and/or be controlled Outcome: Progressing   Problem: Safety: Goal: Ability to remain free from injury will improve Outcome: Progressing   Problem: Skin Integrity: Goal: Risk for impaired skin integrity will decrease Outcome: Progressing
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
## 2024-05-10 NOTE — ED Notes (Signed)
 Need to collect 2 pink tops for blood bank.  KM
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
## 2024-05-10 NOTE — ED Notes (Signed)
 Patient transported to CT by [facility]  TRN

## 2024-05-10 NOTE — Plan of Care (Signed)

## 2024-05-10 NOTE — Progress Notes (Signed)
 OT Cancellation Note  Patient Details Name: Travis Lutz MRN: [id number] DOB: Jan 20, 1956   Cancelled Treatment:    Reason Eval/Treat Not Completed: Patient not medically ready (Pt with critical Potassium lab value of 8.0 mmol/L this morning. Per Acute Rehab Department policy, OT to hold acute OT eval at this time. OT to reattempt to see pt at a later time as appropriate/available.)  [name], OTR/L, MA Acute Rehab [phone]   Margarie FORBES Horns 05/10/2024, 10:05 AM

## 2024-05-10 NOTE — Progress Notes (Signed)
 Transition of Care [facility]) - CAGE-AID Screening   Patient Details  Name: Travis Lutz MRN: [id number] Date of Birth: [birth year]  [facility] CM/SW Contact:    Sallyanne MALVA Mettle, RN Phone Number: 05/10/2024, 5:00 AM   Clinical Narrative:  Pt denies alcohol and drug use. +ETOH on arrival.   CAGE-AID Screening:    Have You Ever Felt You Ought to Cut Down on Your Drinking or Drug Use?: No Have People Annoyed You By Critizing Your Drinking Or Drug Use?: No Have You Felt Bad Or Guilty About Your Drinking Or Drug Use?: No Have You Ever Had a Drink or Used Drugs First Thing In The Morning to Steady Your Nerves or to Get Rid of a Hangover?: No CAGE-AID Score: 0  Substance Abuse Education Offered: No

## 2024-05-11 ENCOUNTER — Inpatient Hospital Stay (HOSPITAL_COMMUNITY)

## 2024-05-11 ENCOUNTER — Ambulatory Visit (HOSPITAL_COMMUNITY)

## 2024-05-11 DIAGNOSIS — I6381 Other cerebral infarction due to occlusion or stenosis of small artery: Secondary | ICD-10-CM

## 2024-05-11 DIAGNOSIS — R29702 NIHSS score 2: Secondary | ICD-10-CM

## 2024-05-11 DIAGNOSIS — R569 Unspecified convulsions: Secondary | ICD-10-CM | POA: Diagnosis not present

## 2024-05-11 LAB — GLUCOSE, CAPILLARY: Glucose-Capillary: 133 mg/dL — ABNORMAL HIGH (ref 70–99)

## 2024-05-11 MED ORDER — THIAMINE MONONITRATE 100 MG PO TABS
100.0000 mg | ORAL_TABLET | Freq: Every day | ORAL | Status: DC
  Administered 2024-05-11 – 2024-05-13 (×3): 100 mg via ORAL
  Filled 2024-05-11 (×3): qty 1

## 2024-05-11 MED ORDER — LORAZEPAM 2 MG/ML IJ SOLN: 1.0000 mg | INTRAMUSCULAR | Status: DC | PRN

## 2024-05-11 MED ORDER — ADULT MULTIVITAMIN W/MINERALS CH
1.0000 | ORAL_TABLET | Freq: Every day | ORAL | Status: DC
  Administered 2024-05-11 – 2024-05-13 (×3): 1 via ORAL
  Filled 2024-05-11 (×3): qty 1

## 2024-05-11 MED ORDER — FENTANYL CITRATE PF 50 MCG/ML IJ SOSY: 25.0000 ug | PREFILLED_SYRINGE | Freq: Once | INTRAMUSCULAR | Status: AC

## 2024-05-11 MED ORDER — THIAMINE HCL 100 MG/ML IJ SOLN: 100.0000 mg | Freq: Every day | INTRAMUSCULAR | Status: DC

## 2024-05-11 MED ORDER — BISACODYL 10 MG RE SUPP
10.0000 mg | Freq: Every day | RECTAL | Status: DC | PRN
  Administered 2024-05-11: 10 mg via RECTAL
  Filled 2024-05-11: qty 1

## 2024-05-11 MED ORDER — FOLIC ACID 1 MG PO TABS
1.0000 mg | ORAL_TABLET | Freq: Every day | ORAL | Status: DC
  Administered 2024-05-11 – 2024-05-13 (×3): 1 mg via ORAL
  Filled 2024-05-11 (×3): qty 1

## 2024-05-11 MED ORDER — FENTANYL CITRATE PF 50 MCG/ML IJ SOSY
PREFILLED_SYRINGE | INTRAMUSCULAR | Status: AC
  Administered 2024-05-11: 25 ug via INTRAVENOUS
  Filled 2024-05-11: qty 1

## 2024-05-11 MED ORDER — LORAZEPAM 1 MG PO TABS
1.0000 mg | ORAL_TABLET | ORAL | Status: DC | PRN
  Administered 2024-05-11 (×2): 2 mg via ORAL
  Filled 2024-05-11 (×2): qty 2
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
## 2024-05-11 NOTE — Procedures (Signed)
 Patient Name: Travis Lutz  MRN: [id number]  Epilepsy Attending: Arlin MALVA Krebs  Referring Physician/Provider: Judithe Rocky BROCKS, NP  Date: 05/11/2024  Duration: 22.24 mins  Patient history: 68y M patient was noted by RN to have approximately 5 minutes of altered mental status, decreased responsiveness this morning after asking for pain medication. She states that she was asking him his name and other orientation questions without any response from him. He then began to make repetitive grunts/noises, increased with pain. EEG to evaluate for seizure.  Level of alertness: Awake, asleep  AEDs during EEG study: GBP  Technical aspects: This EEG study was done with scalp electrodes positioned according to the 10-20 International system of electrode placement. Electrical activity was reviewed with band pass filter of 1-70Hz , sensitivity of 7 uV/mm, display speed of 69mm/sec with a 60Hz  notched filter applied as appropriate. EEG data were recorded continuously and digitally stored.  Video monitoring was available and reviewed as appropriate.  Description: Sleep was characterized by vertex waves, sleep spindles (12 to 14 Hz), maximal frontocentral region. There is an excessive amount of 15 to 18 Hz beta activity distributed symmetrically and diffusely. Hyperventilation and photic stimulation were not performed.     ABNORMALITY - Excessive beta, generalized  IMPRESSION: This study is within normal limits. The excessive beta activity seen in the background is most likely due to the effect of medications like benzodiazepine and is a benign EEG pattern. No seizures or epileptiform discharges were seen throughout the recording.  A normal interictal EEG does not exclude the diagnosis of epilepsy.   Jannelle Notaro O Mahasin Riviere

## 2024-05-11 NOTE — Progress Notes (Signed)
 Routine EEG complete. Results pending.
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
## 2024-05-11 NOTE — Progress Notes (Signed)
 In to see patient this morning for assessment. Patient initially asked for pain medication. When asking patient's name and orientation questions, patient no longer responding verbally. Asked patient to squeeze my fingers, was able to do on both sides very weakly. Eyes able to track my finger. Pupils size 2, equal, round and reactive. MD Byerly paged. New order for STAT CT of head and call to Trauma nurse to come to bedside-TRN initiating code stroke after assessment.  BP 171/86, HR 75, RR 18, 100% on [place] 3L.

## 2024-05-11 NOTE — Progress Notes (Signed)
 Patient arrived to room 4 on 4NP via bed. Pt immediately wanting to get up out of bed and repetitively groaning/moaning. Pt stood at bedside for a few minutes and then walked with holding this nurse's hand to chair. Pt got a bit more comfortable & seemed to fall asleep. Pt alternating between the repetitive groaning/restlessness and sleeping.  Blood pressure has been elevated since admission with wife noting that he has a prescription for a blood pressure medication but can't remember the name. 2 person skin check with Tomeka. Abrasions noted to left side.  Patient sitting up in chair, call light in reach, wife in room.

## 2024-05-11 NOTE — Progress Notes (Signed)
 PT Cancellation Note  Patient Details Name: Travis Lutz MRN: [id number] DOB: 10/04/1955   Cancelled Treatment:    Reason Eval/Treat Not Completed: Patient not medically ready. Per note in chart and conversation with RN, code stroke activated this morning. Pt transported to CT and now with plan to transfer to a different unit. RN requesting PT hold eval at this time. Will continue to follow and initiate PT evaluation when pt is medically ready to participate.    [name] 05/11/2024, 12:52 PM  [name], PT, DPT Acute Rehabilitation Services Secure Chat Preferred Office: [phone]

## 2024-05-11 NOTE — Plan of Care (Signed)
  Problem: Activity: Goal: Risk for activity intolerance will decrease Outcome: Not Progressing   Problem: Clinical Measurements: Goal: Respiratory complications will improve Outcome: Not Progressing   Problem: Clinical Measurements: Goal: Diagnostic test results will improve Outcome: Not Progressing   Problem: Elimination: Goal: Will not experience complications related to bowel motility Outcome: Not Progressing   Problem: Safety: Goal: Ability to remain free from injury will improve Outcome: Not Progressing   Problem: Skin Integrity: Goal: Risk for impaired skin integrity will decrease Outcome: Not Progressing

## 2024-05-11 NOTE — Progress Notes (Signed)
 Orthopedic Tech Progress Note Patient Details:  Travis Lutz [birth year] [id number]  Ortho Devices Type of Ortho Device: Arm sling Ortho Device/Splint Location: For LUE, at bedside as pt is resting in recliner and would like it applied later Ortho Device/Splint Interventions: Ordered   Post Interventions Instructions Provided: Care of device, Adjustment of device  [name] 05/11/2024, 4:31 PM

## 2024-05-11 NOTE — Progress Notes (Signed)
 OT Cancellation Note  Patient Details Name: Travis Lutz MRN: [id number] DOB: [birth year]   Cancelled Treatment:    Reason Eval/Treat Not Completed: Patient not medically ready (Per note in chart and conversation with RN, code stroke activated this morning with pt transported to CT and now with plan to transfer pt to a different unit with RN requesting holding OT eval at this time. OT to reattempt later as appropriate/available.)  [name], OTR/L, MA Acute Rehab [phone]   Margarie FORBES Horns 05/11/2024, 10:02 AM
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
## 2024-05-12 ENCOUNTER — Inpatient Hospital Stay (HOSPITAL_COMMUNITY)

## 2024-05-12 MED ORDER — GABAPENTIN 250 MG/5ML PO SOLN
300.0000 mg | Freq: Three times a day (TID) | ORAL | Status: DC
  Administered 2024-05-12 – 2024-05-13 (×4): 300 mg via ORAL
  Filled 2024-05-12 (×5): qty 6

## 2024-05-12 MED ORDER — DOCUSATE SODIUM 50 MG/5ML PO LIQD
100.0000 mg | Freq: Two times a day (BID) | ORAL | Status: DC
  Administered 2024-05-12 – 2024-05-13 (×2): 100 mg via ORAL
  Filled 2024-05-12 (×2): qty 10

## 2024-05-12 MED ORDER — OXYCODONE HCL 5 MG PO TABS
5.0000 mg | ORAL_TABLET | ORAL | Status: DC | PRN
  Administered 2024-05-12 – 2024-05-13 (×3): 5 mg via ORAL
  Filled 2024-05-12 (×3): qty 1

## 2024-05-12 MED ORDER — ENSURE PLUS HIGH PROTEIN PO LIQD
237.0000 mL | Freq: Two times a day (BID) | ORAL | Status: DC
  Administered 2024-05-12 – 2024-05-13 (×3): 237 mL via ORAL

## 2024-05-12 MED ORDER — KETOROLAC TROMETHAMINE 15 MG/ML IJ SOLN
15.0000 mg | Freq: Four times a day (QID) | INTRAMUSCULAR | Status: DC | PRN
  Administered 2024-05-12 – 2024-05-13 (×3): 15 mg via INTRAVENOUS
  Filled 2024-05-12 (×3): qty 1
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
## 2024-05-13 ENCOUNTER — Other Ambulatory Visit (HOSPITAL_COMMUNITY): Payer: Self-pay

## 2024-05-13 MED ORDER — OXYCODONE HCL 5 MG PO TABS
5.0000 mg | ORAL_TABLET | Freq: Four times a day (QID) | ORAL | 0 refills | Status: DC | PRN
  Filled 2024-05-13: qty 30, 4d supply, fill #0

## 2024-05-13 MED ORDER — ACETAMINOPHEN 500 MG PO TABS: 1000.0000 mg | ORAL_TABLET | Freq: Four times a day (QID) | ORAL | Status: DC | PRN

## 2024-05-13 MED ORDER — MAGNESIUM CITRATE PO SOLN
1.0000 | Freq: Once | ORAL | Status: AC
  Administered 2024-05-13: 1 via ORAL
  Filled 2024-05-13: qty 296

## 2024-05-13 MED ORDER — GABAPENTIN 300 MG PO CAPS
300.0000 mg | ORAL_CAPSULE | Freq: Three times a day (TID) | ORAL | 0 refills | Status: DC
  Filled 2024-05-13: qty 90, 30d supply, fill #0

## 2024-05-13 NOTE — TOC Transition Note (Cosign Needed Addendum)
 Transition of Care [facility]) - Discharge Note   Patient Details  Name: Travis Lutz MRN: [id number] Date of Birth: August 26, 1956 Transition of Care [facility]) CM/SW Contact:  [name], RN Phone Number: 05/13/2024, 12:25 PM   Clinical Narrative:    Patient for likely discharge later today, per MD notes.  Met with patient and he states he wants to go home; he also states he has worked with PT twice this am.  PT recommending cane; tub bench recommended but patient states they have something for the shower. He does request a BSC to help make his toilet higher.  He plans to dc home with his fiance, who can provide needed assistance at dc.  She can provide transportation to appointments as needed.  Patient states he does have a PCP, but cannot remember his name.   Referral to [employer] for cane and BSC, to be delivered to bedside prior to dc.  Patient appreciative of assistance.     Patient is confined to one room and is unable to ambulate to the bathroom, therefore needing a commode at the bedside.      Final next level of care: OP Rehab Barriers to Discharge: Barriers Resolved   Patient Goals and CMS Choice Patient states their goals for this hospitalization and ongoing recovery are:: to go home CMS Medicare.gov Compare Post Acute Care list provided to:: Patient Choice offered to / list presented to : Patient                          Discharge Plan and Services Additional resources added to the After Visit Summary for     Discharge Planning Services: CM Consult Post Acute Care Choice: Durable Medical Equipment          DME Arranged: [name], Bedside commode   Date DME Agency Contacted: 05/13/24 Time DME Agency Contacted: 1224 Representative spoke with at DME Agency: [name]            Social Drivers of Health (SDOH) Interventions SDOH Screenings   Food Insecurity: Patient Declined (05/11/2024)  Housing: Unknown (05/11/2024)  Transportation Needs: Patient Declined  (05/11/2024)  Utilities: Patient Declined (05/11/2024)  Social Connections: Patient Declined (05/11/2024)  Tobacco Use: High Risk (05/10/2024)     Readmission Risk Interventions    05/12/2024    1:37 PM  Readmission Risk Prevention Plan  Post Dischage Appt Complete  Medication Screening Complete  Transportation Screening Complete   [name], RN, BSN  Trauma/Neuro ICU Case Manager [phone]
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
## 2024-05-13 NOTE — Plan of Care (Signed)
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
## 2024-05-13 NOTE — Progress Notes (Signed)
 SATURATION QUALIFICATIONS: (This note is used to comply with regulatory documentation for home oxygen)  Patient Saturations on Room Air at Rest = 92%  Patient Saturations on Room Air while Ambulating = 89%  Patient Saturations on -- Liters of oxygen while Ambulating = --%  Please briefly explain why patient needs home oxygen: maintained SPO2 89% and greater on RA   Tarig Zimmers S, PT DPT Acute Rehabilitation Services Secure Chat Preferred  Office [phone]]
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
## 2024-05-14 ENCOUNTER — Encounter: Payer: Self-pay | Admitting: Medical

## 2024-05-19 ENCOUNTER — Ambulatory Visit

## 2024-05-19 ENCOUNTER — Inpatient Hospital Stay: Admitting: Medical

## 2024-05-19 ENCOUNTER — Emergency Department (HOSPITAL_COMMUNITY)
Admission: EM | Admit: 2024-05-19 | Discharge: 2024-05-19 | Disposition: A | Discharge status: Home / Self Care | Attending: Emergency Medicine | Admitting: Emergency Medicine

## 2024-05-19 ENCOUNTER — Encounter (HOSPITAL_COMMUNITY): Payer: Self-pay

## 2024-05-19 ENCOUNTER — Other Ambulatory Visit: Payer: Self-pay

## 2024-05-19 ENCOUNTER — Emergency Department (HOSPITAL_COMMUNITY)

## 2024-05-19 DIAGNOSIS — F17210 Nicotine dependence, cigarettes, uncomplicated: Secondary | ICD-10-CM | POA: Insufficient documentation

## 2024-05-19 DIAGNOSIS — S92322A Displaced fracture of second metatarsal bone, left foot, initial encounter for closed fracture: Secondary | ICD-10-CM | POA: Insufficient documentation

## 2024-05-19 DIAGNOSIS — Z79899 Other long term (current) drug therapy: Secondary | ICD-10-CM | POA: Insufficient documentation

## 2024-05-19 DIAGNOSIS — S92352A Displaced fracture of fifth metatarsal bone, left foot, initial encounter for closed fracture: Secondary | ICD-10-CM | POA: Diagnosis not present

## 2024-05-19 DIAGNOSIS — W19XXXA Unspecified fall, initial encounter: Secondary | ICD-10-CM

## 2024-05-19 DIAGNOSIS — W010XXA Fall on same level from slipping, tripping and stumbling without subsequent striking against object, initial encounter: Secondary | ICD-10-CM | POA: Insufficient documentation

## 2024-05-19 DIAGNOSIS — I10 Essential (primary) hypertension: Secondary | ICD-10-CM | POA: Diagnosis not present

## 2024-05-19 DIAGNOSIS — R0789 Other chest pain: Secondary | ICD-10-CM | POA: Diagnosis not present

## 2024-05-19 DIAGNOSIS — M25519 Pain in unspecified shoulder: Secondary | ICD-10-CM | POA: Diagnosis not present

## 2024-05-19 MED ORDER — DICLOFENAC SODIUM 1 % EX GEL: 2.0000 g | Freq: Four times a day (QID) | CUTANEOUS | 0 refills | Status: AC | PRN

## 2024-05-19 MED ORDER — KETOROLAC TROMETHAMINE 15 MG/ML IJ SOLN
15.0000 mg | Freq: Once | INTRAMUSCULAR | Status: AC
  Administered 2024-05-19: 15 mg via INTRAVENOUS
  Filled 2024-05-19: qty 1

## 2024-05-19 MED ORDER — SODIUM CHLORIDE 0.9 % IV BOLUS
500.0000 mL | Freq: Once | INTRAVENOUS | Status: AC
  Administered 2024-05-19: 500 mL via INTRAVENOUS

## 2024-05-19 MED ORDER — MORPHINE SULFATE (PF) 4 MG/ML IV SOLN
4.0000 mg | Freq: Once | INTRAVENOUS | Status: AC
  Administered 2024-05-19: 4 mg via INTRAVENOUS
  Filled 2024-05-19: qty 1

## 2024-05-19 MED ORDER — ONDANSETRON HCL 4 MG/2ML IJ SOLN
4.0000 mg | Freq: Once | INTRAMUSCULAR | Status: AC
  Administered 2024-05-19: 4 mg via INTRAVENOUS
  Filled 2024-05-19: qty 2

## 2024-05-19 MED ORDER — LIDOCAINE 5 % EX PTCH: 1.0000 | MEDICATED_PATCH | Freq: Every day | CUTANEOUS | 0 refills | Status: DC | PRN

## 2024-05-19 NOTE — Progress Notes (Signed)
 Orthopedic Tech Progress Note Patient Details:  Travis Lutz Aug 26, 1956 [id number]  Ortho Devices Type of Ortho Device: Arm sling, Postop shoe/boot Ortho Device/Splint Location: LLE,LUE Ortho Device/Splint Interventions: Ordered, Application, Adjustment   Post Interventions Patient Tolerated: Well Instructions Provided: Care of device  [name] Pac 05/19/2024, 12:42 PM
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
## 2024-05-19 NOTE — Discharge Instructions (Addendum)
 It was a pleasure caring for you today in the emergency department.   Be sure to follow up with orthopedics regarding your collar bone fracture and foot bone fracture   Please return to the emergency department for any worsening or worrisome symptoms.

## 2024-05-19 NOTE — ED Notes (Signed)
 Pt transported to xray
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
## 2024-05-23 ENCOUNTER — Ambulatory Visit (INDEPENDENT_AMBULATORY_CARE_PROVIDER_SITE_OTHER): Admitting: Physician Assistant

## 2024-05-23 ENCOUNTER — Encounter (HOSPITAL_COMMUNITY): Payer: Self-pay | Admitting: Orthopaedic Surgery

## 2024-05-23 ENCOUNTER — Other Ambulatory Visit: Payer: Self-pay

## 2024-05-23 ENCOUNTER — Other Ambulatory Visit (INDEPENDENT_AMBULATORY_CARE_PROVIDER_SITE_OTHER): Payer: Self-pay

## 2024-05-23 DIAGNOSIS — M898X1 Other specified disorders of bone, shoulder: Secondary | ICD-10-CM

## 2024-05-23 MED ORDER — TRAMADOL HCL 50 MG PO TABS: 50.0000 mg | ORAL_TABLET | Freq: Three times a day (TID) | ORAL | 1 refills | Status: DC | PRN
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
## 2024-05-23 NOTE — Progress Notes (Signed)
 SDW call  Patient was given pre-op instructions over the phone. Patient verbalized understanding of instructions provided.     PCP - Alm gent, PA-C Cardiologist -  Pulmonary:    PPM/ICD - denies Device Orders - na Rep Notified - na   Chest x-ray - 05/12/2024 EKG -  05/19/2024 Stress Test - ECHO -  Cardiac Cath -   Sleep Study/sleep apnea/CPAP: denies  Non-diabetic  Blood Thinner Instructions: denies Aspirin Instructions:denies   ERAS Protcol - Clears until 0915   Anesthesia review: Yes. Level one trauma 8/30, rib fractures, pulmonary contusion   Patient denies shortness of breath, fever, cough and chest pain over the phone call  Your procedure is scheduled on Monday May 27, 2024  Report to [facility] Main Entrance A at  0945  A.M., then check in with the Admitting office.  Call this number if you have problems the morning of surgery:  [phone]   If you have any questions prior to your surgery date call [phone]: Open Monday-Friday 8am-4pm If you experience any cold or flu symptoms such as cough, fever, chills, shortness of breath, etc. between now and your scheduled surgery, please notify us  at the above number    Remember:  Do not eat after midnight the night before your surgery  You may drink clear liquids until  0915   the morning of your surgery.   Clear liquids allowed are: Water , Non-Citrus Juices (without pulp), Carbonated Beverages, Clear Tea, Black Coffee ONLY (NO MILK, CREAM OR POWDERED CREAMER of any kind), and Gatorade   Take these medicines the morning of surgery with A SIP OF WATER :  Amlodipine , atorvastin, neurontin   As needed: Tylenol , oxycodone , tramadol   As of today, STOP taking any Aspirin (unless otherwise instructed by your surgeon) Aleve , Naproxen , Ibuprofen , Motrin , Advil , Goody's, BC's, all herbal medications, fish oil, and all vitamins.

## 2024-05-26 ENCOUNTER — Encounter (HOSPITAL_COMMUNITY): Payer: Self-pay | Admitting: Orthopaedic Surgery

## 2024-05-26 ENCOUNTER — Ambulatory Visit (HOSPITAL_COMMUNITY)

## 2024-05-26 ENCOUNTER — Inpatient Hospital Stay: Admitting: Medical

## 2024-05-26 ENCOUNTER — Other Ambulatory Visit: Payer: Self-pay

## 2024-05-26 ENCOUNTER — Ambulatory Visit (HOSPITAL_COMMUNITY)
Admission: RE | Admit: 2024-05-26 | Discharge: 2024-05-26 | Disposition: A | Discharge status: Home / Self Care | Attending: Orthopaedic Surgery | Admitting: Orthopaedic Surgery

## 2024-05-26 ENCOUNTER — Encounter (HOSPITAL_COMMUNITY)
Admission: RE | Disposition: A | Discharge status: Home / Self Care | Payer: Self-pay | Source: Home / Self Care | Attending: Orthopaedic Surgery

## 2024-05-26 ENCOUNTER — Other Ambulatory Visit: Payer: Self-pay | Admitting: Physician Assistant

## 2024-05-26 ENCOUNTER — Ambulatory Visit (HOSPITAL_COMMUNITY): Payer: Self-pay

## 2024-05-26 ENCOUNTER — Ambulatory Visit (HOSPITAL_BASED_OUTPATIENT_CLINIC_OR_DEPARTMENT_OTHER): Payer: Self-pay

## 2024-05-26 DIAGNOSIS — X58XXXA Exposure to other specified factors, initial encounter: Secondary | ICD-10-CM | POA: Insufficient documentation

## 2024-05-26 DIAGNOSIS — S42002A Fracture of unspecified part of left clavicle, initial encounter for closed fracture: Secondary | ICD-10-CM | POA: Insufficient documentation

## 2024-05-26 DIAGNOSIS — I10 Essential (primary) hypertension: Secondary | ICD-10-CM | POA: Insufficient documentation

## 2024-05-26 DIAGNOSIS — F17210 Nicotine dependence, cigarettes, uncomplicated: Secondary | ICD-10-CM | POA: Diagnosis not present

## 2024-05-26 DIAGNOSIS — S42022A Displaced fracture of shaft of left clavicle, initial encounter for closed fracture: Secondary | ICD-10-CM | POA: Diagnosis not present

## 2024-05-26 DIAGNOSIS — S42002D Fracture of unspecified part of left clavicle, subsequent encounter for fracture with routine healing: Secondary | ICD-10-CM

## 2024-05-26 DIAGNOSIS — S4291XA Fracture of right shoulder girdle, part unspecified, initial encounter for closed fracture: Secondary | ICD-10-CM | POA: Diagnosis present

## 2024-05-26 HISTORY — DX: Dyspnea, unspecified: R06.00

## 2024-05-26 HISTORY — PX: ORIF CLAVICULAR FRACTURE: SHX5055

## 2024-05-26 LAB — COMPREHENSIVE METABOLIC PANEL WITH GFR
ALT: 24 U/L (ref 0–44)
AST: 30 U/L (ref 15–41)
Albumin: 3 g/dL — ABNORMAL LOW (ref 3.5–5.0)
Alkaline Phosphatase: 124 U/L (ref 38–126)
Anion gap: 12 (ref 5–15)
BUN: 15 mg/dL (ref 8–23)
CO2: 23 mmol/L (ref 22–32)
Calcium: 8.9 mg/dL (ref 8.9–10.3)
Chloride: 102 mmol/L (ref 98–111)
Creatinine, Ser: 1.02 mg/dL (ref 0.61–1.24)
GFR, Estimated: >60 mL/min (ref >60)
Glucose, Bld: 94 mg/dL (ref 70–99)
Potassium: 5.1 mmol/L (ref 3.5–5.1)
Sodium: 137 mmol/L (ref 135–145)
Total Bilirubin: 0.9 mg/dL (ref 0.0–1.2)
Total Protein: 7.5 g/dL (ref 6.5–8.1)

## 2024-05-26 SURGERY — OPEN REDUCTION INTERNAL FIXATION (ORIF) CLAVICULAR FRACTURE
Anesthesia: General | Site: Shoulder | Laterality: Left

## 2024-05-26 MED ORDER — MIDAZOLAM HCL 2 MG/2ML IJ SOLN
INTRAMUSCULAR | Status: AC
  Filled 2024-05-26: qty 2

## 2024-05-26 MED ORDER — DEXMEDETOMIDINE HCL IN NACL 80 MCG/20ML IV SOLN
INTRAVENOUS | Status: AC
  Filled 2024-05-26: qty 20

## 2024-05-26 MED ORDER — DEXAMETHASONE SODIUM PHOSPHATE 10 MG/ML IJ SOLN
INTRAMUSCULAR | Status: AC
  Filled 2024-05-26: qty 1

## 2024-05-26 MED ORDER — BUPIVACAINE-EPINEPHRINE (PF) 0.25% -1:200000 IJ SOLN
INTRAMUSCULAR | Status: AC
  Filled 2024-05-26: qty 30

## 2024-05-26 MED ORDER — DEXMEDETOMIDINE HCL IN NACL 80 MCG/20ML IV SOLN
INTRAVENOUS | Status: DC | PRN
Start: 2024-05-26 — End: 2024-05-26
  Administered 2024-05-26: 4 ug via INTRAVENOUS
  Administered 2024-05-26: 8 ug via INTRAVENOUS

## 2024-05-26 MED ORDER — PROPOFOL 10 MG/ML IV BOLUS
INTRAVENOUS | Status: AC
  Filled 2024-05-26: qty 20

## 2024-05-26 MED ORDER — ONDANSETRON HCL 4 MG/2ML IJ SOLN: 4.0000 mg | Freq: Once | INTRAMUSCULAR | Status: DC | PRN

## 2024-05-26 MED ORDER — FENTANYL CITRATE (PF) 100 MCG/2ML IJ SOLN: 25.0000 ug | INTRAMUSCULAR | Status: DC | PRN

## 2024-05-26 MED ORDER — PHENYLEPHRINE 80 MCG/ML (10ML) SYRINGE FOR IV PUSH (FOR BLOOD PRESSURE SUPPORT)
PREFILLED_SYRINGE | INTRAVENOUS | Status: AC
  Filled 2024-05-26: qty 10

## 2024-05-26 MED ORDER — OXYCODONE HCL 5 MG/5ML PO SOLN: 5.0000 mg | Freq: Once | ORAL | Status: DC | PRN

## 2024-05-26 MED ORDER — EPHEDRINE 5 MG/ML INJ
INTRAVENOUS | Status: AC
  Filled 2024-05-26: qty 5

## 2024-05-26 MED ORDER — CEFAZOLIN SODIUM-DEXTROSE 2-4 GM/100ML-% IV SOLN
2.0000 g | INTRAVENOUS | Status: AC
  Administered 2024-05-26: 2 g via INTRAVENOUS
  Filled 2024-05-26: qty 100

## 2024-05-26 MED ORDER — HYDROCODONE-ACETAMINOPHEN 5-325 MG PO TABS: 1.0000 | ORAL_TABLET | Freq: Three times a day (TID) | ORAL | 0 refills | Status: DC | PRN

## 2024-05-26 MED ORDER — ROCURONIUM BROMIDE 10 MG/ML (PF) SYRINGE
PREFILLED_SYRINGE | INTRAVENOUS | Status: AC
  Filled 2024-05-26: qty 10

## 2024-05-26 MED ORDER — ORAL CARE MOUTH RINSE: 15.0000 mL | Freq: Once | OROMUCOSAL | Status: AC

## 2024-05-26 MED ORDER — METHOCARBAMOL 500 MG PO TABS
500.0000 mg | ORAL_TABLET | Freq: Once | ORAL | Status: DC
  Filled 2024-05-26: qty 1

## 2024-05-26 MED ORDER — PHENYLEPHRINE HCL-NACL 20-0.9 MG/250ML-% IV SOLN
INTRAVENOUS | Status: DC | PRN
  Administered 2024-05-26: 80 ug/min via INTRAVENOUS

## 2024-05-26 MED ORDER — LACTATED RINGERS IV SOLN: INTRAVENOUS | Status: DC

## 2024-05-26 MED ORDER — ROCURONIUM BROMIDE 10 MG/ML (PF) SYRINGE
PREFILLED_SYRINGE | INTRAVENOUS | Status: DC | PRN
  Administered 2024-05-26 (×2): 10 mg via INTRAVENOUS
  Administered 2024-05-26: 50 mg via INTRAVENOUS

## 2024-05-26 MED ORDER — 0.9 % SODIUM CHLORIDE (POUR BTL) OPTIME
TOPICAL | Status: DC | PRN
  Administered 2024-05-26: 1000 mL

## 2024-05-26 MED ORDER — SUGAMMADEX SODIUM 200 MG/2ML IV SOLN
INTRAVENOUS | Status: DC | PRN
  Administered 2024-05-26: 100 mg via INTRAVENOUS
  Administered 2024-05-26: 50 mg via INTRAVENOUS

## 2024-05-26 MED ORDER — BUPIVACAINE-EPINEPHRINE 0.25% -1:200000 IJ SOLN: INTRAMUSCULAR | Status: DC | PRN

## 2024-05-26 MED ORDER — PROPOFOL 10 MG/ML IV BOLUS
INTRAVENOUS | Status: DC | PRN
  Administered 2024-05-26 (×4): 50 mg via INTRAVENOUS

## 2024-05-26 MED ORDER — FENTANYL CITRATE (PF) 250 MCG/5ML IJ SOLN
INTRAMUSCULAR | Status: DC | PRN
  Administered 2024-05-26 (×3): 50 ug via INTRAVENOUS
  Administered 2024-05-26: 100 ug via INTRAVENOUS

## 2024-05-26 MED ORDER — ONDANSETRON HCL 4 MG PO TABS: 4.0000 mg | ORAL_TABLET | Freq: Three times a day (TID) | ORAL | 0 refills | Status: DC | PRN

## 2024-05-26 MED ORDER — DROPERIDOL 2.5 MG/ML IJ SOLN
0.6250 mg | Freq: Once | INTRAMUSCULAR | Status: AC | PRN
  Administered 2024-05-26: 0.625 mg via INTRAVENOUS

## 2024-05-26 MED ORDER — CHLORHEXIDINE GLUCONATE 0.12 % MT SOLN
15.0000 mL | Freq: Once | OROMUCOSAL | Status: AC
  Administered 2024-05-26: 15 mL via OROMUCOSAL
  Filled 2024-05-26: qty 15

## 2024-05-26 MED ORDER — VASOPRESSIN 20 UNIT/ML IV SOLN
INTRAVENOUS | Status: AC
  Filled 2024-05-26: qty 1

## 2024-05-26 MED ORDER — FENTANYL CITRATE (PF) 250 MCG/5ML IJ SOLN
INTRAMUSCULAR | Status: AC
  Filled 2024-05-26: qty 5

## 2024-05-26 MED ORDER — LIDOCAINE 2% (20 MG/ML) 5 ML SYRINGE
INTRAMUSCULAR | Status: AC
  Filled 2024-05-26: qty 5

## 2024-05-26 MED ORDER — MIDAZOLAM HCL 2 MG/2ML IJ SOLN
INTRAMUSCULAR | Status: DC | PRN
  Administered 2024-05-26: 2 mg via INTRAVENOUS

## 2024-05-26 MED ORDER — DOCUSATE SODIUM 100 MG PO CAPS: 100.0000 mg | ORAL_CAPSULE | Freq: Every day | ORAL | 2 refills | Status: AC | PRN

## 2024-05-26 MED ORDER — ALBUMIN HUMAN 5 % IV SOLN: INTRAVENOUS | Status: DC | PRN

## 2024-05-26 MED ORDER — HYDROMORPHONE HCL 1 MG/ML IJ SOLN
INTRAMUSCULAR | Status: DC | PRN
  Administered 2024-05-26: .5 mg via INTRAVENOUS

## 2024-05-26 MED ORDER — HYDROMORPHONE HCL 1 MG/ML IJ SOLN
INTRAMUSCULAR | Status: AC
  Filled 2024-05-26: qty 0.5

## 2024-05-26 MED ORDER — PHENYLEPHRINE 80 MCG/ML (10ML) SYRINGE FOR IV PUSH (FOR BLOOD PRESSURE SUPPORT)
PREFILLED_SYRINGE | INTRAVENOUS | Status: DC | PRN
  Administered 2024-05-26 (×2): 160 ug via INTRAVENOUS

## 2024-05-26 MED ORDER — OXYCODONE HCL 5 MG PO TABS: 5.0000 mg | ORAL_TABLET | Freq: Once | ORAL | Status: DC | PRN

## 2024-05-26 MED ORDER — DEXAMETHASONE SODIUM PHOSPHATE 10 MG/ML IJ SOLN
INTRAMUSCULAR | Status: DC | PRN
  Administered 2024-05-26: 10 mg via INTRAVENOUS

## 2024-05-26 MED ORDER — LIDOCAINE 2% (20 MG/ML) 5 ML SYRINGE
INTRAMUSCULAR | Status: DC | PRN
  Administered 2024-05-26: 100 mg via INTRAVENOUS

## 2024-05-26 MED ORDER — ONDANSETRON HCL 4 MG/2ML IJ SOLN
INTRAMUSCULAR | Status: AC
  Filled 2024-05-26: qty 2

## 2024-05-26 MED ORDER — ONDANSETRON HCL 4 MG/2ML IJ SOLN
INTRAMUSCULAR | Status: DC | PRN
Start: 2024-05-26 — End: 2024-05-26
  Administered 2024-05-26: 4 mg via INTRAVENOUS

## 2024-05-26 MED ORDER — DROPERIDOL 2.5 MG/ML IJ SOLN
INTRAMUSCULAR | Status: AC
  Filled 2024-05-26: qty 2

## 2024-05-26 SURGICAL SUPPLY — 41 items
BAG COUNTER SPONGE SURGICOUNT (BAG) ×1 IMPLANT
BIT DRILL CLAV ALPS 2.7X145 (BIT) IMPLANT
COVER SURGICAL LIGHT HANDLE (MISCELLANEOUS) ×1 IMPLANT
DRAPE C-ARM 42X72 X-RAY (DRAPES) ×1 IMPLANT
DRAPE U-SHAPE 47X51 STRL (DRAPES) ×1 IMPLANT
DRAPE UNIVERSAL PACK (DRAPES) ×1 IMPLANT
DRIVER RETENTION T15 LONG (ORTHOPEDIC DISPOSABLE SUPPLIES) IMPLANT
DRSG TEGADERM 4X4.75 (GAUZE/BANDAGES/DRESSINGS) ×2 IMPLANT
DRSG XEROFORM 1X8 (GAUZE/BANDAGES/DRESSINGS) IMPLANT
DURAPREP 26ML APPLICATOR (WOUND CARE) ×2 IMPLANT
ELECT CAUTERY BLADE 6.4 (BLADE) ×1 IMPLANT
ELECTRODE REM PT RTRN 9FT ADLT (ELECTROSURGICAL) ×1 IMPLANT
GAUZE SPONGE 4X4 12PLY STRL (GAUZE/BANDAGES/DRESSINGS) IMPLANT
GAUZE XEROFORM 1X8 LF (GAUZE/BANDAGES/DRESSINGS) ×1 IMPLANT
GLOVE BIOGEL PI IND STRL 7.0 (GLOVE) ×1 IMPLANT
GLOVE BIOGEL PI IND STRL 7.5 (GLOVE) ×1 IMPLANT
GLOVE ECLIPSE 7.0 STRL STRAW (GLOVE) ×5 IMPLANT
GLOVE SURG SYN 7.5 PF PI (GLOVE) ×5 IMPLANT
GOWN STRL SURGICAL XL XLNG (GOWN DISPOSABLE) ×2 IMPLANT
KIT BASIN OR (CUSTOM PROCEDURE TRAY) ×1 IMPLANT
KIT TURNOVER KIT B (KITS) ×1 IMPLANT
KWIRE TROCHAR TIP ALPS 1.6 (WIRE) IMPLANT
MANIFOLD NEPTUNE II (INSTRUMENTS) ×1 IMPLANT
NDL HYPO 25GX1X1/2 BEV (NEEDLE) IMPLANT
NEEDLE HYPO 25GX1X1/2 BEV (NEEDLE) IMPLANT
NS IRRIG 1000ML POUR BTL (IV SOLUTION) ×1 IMPLANT
PACK SHOULDER (CUSTOM PROCEDURE TRAY) ×1 IMPLANT
PAD ARMBOARD POSITIONER FOAM (MISCELLANEOUS) ×2 IMPLANT
PLATE CLAV SUP 125 12H LT (Plate) IMPLANT
SCREW CORT LP 3.5X14 (Screw) IMPLANT
SCREW LOCK CORT STAR 3.5X14 (Screw) IMPLANT
SCREW LOCK CORT STAR 3.5X16 (Screw) IMPLANT
SCREW TIS LP 3.5X18 NS (Screw) IMPLANT
SLING ARM FOAM STRAP LRG (SOFTGOODS) IMPLANT
STRIP CLOSURE SKIN 1/2X4 (GAUZE/BANDAGES/DRESSINGS) ×1 IMPLANT
SUCTION TUBE FRAZIER 10FR DISP (SUCTIONS) ×1 IMPLANT
SUT BROADBAND TAPE 2PK 1.5 (SUTURE) IMPLANT
SUT MNCRL AB 4-0 PS2 18 (SUTURE) ×1 IMPLANT
SUT VIC AB 2-0 CT1 TAPERPNT 27 (SUTURE) ×1 IMPLANT
SYR CONTROL 10ML LL (SYRINGE) IMPLANT
UNDERPAD 30X36 HEAVY ABSORB (UNDERPADS AND DIAPERS) ×1 IMPLANT

## 2024-05-26 NOTE — Anesthesia Procedure Notes (Signed)
 Procedure Name: Intubation Date/Time: 05/26/2024 12:50 PM  Performed by: Delores Dus, CRNAPre-anesthesia Checklist: Patient identified, Emergency Drugs available, Suction available and Patient being monitored Patient Re-evaluated:Patient Re-evaluated prior to induction Oxygen Delivery Method: Circle system utilized Preoxygenation: Pre-oxygenation with 100% oxygen Induction Type: IV induction Ventilation: Mask ventilation without difficulty Laryngoscope Size: Miller and 2 Grade View: Grade I Tube type: Oral Tube size: 7.0 mm Number of attempts: 1 Airway Equipment and Method: Stylet and Oral airway Placement Confirmation: ETT inserted through vocal cords under direct vision, positive ETCO2 and breath sounds checked- equal and bilateral Secured at: 21 cm Tube secured with: Tape Dental Injury: Teeth and Oropharynx as per pre-operative assessment

## 2024-05-26 NOTE — H&P (Signed)
 PREOPERATIVE H&P  Chief Complaint: left clavicle fracture  HPI: Travis Lutz is a 68 y.o. male who presents for surgical treatment of left clavicle fracture.  He denies any changes in medical history.  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  03/2024.   Social Drivers of Corporate investment banker Strain: Not on file  Food Insecurity: Patient Declined (05/11/2024)   Hunger Vital Sign    Worried About Running Out of Food in the Last Year: Patient declined    Ran Out of Food in the Last Year: Patient declined  Transportation Needs: Patient Declined (05/11/2024)   PRAPARE - Administrator, Civil Service (Medical): Patient declined    Lack of Transportation (Non-Medical): Patient declined  Physical Activity: Not on file  Stress: Not on file  Social Connections: Patient Declined (05/11/2024)   Social Connection and Isolation Panel    Frequency of Communication with Friends and Family: Patient declined    Frequency of Social Gatherings with Friends and Family:  Patient declined    Attends Religious Services: Patient declined    Database administrator or Organizations: Patient declined    Attends Engineer, structural: Patient declined    Marital Status: Patient declined   Family History  Problem Relation Age of Onset   Diabetes Mother    No Known Allergies Prior to Admission medications   Medication Sig Start Date End Date Taking? Authorizing Provider  acetaminophen  (TYLENOL ) 500 MG tablet Take 2 tablets (1,000 mg total) by mouth every 6 (six) hours as needed for mild pain (pain score 1-3) or headache. 05/13/24   Vicci Burnard SAUNDERS, PA-C  amLODipine  (NORVASC ) 5 MG tablet Take 1 tablet (5 mg total) by mouth daily. 04/02/24   Tysinger, Alm RAMAN, PA-C  amLODipine  (NORVASC ) 5 MG tablet Take 5 mg by mouth daily. 01/08/24   [provider]  atorvastatin  (LIPITOR) 10 MG tablet Take 1 tablet (10 mg total) by mouth daily. 04/02/24   Tysinger, Alm RAMAN, PA-C  atorvastatin  (LIPITOR) 10 MG tablet Take 10 mg by mouth daily. 01/02/24   [provider]  cyanocobalamin  (VITAMIN B12) 1000 MCG tablet Take 1 tablet (1,000 mcg total) by mouth daily. 04/02/24   Tysinger, Alm RAMAN, PA-C  diclofenac  (VOLTAREN ) 75 MG EC tablet Take 1 tablet (75 mg total) by mouth  2 (two) times daily. 01/08/24   Jerri Kay HERO, MD  diclofenac  Sodium (VOLTAREN ) 1 % GEL Apply 2 g topically 4 (four) times daily as needed for up to 7 days. 05/19/24 05/26/24  Elnor Jayson LABOR, DO  docusate sodium  (COLACE) 100 MG capsule Take 1 capsule (100 mg total) by mouth daily as needed. 05/26/24 05/26/25  Jule Ronal CROME, PA-C  gabapentin  (NEURONTIN ) 300 MG capsule Take 1 capsule (300 mg total) by mouth 3 (three) times daily. 05/13/24 06/12/24  Vicci Burnard SAUNDERS, PA-C  HYDROcodone -acetaminophen  (NORCO/VICODIN) 5-325 MG tablet Take 1 tablet by mouth 3 (three) times daily as needed. 05/26/24   Jule Ronal CROME, PA-C  ibuprofen  (ADVIL ) 200 MG tablet Take 200 mg by mouth every 6 (six) hours as needed for mild  pain (pain score 1-3).    [provider]  ibuprofen  (ADVIL ) 600 MG tablet Take 1 tablet (600 mg total) by mouth every 8 (eight) hours as needed. 04/06/22   Schuyler Charlie RAMAN, MD  lidocaine  (LIDODERM ) 5 % Place 1 patch onto the skin daily as needed. Remove & Discard patch within 12 hours or as directed by MD 05/19/24   Elnor Jayson LABOR, DO  ondansetron  (ZOFRAN ) 4 MG tablet Take 1 tablet (4 mg total) by mouth every 8 (eight) hours as needed for nausea or vomiting. 05/26/24   Jule Ronal CROME, PA-C  oxyCODONE  (OXY IR/ROXICODONE ) 5 MG immediate release tablet Take 1-2 tablets (5-10 mg total) by mouth every 6 (six) hours as needed for severe pain (pain score 7-10) or moderate pain (pain score 4-6). 05/13/24   Vicci Burnard SAUNDERS, PA-C  polyethylene glycol powder (GLYCOLAX /MIRALAX ) 17 GM/SCOOP powder Take 17 g by mouth 2 (two) times daily as needed. 04/02/24   Tysinger, Alm RAMAN, PA-C  traMADol  (ULTRAM ) 50 MG tablet Take 1-2 tablets (50-100 mg total) by mouth daily as needed. 03/04/24   Jerri Kay HERO, MD  traMADol  (ULTRAM ) 50 MG tablet Take 1-2 tablets (50-100 mg total) by mouth 3 (three) times daily with meals as needed. 05/23/24   Jule Ronal CROME, PA-C  Vitamin D , Ergocalciferol , (DRISDOL ) 1.25 MG (50000 UNIT) CAPS capsule Take 1 capsule (50,000 Units total) by mouth every 7 (seven) days. 04/02/24   Tysinger, Alm RAMAN, PA-C  Vitamin D , Ergocalciferol , (DRISDOL ) 1.25 MG (50000 UNIT) CAPS capsule Take 50,000 Units by mouth once a week. 01/03/24   [provider]     Positive ROS: All other systems have been reviewed and were otherwise negative with the exception of those mentioned in the HPI and as above.  Physical Exam: General: Alert, no acute distress Cardiovascular: No pedal edema Respiratory: No cyanosis, no use of accessory musculature GI: abdomen soft Skin: No lesions in the area of chief complaint Neurologic: Sensation intact distally Psychiatric: Patient is competent for consent with  normal mood and affect Lymphatic: no lymphedema  MUSCULOSKELETAL: exam stable  Assessment: left clavicle fracture  Plan: Plan for Procedure(s): OPEN REDUCTION INTERNAL FIXATION (ORIF) CLAVICULAR FRACTURE  The risks benefits and alternatives were discussed with the patient including but not limited to the risks of nonoperative treatment, versus surgical intervention including infection, bleeding, nerve injury,  blood clots, cardiopulmonary complications, morbidity, mortality, among others, and they were willing to proceed.   Ozell Jerri, MD 05/26/2024 10:28 AM

## 2024-05-26 NOTE — Progress Notes (Signed)
 Dr. Waddell made aware patient's potassium level was 5.1 on today's CMP. No new orders received.

## 2024-05-26 NOTE — Transfer of Care (Signed)
 Immediate Anesthesia Transfer of Care Note  Patient: Travis Lutz  Procedure(s) Performed: OPEN REDUCTION INTERNAL FIXATION (ORIF) CLAVICULAR FRACTURE (Left: Shoulder)  Patient Location: PACU  Anesthesia Type:General  Level of Consciousness: sedated  Airway & Oxygen Therapy: Patient Spontanous Breathing and Patient connected to face mask oxygen  Post-op Assessment: Report given to RN and Post -op Vital signs reviewed and stable  Post vital signs: Reviewed and stable  Last Vitals:  Vitals Value Taken Time  BP 107/64 05/26/24 15:20  Temp    Pulse 79 05/26/24 15:24  Resp 17 05/26/24 15:24  SpO2 100 % 05/26/24 15:24  Vitals shown include unfiled device data.  Last Pain:  Vitals:   05/26/24 1029  TempSrc:   PainSc: 10-Worst pain ever      Patients Stated Pain Goal: 4 (05/26/24 1029)  Complications: No notable events documented.

## 2024-05-26 NOTE — Discharge Instructions (Signed)
 Postoperative instructions:  Weightbearing instructions: non weight bearing. Wear shoulder sling at all times.  Dressing instructions: Keep your dressing and/or splint clean and dry at all times.  It will be removed at your first post-operative appointment.  Your stitches and/or staples will be removed at this visit.  Incision instructions:  Do not soak your incision for 3 weeks after surgery.  If the incision gets wet, pat dry and do not scrub the incision.  Pain control:  You have been given a prescription to be taken as directed for post-operative pain control.  In addition, elevate the operative extremity above the heart at all times to prevent swelling and throbbing pain.  Take over-the-counter Colace, 100mg  by mouth twice a day while taking narcotic pain medications to help prevent constipation.  Follow up appointments: 1) 14 days for suture removal and wound check. 2) Dr. Jerri as scheduled.   -------------------------------------------------------------------------------------------------------------  After Surgery Pain Control:  After your surgery, post-surgical discomfort or pain is likely. This discomfort can last several days to a few weeks. At certain times of the day your discomfort may be more intense.  Did you receive a nerve block?  A nerve block can provide pain relief for one hour to two days after your surgery. As long as the nerve block is working, you will experience little or no sensation in the area the surgeon operated on.  As the nerve block wears off, you will begin to experience pain or discomfort. It is very important that you begin taking your prescribed pain medication before the nerve block fully wears off. Treating your pain at the first sign of the block wearing off will ensure your pain is better controlled and more tolerable when full-sensation returns. Do not wait until the pain is intolerable, as the medicine will be less effective. It is better to treat  pain in advance than to try and catch up.  General Anesthesia:  If you did not receive a nerve block during your surgery, you will need to start taking your pain medication shortly after your surgery and should continue to do so as prescribed by your surgeon.  Pain Medication:  Most commonly we prescribe Vicodin and Percocet for post-operative pain. Both of these medications contain a combination of acetaminophen  (Tylenol ) and a narcotic to help control pain.   It takes between 30 and 45 minutes before pain medication starts to work. It is important to take your medication before your pain level gets too intense.   Nausea is a common side effect of many pain medications. You will want to eat something before taking your pain medicine to help prevent nausea.   If you are taking a prescription pain medication that contains acetaminophen , we recommend that you do not take additional over the counter acetaminophen  (Tylenol ).  Other pain relieving options:   Using a cold pack to ice the affected area a few times a day (15 to 20 minutes at a time) can help to relieve pain, reduce swelling and bruising.   Elevation of the affected area can also help to reduce pain and swelling.  Per [facility] clinic policy, our goal is ensure optimal postoperative pain control with a multimodal pain management strategy. For all [facility] patients, our goal is to wean post-operative narcotic medications by 6 weeks post-operatively. If this is not possible due to utilization of pain medication prior to surgery, your [facility] doctor will support your acute post-operative pain control for the first 6 weeks postoperatively, with a plan  to transition you back to your primary pain team following that. Maralee will work to ensure a Therapist, occupational.

## 2024-05-26 NOTE — Op Note (Signed)
 Date of Surgery: 05/26/2024  INDICATIONS: [name] is a 68 y.o.-year-old male who sustained a left clavicle fracture with skin tenting and blanching;  The patient did consent to the procedure after discussion of the risks and benefits.  PREOPERATIVE DIAGNOSIS: Left clavicle fracture with skin tenting and blanching  POSTOPERATIVE DIAGNOSIS: Same.  PROCEDURE: Open treatment of left clavicle fracture with internal fixation  OPERATIVE FINDINGS: Significant comminution and shortening of the fracture Successful ORIF with bridge plating  SURGEON: N. Ozell Cummins, M.D.  ASSIST: Ronal Morna Grave, [place]  ANESTHESIA:  general  IV FLUIDS AND URINE: See anesthesia.  ESTIMATED BLOOD LOSS: 25 mL.  IMPLANTS: Biomet superior plate  COMPLICATIONS: None.  DESCRIPTION OF PROCEDURE: The patient was brought to the operating room and placed supine on the operating table.  The patient had been signed prior to the procedure and this was documented. The patient had the anesthesia placed by the anesthesiologist.  The patient was then placed in the beach chair position.  All bony prominences were well padded.  A time-out was performed to confirm that this was the correct patient, site, side and location. The patient had an SCD on the lower extremities. The patient did receive antibiotics prior to the incision and was re-dosed during the procedure as needed at indicated intervals.  The patient had the operative extremity prepped and draped in the standard surgical fashion.    An incision based over the clavicle was used.  Cutaneous nerves were identified and protected as much as possible.  Full thickness flaps were elevated off of the clavicle.  The fracture was exposed.  There were several butterfly fragments including a large inferior wound that spanned 70% of the length of the clavicle.  Anatomic reduction was not possible due to the fracture pattern and the age of the fracture.  There was about a  centimeter of shortening.  Any organized hematoma and entrapped periosteum were retrieved from the fracture sites.  I was not able to bring the fracture completely out to length due to the age of the fracture.  The 2 butterfly fragments that were tenting the skin were reduced.  With the plate in the appropriate position and the fracture pulled out the length I placed 3 nonlocking bicortical screws in the lateral clavicle through the plate.  I then worked medially to bring the medial portion of the plate to the medial clavicle.  I then used a max braid suture to cerclage the butterfly fragments to the main fracture.  I was happy with the bony apposition I was able to achieve.  Nonlocking screws were placed to secure the medial end of the plate to the clavicle.  Care was taken not to plunge with any of the instruments.  Retractors were used as added protection to the neurovascular and pulmonary structures.  Final fluoroscopy pictures were taken to confirm plate placement and fracture reduction.  The wound was thoroughly irrigated and closed in a layer fashion using 0 vicryl, 2.0 vicryl and 2-0 nylon and Steri-Strips.  Local was placed.  Sterile dressings were applied and the patient was extubated and transferred to the PACU in stable condition.  Morna Grave, my PA, was a medical necessity for the entirety of the surgery including opening, closing, limb positioning, retracting, exposing, and repairing.  POSTOPERATIVE PLAN: Patient will be in a sling for comfort.  He is allowed to range his shoulder up to the level of the shoulder and not allowed to lift anything.  Observation overnight for  pain control and discharge home in the morning.  GEANNIE Ozell Cummins, MD [facility] 2:50 PM

## 2024-05-26 NOTE — Anesthesia Postprocedure Evaluation (Signed)
 Anesthesia Post Note  Patient: Travis Lutz  Procedure(s) Performed: OPEN REDUCTION INTERNAL FIXATION (ORIF) CLAVICULAR FRACTURE (Left: Shoulder)     Patient location during evaluation: PACU Anesthesia Type: General Level of consciousness: awake and alert Pain management: pain level controlled Vital Signs Assessment: post-procedure vital signs reviewed and stable Respiratory status: spontaneous breathing, nonlabored ventilation and respiratory function stable Cardiovascular status: blood pressure returned to baseline and stable Postop Assessment: no apparent nausea or vomiting, able to ambulate and adequate PO intake Anesthetic complications: no   No notable events documented.  Last Vitals:  Vitals:   05/26/24 1600 05/26/24 1615  BP: (!) 141/82 (!) 145/83  Pulse: 77 82  Resp: 12 10  Temp:  36.8 C  SpO2: 96% 96%    Last Pain:  Vitals:   05/26/24 1615  TempSrc:   PainSc: 0-No pain                 Lauraine KATHEE [place]

## 2024-05-26 NOTE — Anesthesia Preprocedure Evaluation (Addendum)
 Anesthesia Evaluation  Patient identified by MRN, date of birth, ID band Patient awake    Reviewed: Allergy & Precautions, NPO status , Patient's Chart, lab work & pertinent test results, reviewed documented beta blocker date and time   History of Anesthesia Complications Negative for: history of anesthetic complications  Airway Mallampati: I  TM Distance: >3 FB Neck ROM: Full    Dental  (+) Dental Advisory Given, Edentulous Upper, Poor Dentition   Pulmonary Current Smoker and Patient abstained from smoking.   breath sounds clear to auscultation       Cardiovascular hypertension,  Rhythm:Regular Rate:Tachycardia     Neuro/Psych    GI/Hepatic ,neg GERD  ,,(+)     substance abuse  alcohol use, cocaine use and marijuana use  Endo/Other    Renal/GU      Musculoskeletal L clavicular fracture, rib fractures, L foot fracture.   Abdominal   Peds  Hematology   Anesthesia Other Findings   Reproductive/Obstetrics                              Anesthesia Physical Anesthesia Plan  ASA: 3  Anesthesia Plan: General   Post-op Pain Management: Ofirmev  IV (intra-op)*   Induction: Intravenous  PONV Risk Score and Plan: 1  Airway Management Planned: Oral ETT  Additional Equipment:   Intra-op Plan:   Post-operative Plan: Extubation in OR  Informed Consent:      Dental advisory given  Plan Discussed with: CRNA  Anesthesia Plan Comments: (Last cocaine use > 1 week ago. Marijuana use on 05/25/24. K 5.1 preoperatively without evidence of peaked T waves and hemolysis noted on CMP. )         Anesthesia Quick Evaluation

## 2024-05-27 ENCOUNTER — Encounter (HOSPITAL_COMMUNITY): Payer: Self-pay | Admitting: Orthopaedic Surgery

## 2024-05-29 ENCOUNTER — Telehealth: Payer: Self-pay

## 2024-05-29 ENCOUNTER — Ambulatory Visit: Admitting: Orthopaedic Surgery

## 2024-05-29 NOTE — Telephone Encounter (Signed)
 Sent pt. A MyChart message stating he needs to call his Ortho office that did his surgery with any question relating to his surgery.   Copied from CRM [id number]. Topic: Clinical - Medical Advice >> May 29, 2024 12:56 PM Gustabo D wrote: Pt had surgery on shoulder area and was told to call if the wrap lifted and got condensation in it. He finance says it has and she is calling to see what he needs to do. I spoke with the pt he gave me permission to speak with his fiance. [phone] [place]

## 2024-05-30 ENCOUNTER — Telehealth: Payer: Self-pay | Admitting: Radiology

## 2024-05-30 ENCOUNTER — Ambulatory Visit: Admitting: Medical

## 2024-05-30 NOTE — Telephone Encounter (Signed)
 Travis Lutz, patients fiance called. Stated patients bandage is starting to lift and have some condensation underneath. Do they need to leave bandage until post op or change to dry dressing?  05/26/24 ORIF Clavicle fx - Dr. Jerri  Call back [phone]

## 2024-05-30 NOTE — Telephone Encounter (Signed)
 Called his phone and daughter answered.  She told me he was en route to our office to have bandage changed.  We can apply 4x4 folded over and covered with tegaderm

## 2024-06-02 NOTE — Telephone Encounter (Signed)
 Changed dressing Fri. 05/30/24

## 2024-06-05 ENCOUNTER — Ambulatory Visit

## 2024-06-05 NOTE — Progress Notes (Signed)
 Patient came in the office today for a Nurse visit. He states the bandage was Pulling. I took off bandage and replaced with a dry dressing.

## 2024-06-10 ENCOUNTER — Encounter: Admitting: Physician Assistant

## 2024-06-11 ENCOUNTER — Ambulatory Visit: Admitting: Physician Assistant

## 2024-06-11 ENCOUNTER — Other Ambulatory Visit: Payer: Self-pay

## 2024-06-11 DIAGNOSIS — M898X1 Other specified disorders of bone, shoulder: Secondary | ICD-10-CM
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
## 2024-06-17 ENCOUNTER — Ambulatory Visit: Admitting: Medical

## 2024-06-17 ENCOUNTER — Encounter: Payer: Self-pay | Admitting: Medical

## 2024-06-17 VITALS — BP 120/74 | HR 92 | Temp 98.4°F | Ht 66.0 in | Wt 120.0 lb

## 2024-06-17 DIAGNOSIS — R3911 Hesitancy of micturition: Secondary | ICD-10-CM

## 2024-06-17 DIAGNOSIS — N401 Enlarged prostate with lower urinary tract symptoms: Secondary | ICD-10-CM

## 2024-06-17 DIAGNOSIS — R972 Elevated prostate specific antigen [PSA]: Secondary | ICD-10-CM | POA: Diagnosis not present

## 2024-06-17 DIAGNOSIS — N5082 Scrotal pain: Secondary | ICD-10-CM

## 2024-06-17 MED ORDER — TAMSULOSIN HCL 0.4 MG PO CAPS: 0.4000 mg | ORAL_CAPSULE | Freq: Every day | ORAL | 2 refills | Status: DC

## 2024-06-17 MED ORDER — SULFAMETHOXAZOLE-TRIMETHOPRIM 800-160 MG PO TABS: 1.0000 | ORAL_TABLET | Freq: Two times a day (BID) | ORAL | 0 refills | Status: DC

## 2024-06-17 MED ORDER — HYDROCODONE-ACETAMINOPHEN 5-325 MG PO TABS: 1.0000 | ORAL_TABLET | Freq: Four times a day (QID) | ORAL | 0 refills | Status: DC | PRN
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
## 2024-06-18 ENCOUNTER — Inpatient Hospital Stay: Admission: RE | Admit: 2024-06-18 | Source: Ambulatory Visit

## 2024-06-20 ENCOUNTER — Other Ambulatory Visit: Payer: Self-pay | Admitting: Medical

## 2024-06-20 ENCOUNTER — Ambulatory Visit
Admission: RE | Admit: 2024-06-20 | Discharge: 2024-06-20 | Disposition: A | Discharge status: Home / Self Care | Source: Ambulatory Visit | Attending: Medical | Admitting: Medical

## 2024-06-20 DIAGNOSIS — N5082 Scrotal pain: Secondary | ICD-10-CM

## 2024-06-20 DIAGNOSIS — I861 Scrotal varices: Secondary | ICD-10-CM | POA: Diagnosis not present

## 2024-06-20 DIAGNOSIS — R972 Elevated prostate specific antigen [PSA]: Secondary | ICD-10-CM

## 2024-06-20 DIAGNOSIS — R3911 Hesitancy of micturition: Secondary | ICD-10-CM

## 2024-06-20 DIAGNOSIS — N401 Enlarged prostate with lower urinary tract symptoms: Secondary | ICD-10-CM

## 2024-06-22 ENCOUNTER — Ambulatory Visit: Payer: Self-pay | Admitting: Medical

## 2024-06-22 NOTE — Progress Notes (Signed)
 Results thru my chart

## 2024-06-23 ENCOUNTER — Other Ambulatory Visit: Payer: Self-pay | Admitting: Medical

## 2024-06-23 MED ORDER — DOXYCYCLINE HYCLATE 100 MG PO TABS: 100.0000 mg | ORAL_TABLET | Freq: Two times a day (BID) | ORAL | 0 refills | Status: DC

## 2024-06-23 NOTE — Progress Notes (Signed)
 Since he has had very little improvement continue the Bactrim  but lets add a second antibiotic called doxycycline  for possible genitourinary infection.  If not much improved within the next 10 days we will need to refer to urology

## 2024-07-09 ENCOUNTER — Encounter: Admitting: Physician Assistant

## 2024-07-14 ENCOUNTER — Encounter: Payer: Self-pay | Admitting: Radiology

## 2024-07-25 ENCOUNTER — Other Ambulatory Visit (INDEPENDENT_AMBULATORY_CARE_PROVIDER_SITE_OTHER): Payer: Self-pay

## 2024-07-25 ENCOUNTER — Ambulatory Visit (INDEPENDENT_AMBULATORY_CARE_PROVIDER_SITE_OTHER): Admitting: Physician Assistant

## 2024-07-25 DIAGNOSIS — M898X1 Other specified disorders of bone, shoulder: Secondary | ICD-10-CM

## 2024-07-25 DIAGNOSIS — S42022A Displaced fracture of shaft of left clavicle, initial encounter for closed fracture: Secondary | ICD-10-CM

## 2024-07-25 MED ORDER — TRAMADOL HCL 50 MG PO TABS: 50.0000 mg | ORAL_TABLET | Freq: Two times a day (BID) | ORAL | 1 refills | Status: DC | PRN
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
## 2024-07-29 ENCOUNTER — Ambulatory Visit: Payer: Self-pay

## 2024-07-29 NOTE — Telephone Encounter (Signed)
 FYI Only or Action Required?: FYI only for provider: appointment scheduled on 07/30/24.  Patient was last seen in primary care on 06/17/2024 by Travis Alm RAMAN, PA-C.  Called Nurse Triage reporting Otalgia.  Symptoms began a week ago.  Interventions attempted: Nothing.  Symptoms are: stable.  Triage Disposition: See Physician Within 24 Hours  Patient/caregiver understands and will follow disposition?: Yes Reason for Disposition  Earache  (Exceptions: Brief ear pain of lasting less than 60 minutes, or earache occurring during air travel.)  Answer Assessment - Initial Assessment Questions [name] [name] calling in today. Patient mentioned he also needs feet looked at, as he think he has athletes foot.  1. LOCATION: Which ear is involved?     Left ear  2. ONSET: When did the ear pain start?      1 week  3. FEVER: Do you have a fever? If Yes, ask: What is your temperature, how was it measured, and when did it start?     [name] stated patient said yes he does have a fever but unsure of temp  4.  OTHER SYMPTOMS: Do you have any other symptoms? (e.g., decreased hearing, dizziness, headache, stiff neck, vomiting)     Denies  Protocols used: Earache-A-AH  Copied from CRM #[id number]. Topic: Clinical - Red Word Triage >> Jul 29, 2024  2:45 PM Tinnie BROCKS wrote: Red Word that prompted transfer to Nurse Triage: Daughter on dpr chanel Seddon calling to make appt for possible ear infection, severe left ear pain for 1 week. Established with PFM.

## 2024-07-30 ENCOUNTER — Ambulatory Visit: Admitting: Family Medicine

## 2024-07-30 VITALS — BP 128/74 | HR 86 | Ht 69.0 in | Wt 118.6 lb

## 2024-07-30 DIAGNOSIS — B353 Tinea pedis: Secondary | ICD-10-CM | POA: Diagnosis not present

## 2024-07-30 DIAGNOSIS — M25561 Pain in right knee: Secondary | ICD-10-CM

## 2024-07-30 DIAGNOSIS — T07XXXA Unspecified multiple injuries, initial encounter: Secondary | ICD-10-CM

## 2024-07-30 DIAGNOSIS — H6502 Acute serous otitis media, left ear: Secondary | ICD-10-CM

## 2024-07-30 DIAGNOSIS — M25562 Pain in left knee: Secondary | ICD-10-CM

## 2024-07-30 DIAGNOSIS — Z23 Encounter for immunization: Secondary | ICD-10-CM | POA: Diagnosis not present

## 2024-07-30 DIAGNOSIS — G8929 Other chronic pain: Secondary | ICD-10-CM

## 2024-07-30 MED ORDER — AMOXICILLIN 875 MG PO TABS: 875.0000 mg | ORAL_TABLET | Freq: Two times a day (BID) | ORAL | 0 refills | Status: DC
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
## 2024-07-30 NOTE — Progress Notes (Signed)
Other  Other

## 2024-07-31 ENCOUNTER — Ambulatory Visit: Payer: Self-pay

## 2024-08-01 ENCOUNTER — Ambulatory Visit: Attending: Physician Assistant

## 2024-08-01 ENCOUNTER — Other Ambulatory Visit: Payer: Self-pay

## 2024-08-01 DIAGNOSIS — S42022A Displaced fracture of shaft of left clavicle, initial encounter for closed fracture: Secondary | ICD-10-CM | POA: Insufficient documentation

## 2024-08-01 DIAGNOSIS — M898X1 Other specified disorders of bone, shoulder: Secondary | ICD-10-CM | POA: Insufficient documentation
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
## 2024-08-05 ENCOUNTER — Ambulatory Visit
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
## 2024-08-06 NOTE — Addendum Note (Signed)
 Addended by: NYLE BARER A on: 08/06/2024 01:44 PM   Modules accepted: Orders

## 2024-08-11 ENCOUNTER — Ambulatory Visit: Attending: Physician Assistant

## 2024-08-11 ENCOUNTER — Telehealth: Payer: Self-pay

## 2024-08-11 DIAGNOSIS — M898X1 Other specified disorders of bone, shoulder: Secondary | ICD-10-CM | POA: Insufficient documentation

## 2024-08-11 NOTE — Telephone Encounter (Signed)
 Attempted to call patient regarding missed appointment but unable to connect or reach a VM.  1st no-show  Travis Lutz, Travis Lutz 08/11/24 11:04 AM
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
## 2024-08-13 ENCOUNTER — Ambulatory Visit: Attending: Cardiology | Admitting: Cardiology

## 2024-08-13 ENCOUNTER — Encounter: Payer: Self-pay | Admitting: Cardiology

## 2024-08-13 VITALS — BP 124/67 | HR 87 | Temp 98.3°F | Resp 16 | Ht 69.0 in | Wt 121.9 lb

## 2024-08-13 DIAGNOSIS — R9431 Abnormal electrocardiogram [ECG] [EKG]: Secondary | ICD-10-CM | POA: Diagnosis not present

## 2024-08-13 DIAGNOSIS — E782 Mixed hyperlipidemia: Secondary | ICD-10-CM

## 2024-08-13 DIAGNOSIS — I700 Atherosclerosis of aorta: Secondary | ICD-10-CM

## 2024-08-13 DIAGNOSIS — I10 Essential (primary) hypertension: Secondary | ICD-10-CM

## 2024-08-13 DIAGNOSIS — F17200 Nicotine dependence, unspecified, uncomplicated: Secondary | ICD-10-CM | POA: Diagnosis not present

## 2024-08-13 MED ORDER — NICOTINE 7 MG/24HR TD PT24: 7.0000 mg | MEDICATED_PATCH | Freq: Every day | TRANSDERMAL | 1 refills | Status: DC

## 2024-08-13 MED ORDER — BUPROPION HCL ER (SR) 100 MG PO TB12: 100.0000 mg | ORAL_TABLET | Freq: Two times a day (BID) | ORAL | 3 refills | Status: DC

## 2024-08-13 MED ORDER — ATORVASTATIN CALCIUM 80 MG PO TABS: 80.0000 mg | ORAL_TABLET | Freq: Every day | ORAL | 3 refills | Status: AC

## 2024-08-13 NOTE — Patient Instructions (Signed)
 Medication Instructions:  Your physician has recommended you make the following change in your medication:  1) START taking atorvastatin  (Lipitor) 80 mg once daily  2) START taking Wellbutrin  100 mg once daily for three days, then increase to twice daily  3) START using nicoderm patch weekly  *If you need a refill on your cardiac medications before your next appointment, please call your pharmacy*   Testing/Procedures: Echocardiogram  Your physician has requested that you have an echocardiogram. Echocardiography is a painless test that uses sound waves to create images of your heart. It provides your doctor with information about the size and shape of your heart and how well your heart's chambers and valves are working. This procedure takes approximately one hour. There are no restrictions for this procedure. Please do NOT wear cologne, perfume, aftershave, or lotions (deodorant is allowed). Please arrive 15 minutes prior to your appointment time.  Please note: We ask at that you not bring children with you during ultrasound (echo/ vascular) testing. Due to room size and safety concerns, children are not allowed in the ultrasound rooms during exams. Our front office staff cannot provide observation of children in our lobby area while testing is being conducted. An adult accompanying a patient to their appointment will only be allowed in the ultrasound room at the discretion of the ultrasound technician under special circumstances. We apologize for any inconvenience.  Lexiscan Myoview  Your physician has requested that you have a lexiscan myoview. For further information please visit [email or web address] Please follow instruction sheet, as given.   Follow-Up: At [facility], you and your health needs are our priority.  As part of our continuing mission to provide you with exceptional heart care, our providers are all part of one team.  This team includes your primary Cardiologist  (physician) and Advanced Practice Providers or APPs (Physician Assistants and Nurse Practitioners) who all work together to provide you with the care you need, when you need it.  Your next appointment:   2 months  Provider:   Dr. Gordy Bergamo
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
## 2024-08-14 ENCOUNTER — Ambulatory Visit

## 2024-08-14 DIAGNOSIS — M898X1 Other specified disorders of bone, shoulder: Secondary | ICD-10-CM
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
## 2024-08-19 ENCOUNTER — Ambulatory Visit
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
## 2024-08-21 ENCOUNTER — Telehealth: Payer: Self-pay

## 2024-08-21 ENCOUNTER — Ambulatory Visit
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
## 2024-08-21 NOTE — Telephone Encounter (Signed)
 Attempted to call regarding missed visit, unable to connect and unable to leave a VM.  Only schedule 1 at a time moving forward.  2nd no-show  Travis Lutz, [place] 08/21/2024 11:04 AM

## 2024-08-26 ENCOUNTER — Other Ambulatory Visit: Payer: Self-pay

## 2024-08-26 ENCOUNTER — Ambulatory Visit: Admitting: Orthopaedic Surgery

## 2024-08-26 ENCOUNTER — Ambulatory Visit

## 2024-08-26 DIAGNOSIS — S42022A Displaced fracture of shaft of left clavicle, initial encounter for closed fracture: Secondary | ICD-10-CM

## 2024-08-26 DIAGNOSIS — M1711 Unilateral primary osteoarthritis, right knee: Secondary | ICD-10-CM

## 2024-08-26 MED ORDER — TRAMADOL HCL 50 MG PO TABS: 50.0000 mg | ORAL_TABLET | Freq: Two times a day (BID) | ORAL | 1 refills | Status: AC | PRN
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
## 2024-08-26 NOTE — Addendum Note (Signed)
 Addended by: JERRI LOISE SHARPER on: 08/26/2024 05:57 PM   Modules accepted: Level of Service

## 2024-08-26 NOTE — Progress Notes (Signed)
° °  08/26/2024  Patient ID: Travis Lutz, male   DOB: 28-Jun-1956, 68 y.o.   MRN: [id number]  Pharmacy Quality Measure Review  This patient is appearing on a report for being at risk of failing the adherence measure for cholesterol (statin) medications this calendar year.   Medication: Atorvastatin  Last fill date: 08/15/24 for 90 day supply  Insurance report was not up to date. No action needed at this time.   [name], PharmD Clinical Pharmacist [phone]

## 2024-09-03 ENCOUNTER — Telehealth: Payer: Self-pay

## 2024-09-03 ENCOUNTER — Telehealth: Payer: Self-pay | Admitting: Medical

## 2024-09-03 NOTE — Telephone Encounter (Signed)
 Copied from CRM [id number]. Topic: Clinical - Medical Advice >> Sep 03, 2024 12:14 PM Berwyn MATSU wrote: Reason for CRM: Patients daughter called in to advise that patient had a fainting episode or dizzy spell last week and she just found out about it and would like to let MD know. I offered triage patient declined as she wasn't actually with patient at the moment. She did request that I send a message.   I called [facility] to advise of refusal of triage and I advised CRMS would be sent.   May you please assist.

## 2024-09-03 NOTE — Telephone Encounter (Unsigned)
 Copied from CRM [id number]. Topic: Clinical - Refused Triage >> Sep 03, 2024 12:10 PM Berwyn MATSU wrote: Patient/caller voiced complaints of dizzy spells. Declined transfer to triage.

## 2024-09-12 ENCOUNTER — Other Ambulatory Visit: Payer: Self-pay | Admitting: Medical

## 2024-09-17 ENCOUNTER — Other Ambulatory Visit: Payer: Self-pay | Admitting: Cardiology

## 2024-09-17 DIAGNOSIS — R9431 Abnormal electrocardiogram [ECG] [EKG]: Secondary | ICD-10-CM

## 2024-09-18 ENCOUNTER — Telehealth (HOSPITAL_COMMUNITY): Payer: Self-pay | Admitting: *Deleted

## 2024-09-18 ENCOUNTER — Ambulatory Visit (INDEPENDENT_AMBULATORY_CARE_PROVIDER_SITE_OTHER): Admitting: Medical

## 2024-09-18 VITALS — BP 124/76 | HR 101 | Wt 121.4 lb

## 2024-09-18 DIAGNOSIS — I700 Atherosclerosis of aorta: Secondary | ICD-10-CM

## 2024-09-18 DIAGNOSIS — L98491 Non-pressure chronic ulcer of skin of other sites limited to breakdown of skin: Secondary | ICD-10-CM

## 2024-09-18 DIAGNOSIS — E782 Mixed hyperlipidemia: Secondary | ICD-10-CM | POA: Diagnosis not present

## 2024-09-18 DIAGNOSIS — D649 Anemia, unspecified: Secondary | ICD-10-CM

## 2024-09-18 DIAGNOSIS — R972 Elevated prostate specific antigen [PSA]: Secondary | ICD-10-CM | POA: Diagnosis not present

## 2024-09-18 DIAGNOSIS — E538 Deficiency of other specified B group vitamins: Secondary | ICD-10-CM

## 2024-09-18 DIAGNOSIS — L84 Corns and callosities: Secondary | ICD-10-CM | POA: Diagnosis not present

## 2024-09-18 DIAGNOSIS — B356 Tinea cruris: Secondary | ICD-10-CM | POA: Diagnosis not present

## 2024-09-18 DIAGNOSIS — Z01818 Encounter for other preprocedural examination: Secondary | ICD-10-CM | POA: Diagnosis not present

## 2024-09-18 DIAGNOSIS — I2510 Atherosclerotic heart disease of native coronary artery without angina pectoris: Secondary | ICD-10-CM

## 2024-09-18 DIAGNOSIS — F17200 Nicotine dependence, unspecified, uncomplicated: Secondary | ICD-10-CM

## 2024-09-18 DIAGNOSIS — R942 Abnormal results of pulmonary function studies: Secondary | ICD-10-CM | POA: Diagnosis not present

## 2024-09-18 MED ORDER — FLUCONAZOLE 100 MG PO TABS: 100.0000 mg | ORAL_TABLET | Freq: Every day | ORAL | 0 refills | Status: DC

## 2024-09-18 MED ORDER — CLOTRIMAZOLE-BETAMETHASONE 1-0.05 % EX CREA: 1.0000 | TOPICAL_CREAM | Freq: Every day | CUTANEOUS | 0 refills | Status: DC

## 2024-09-18 MED ORDER — MUPIROCIN 2 % EX OINT: 1.0000 | TOPICAL_OINTMENT | Freq: Two times a day (BID) | CUTANEOUS | 0 refills | Status: AC
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
## 2024-09-18 NOTE — Telephone Encounter (Signed)
 Spoke to patient as a reminder about his upcoming STRESS TEST on 09/25/24 at 10:00.
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
## 2024-09-19 ENCOUNTER — Ambulatory Visit: Payer: Self-pay | Admitting: Medical

## 2024-09-19 LAB — CBC WITH DIFFERENTIAL/PLATELET
Basophils Absolute: 0 x10E3/uL (ref 0.0–0.2)
Basos: 0 %
EOS (ABSOLUTE): 0.1 x10E3/uL (ref 0.0–0.4)
Eos: 1 %
Hematocrit: 43.2 % (ref 37.5–51.0)
Hemoglobin: 14.2 g/dL (ref 13.0–17.7)
Immature Grans (Abs): 0 x10E3/uL (ref 0.0–0.1)
Immature Granulocytes: 0 %
Lymphocytes Absolute: 1.2 x10E3/uL (ref 0.7–3.1)
Lymphs: 24 %
MCH: 33.4 pg — ABNORMAL HIGH (ref 26.6–33.0)
MCHC: 32.9 g/dL (ref 31.5–35.7)
MCV: 102 fL — ABNORMAL HIGH (ref 79–97)
Monocytes Absolute: 0.5 x10E3/uL (ref 0.1–0.9)
Monocytes: 11 %
Neutrophils Absolute: 3.2 x10E3/uL (ref 1.4–7.0)
Neutrophils: 64 %
Platelets: 337 x10E3/uL (ref 150–450)
RBC: 4.25 x10E6/uL (ref 4.14–5.80)
RDW: 14.1 % (ref 11.6–15.4)
WBC: 5 x10E3/uL (ref 3.4–10.8)

## 2024-09-19 LAB — BASIC METABOLIC PANEL WITH GFR
BUN/Creatinine Ratio: 15 (ref 10–24)
BUN: 12 mg/dL (ref 8–27)
CO2: 20 mmol/L (ref 20–29)
Calcium: 9.3 mg/dL (ref 8.6–10.2)
Chloride: 101 mmol/L (ref 96–106)
Creatinine, Ser: 0.78 mg/dL (ref 0.76–1.27)
Glucose: 100 mg/dL — ABNORMAL HIGH (ref 70–99)
Potassium: 4.1 mmol/L (ref 3.5–5.2)
Sodium: 140 mmol/L (ref 134–144)
eGFR: 98 mL/min/1.73

## 2024-09-19 LAB — PSA: Prostate Specific Ag, Serum: 6.8 ng/mL — ABNORMAL HIGH (ref 0.0–4.0)

## 2024-09-19 LAB — VITAMIN B12: Vitamin B-12: 254 pg/mL (ref 232–1245)

## 2024-09-19 LAB — VITAMIN D 25 HYDROXY (VIT D DEFICIENCY, FRACTURES): Vit D, 25-Hydroxy: 59.5 ng/mL (ref 30.0–100.0)

## 2024-09-19 LAB — LIPID PANEL
Chol/HDL Ratio: 3.3 ratio (ref 0.0–5.0)
Cholesterol, Total: 234 mg/dL — ABNORMAL HIGH (ref 100–199)
HDL: 72 mg/dL
LDL Chol Calc (NIH): 154 mg/dL — ABNORMAL HIGH (ref 0–99)
Triglycerides: 50 mg/dL (ref 0–149)
VLDL Cholesterol Cal: 8 mg/dL (ref 5–40)

## 2024-09-19 NOTE — Progress Notes (Signed)
 I had just increased his atorvastatin  to 80 and not sure he is still taking 10 mg.  His tests are on 15 and urgent surgery, I can have this done  2-3 days sooner as this is a weekend

## 2024-09-19 NOTE — Progress Notes (Signed)
 Hello Dr. Ladona  I saw our mutual patient yesterday for surgery preop.  He still has testing pending from you correct?  Please let me know when you feel he is clear for surgery.  Thanks [employer]

## 2024-09-19 NOTE — Progress Notes (Signed)
 PSA elevated.  What is status on referral to urology? I don't think he has seen them yet.  Cholesterol is still high, somewhat improved.  Is he taking Lipitor 80mg  EVERY day?  If not, needs to be.  If he is taking Lipitor daily, I recommend adding every 2 week Repatha or Praluent injectable to further lower his quite high cholesterol.   Rest of labs ok.   Surgery clearance will depend upon cardiology clearance.

## 2024-09-25 ENCOUNTER — Ambulatory Visit (HOSPITAL_BASED_OUTPATIENT_CLINIC_OR_DEPARTMENT_OTHER)
Admission: RE | Admit: 2024-09-25 | Discharge: 2024-09-25 | Disposition: A | Discharge status: Home / Self Care | Source: Ambulatory Visit | Attending: Cardiology | Admitting: Cardiology

## 2024-09-25 ENCOUNTER — Ambulatory Visit (HOSPITAL_COMMUNITY)
Admission: RE | Admit: 2024-09-25 | Discharge: 2024-09-25 | Disposition: A | Discharge status: Home / Self Care | Source: Ambulatory Visit | Attending: Internal Medicine | Admitting: Internal Medicine

## 2024-09-25 ENCOUNTER — Ambulatory Visit: Payer: Self-pay | Admitting: Cardiology

## 2024-09-25 DIAGNOSIS — E782 Mixed hyperlipidemia: Secondary | ICD-10-CM | POA: Insufficient documentation

## 2024-09-25 DIAGNOSIS — R9431 Abnormal electrocardiogram [ECG] [EKG]: Secondary | ICD-10-CM

## 2024-09-25 DIAGNOSIS — F17200 Nicotine dependence, unspecified, uncomplicated: Secondary | ICD-10-CM | POA: Diagnosis present

## 2024-09-25 DIAGNOSIS — I700 Atherosclerosis of aorta: Secondary | ICD-10-CM | POA: Insufficient documentation

## 2024-09-25 DIAGNOSIS — I10 Essential (primary) hypertension: Secondary | ICD-10-CM | POA: Diagnosis present

## 2024-09-25 LAB — MYOCARDIAL PERFUSION IMAGING
LV dias vol: 103 mL (ref 62–150)
LV sys vol: 61 mL
Nuc Stress EF: 41 %
Peak HR: 102 {beats}/min
Rest HR: 81 {beats}/min
Rest Nuclear Isotope Dose: 8.1 mCi
SDS: 0
SRS: 4
SSS: 0
ST Depression (mm): 0 mm
Stress Nuclear Isotope Dose: 27.3 mCi
TID: 0.99

## 2024-09-25 LAB — ECHOCARDIOGRAM COMPLETE
Area-P 1/2: 2.73 cm2
S' Lateral: 4.27 cm

## 2024-09-25 MED ORDER — PERFLUTREN LIPID MICROSPHERE
1.0000 mL | INTRAVENOUS | Status: AC | PRN
  Administered 2024-09-25: 2 mL via INTRAVENOUS

## 2024-09-25 MED ORDER — TECHNETIUM TC 99M TETROFOSMIN IV KIT
27.3000 | PACK | Freq: Once | INTRAVENOUS | Status: AC | PRN
  Administered 2024-09-25: 27.3 via INTRAVENOUS

## 2024-09-25 MED ORDER — REGADENOSON 0.4 MG/5ML IV SOLN
INTRAVENOUS | Status: AC
  Filled 2024-09-25: qty 5

## 2024-09-25 MED ORDER — TECHNETIUM TC 99M TETROFOSMIN IV KIT
8.1000 | PACK | Freq: Once | INTRAVENOUS | Status: AC | PRN
  Administered 2024-09-25: 8.1 via INTRAVENOUS

## 2024-09-25 MED ORDER — REGADENOSON 0.4 MG/5ML IV SOLN
0.4000 mg | Freq: Once | INTRAVENOUS | Status: AC
  Administered 2024-09-25: 0.4 mg via INTRAVENOUS

## 2024-09-25 NOTE — Progress Notes (Signed)
 Your stress test is abnormal as well as echocardiogram revealing abnormality, I will probably recommend proceeding with cardiac catheterization, it is not urgent and our office will be in touch with you and contact you regarding next steps.

## 2024-09-25 NOTE — Progress Notes (Signed)
 Please schedule all his tests ASAP and also office visit ASAP

## 2024-10-02 ENCOUNTER — Encounter: Payer: Self-pay | Admitting: Cardiology

## 2024-10-02 ENCOUNTER — Other Ambulatory Visit (HOSPITAL_COMMUNITY): Payer: Self-pay

## 2024-10-02 ENCOUNTER — Ambulatory Visit: Admitting: Cardiology

## 2024-10-02 ENCOUNTER — Ambulatory Visit: Attending: Cardiology | Admitting: Cardiology

## 2024-10-02 VITALS — BP 105/61 | HR 87 | Ht 66.0 in | Wt 125.8 lb

## 2024-10-02 DIAGNOSIS — I10 Essential (primary) hypertension: Secondary | ICD-10-CM

## 2024-10-02 DIAGNOSIS — R9439 Abnormal result of other cardiovascular function study: Secondary | ICD-10-CM | POA: Diagnosis not present

## 2024-10-02 DIAGNOSIS — I2510 Atherosclerotic heart disease of native coronary artery without angina pectoris: Secondary | ICD-10-CM

## 2024-10-02 DIAGNOSIS — F17200 Nicotine dependence, unspecified, uncomplicated: Secondary | ICD-10-CM | POA: Diagnosis not present

## 2024-10-02 DIAGNOSIS — E782 Mixed hyperlipidemia: Secondary | ICD-10-CM | POA: Diagnosis not present

## 2024-10-02 MED ORDER — ASPIRIN 81 MG PO CHEW
81.0000 mg | CHEWABLE_TABLET | Freq: Every day | ORAL | 1 refills | Status: AC
  Filled 2024-10-02: qty 100, 100d supply, fill #0
  Filled 2024-10-02: qty 90, 90d supply, fill #0

## 2024-10-02 MED ORDER — ASPIRIN 81 MG PO CHEW: 81.0000 mg | CHEWABLE_TABLET | Freq: Every day | ORAL | 1 refills | Status: DC

## 2024-10-02 NOTE — Patient Instructions (Signed)
 Medication Instructions:  NO CHANGE  *If you need a refill on your cardiac medications before your next appointment, please call your pharmacy*  Lab Work: NONE NEEDED  If you have labs (blood work) drawn today and your tests are completely normal, you will receive your results only by: MyChart Message (if you have MyChart) OR A paper copy in the mail If you have any lab test that is abnormal or we need to change your treatment, we will call you to review the results.  Testing/Procedures:  [facility] A DEPT OF [facility] AT [address] A DEPT OF THE [facility] [address] [place] [address] Dept: [phone] Loc: [phone]  Travis Lutz  10/02/2024  You are scheduled for a Cardiac Catheterization on Wednesday, January 28 with Dr. Gordy Bergamo.  1. Please arrive at the [facility] (Main Entrance A) at [facility]: [address] [place], [place] [address] at 6:30 AM (This time is 2 hour(s) before your procedure to ensure your preparation).   Free valet parking service is available. You will check in at ADMITTING. The support person will be asked to wait in the waiting room.  It is OK to have someone drop you off and come back when you are ready to be discharged.    Special note: Every effort is made to have your procedure done on time. Please understand that emergencies sometimes delay scheduled procedures.  2. Diet: Nothing to eat after midnight.   3. Hydration: NPO AFTER MIDNIGHT  4. Labs: Already completed 09/18/2024  5. Medication instructions in preparation for your procedure:   Contrast Allergy: No   Current Outpatient Medications (Cardiovascular):    amLODipine  (NORVASC ) 5 MG tablet, Take 1 tablet (5 mg total) by mouth daily.   atorvastatin  (LIPITOR) 10 MG tablet, Take 10 mg by mouth daily.   atorvastatin  (LIPITOR) 80 MG tablet, Take 1 tablet (80 mg total) by mouth daily.  Current Outpatient  Medications (Analgesics):    diclofenac  (VOLTAREN ) 75 MG EC tablet, Take 1 tablet (75 mg total) by mouth 2 (two) times daily.   traMADol  (ULTRAM ) 50 MG tablet, Take 1 tablet (50 mg total) by mouth every 12 (twelve) hours as needed.   aspirin  (ASPIRIN  CHILDRENS) 81 MG chewable tablet, Chew 1 tablet (81 mg total) by mouth daily.  Current Outpatient Medications (Hematological):    cyanocobalamin  (VITAMIN B12) 1000 MCG tablet, Take 1 tablet (1,000 mcg total) by mouth daily.  Current Outpatient Medications (Other):    buPROPion  ER (WELLBUTRIN  SR) 100 MG 12 hr tablet, Take 1 tablet (100 mg total) by mouth 2 (two) times daily. One tab in morning daily for 3 days then twice daily   clotrimazole -betamethasone  (LOTRISONE ) cream, Apply 1 Application topically daily. Groin rash, apply for up to 10 days max   docusate sodium  (COLACE) 100 MG capsule, Take 1 capsule (100 mg total) by mouth daily as needed.   fluconazole  (DIFLUCAN ) 100 MG tablet, Take 1 tablet (100 mg total) by mouth daily.   mupirocin  ointment (BACTROBAN ) 2 %, Apply 1 Application topically 2 (two) times daily. To left foot ulcer   ondansetron  (ZOFRAN ) 4 MG tablet, Take 1 tablet (4 mg total) by mouth every 8 (eight) hours as needed for nausea or vomiting.   polyethylene glycol powder (GLYCOLAX /MIRALAX ) 17 GM/SCOOP powder, Take 17 g by mouth 2 (two) times daily as needed.   tamsulosin  (FLOMAX ) 0.4 MG CAPS capsule, TAKE 1 CAPSULE(0.4 MG)  BY MOUTH DAILY   Vitamin D , Ergocalciferol , (DRISDOL ) 1.25 MG (50000 UNIT) CAPS capsule, Take 1 capsule (50,000 Units total) by mouth every 7 (seven) days. *For reference purposes while preparing patient instructions.   Delete this med list prior to printing instructions for patient.*  On the morning of your procedure, take your Aspirin  81 mg and any morning medicines NOT listed above.  You may use sips of water .  6. Plan to go home the same day, you will only stay overnight if medically necessary. 7. Bring a  current list of your medications and current insurance cards. 8. You MUST have a responsible person to drive you home. 9. Someone MUST be with you the first 24 hours after you arrive home or your discharge will be delayed. 10. Please wear clothes that are easy to get on and off and wear slip-on shoes.  Thank you for allowing us  to care for you!   -- [place] [facility]   Follow-Up: At [facility], you and your health needs are our priority.  As part of our continuing mission to provide you with exceptional heart care, our providers are all part of one team.  This team includes your primary Cardiologist (physician) and Advanced Practice Providers or APPs (Physician Assistants and Nurse Practitioners) who all work together to provide you with the care you need, when you need it.  Your next appointment:   Will be determined after cardiac cath is completed  We recommend signing up for the patient portal called MyChart.  Sign up information is provided on this After Visit Summary.  MyChart is used to connect with patients for Virtual Visits (Telemedicine).  Patients are able to view lab/test results, encounter notes, upcoming appointments, etc.  Non-urgent messages can be sent to your provider as well.   To learn more about what you can do with MyChart, go to [email or web address].   Other Instructions         We recommend signing up for the patient portal called MyChart.  Patients are able to view lab/test results, encounter notes, upcoming appointments, etc.  Non-urgent messages can be sent to your provider as well, go to [email or web address].

## 2024-10-02 NOTE — Progress Notes (Signed)
 " Cardiology Office Note:  .   Date:  10/02/2024  ID:  Travis Lutz, DOB 1955-10-02, MRN [id number] PCP: Bulah Alm GORMAN DEVONNA  [facility] Providers Cardiologist:  Gordy Bergamo, MD   History of Present Illness: .   Travis Lutz is a 69 y.o. male patient referred to me for evaluation of abnormal EKG, on prior CT scan of the chest on 06/20/2021 and abdomen 05/11/2023 I have reviewed aortic atherosclerosis and coronary calcifications.  He has primary hypertension, hypercholesterolemia, LDL 209 and untreated, tobacco use disorder.  In view of his risk factors, I have recommended an echocardiogram and exercise nuclear stress test which he underwent in January 2026 revealing high risk features with multiple territory ischemia in the inferior wall and also moderately reduced LVEF at 41%, echocardiogram revealing marked decrease in EF to 30 to 35% with global hypokinesis.  In view of this, he now presents for follow-up.  On his last office visit had started him on atorvastatin  80 mg daily and also started him on Wellbutrin  for smoking cessation.   He is asymptomatic.  States that he is very active. 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     Discussed the use of AI scribe software for clinical note transcription with the patient, who gave verbal consent to proceed.  History of Present Illness Travis Lutz is a 69 year old male with coronary artery disease who presents for follow-up on abnormal heart test results.  He has coronary artery disease with aortic plaque and recent abnormal cardiac test results. He is concerned by these results despite diet changes and efforts to quit smoking. He has reduced smoking to about one to three cigarettes per day, about a pack every two weeks. He takes prescribed medications, including Wellbutrin  for anxiety and smoking cessation support.  Cardiac Studies relevent.    Cardiac Studies & Procedures    ______________________________________________________________________________________________ MYOCARDIAL PERFUSION IMAGING 09/25/2024   Findings are consistent with no ischemia. The study is intermediate risk.   No ST deviation was noted. Arrhythmias during stress: frequent PVCs. Arrhythmias during recovery: frequent PVCs. The ECG was not diagnostic due to pharmacologic protocol.   LV perfusion is abnormal. Defect 1: There is a medium defect with moderate reduction in uptake present in the apical to basal inferior location(s) that is fixed. There is abnormal wall motion in the defect area.   Left ventricular function is abnormal. Global function is moderately reduced. Nuclear stress EF: 41%. The left ventricular ejection fraction is moderately decreased (30-44%). End diastolic cavity size is normal. End systolic cavity size is normal.   Prior study not available for comparison.  Fixed inferior moderate perfusion defect. There is extracardiac activity in the area, but there is also global reduction in LV function. Could be artifact but cannot exclude ischemia. Frequent PVCs noted throughout study. No ischemia noted, but intermediate risk given LV dysfunction and fixed defect.  ECHOCARDIOGRAM COMPLETE 09/25/2024 1. Left ventricular ejection fraction, by estimation, is 30 to 35%. The left ventricle has moderately decreased function. The entire apex is akinetic. The anterior wall, antero-lateral wall, anterior septum, inferior wall, posterior wall, mid inferoseptal segment, and basal inferoseptal segment are hypokinetic. Left ventricular diastolic parameters are indeterminate. 2. Right ventricular systolic function is normal. The right ventricular size is normal. Tricuspid regurgitation signal is inadequate for assessing PA pressure. 3. The mitral valve is normal in structure. No evidence of mitral valve regurgitation. No evidence of mitral stenosis. 4. The  aortic valve is normal in structure. Aortic  valve regurgitation is not visualized. No aortic stenosis is present. 5. The inferior vena cava is normal in size with greater than 50% respiratory variability, suggesting right atrial pressure of 3 mmHg. ____________________________________________________________________________________________    EKG:   EKG Interpretation Date/Time:  Thursday October 02 2024 12:27:09 EST Ventricular Rate:  81 PR Interval:  132 QRS Duration:  88 QT Interval:  384 QTC Calculation: 446 R Axis:   73  Text Interpretation: EKG 10/02/2024: Normal sinus rhythm at rate of 81 bpm with frequent PVCs (3).  Left atrial enlargement.  LVH with repolarization abnormality, cannot exclude inferolateral ischemia.  When compared to 08/13/2024, no significant change. Confirmed by Ladona Milan [phone]) on 10/02/2024 5:11:58 PM  Labs   Lab Results  Component Value Date   CHOL 234 (H) 09/18/2024   HDL 72 09/18/2024   LDLCALC 154 (H) 09/18/2024   TRIG 50 09/18/2024   CHOLHDL 3.3 09/18/2024   No results found for: LIPOA  Recent Labs    05/10/24 0310 05/10/24 0311 05/10/24 1017 05/26/24 1026 09/18/24 1502  NA 140   < > 146* 137 140  K 4.1   < > 4.5 5.1 4.1  CL 106   < > 112* 102 101  CO2 23  --  17* 23 20  GLUCOSE 92   < > 124* 94 100*  BUN 18   < > 13 15 12   CREATININE 0.98   < > 0.89 1.02 0.78  CALCIUM  8.9  --  9.4 8.9 9.3  GFRNONAA >60  --  >60 >60  --    < > = values in this interval not displayed.    Lab Results  Component Value Date   ALT 24 05/26/2024   AST 30 05/26/2024   ALKPHOS 124 05/26/2024   BILITOT 0.9 05/26/2024      Latest Ref Rng & Units 09/18/2024    3:02 PM 05/10/2024   10:17 AM 05/10/2024    3:11 AM  CBC  WBC 3.4 - 10.8 x10E3/uL 5.0  11.8    Hemoglobin 13.0 - 17.7 g/dL 85.7  84.9  85.6   Hematocrit 37.5 - 51.0 % 43.2  45.0  42.0   Platelets 150 - 450 x10E3/uL 337  279     Lab Results  Component Value Date   HGBA1C 5.1 11/15/2015    Lab Results  Component Value Date   TSH  0.766 01/02/2024     ROS  Review of Systems  Cardiovascular:  Negative for chest pain, dyspnea on exertion and leg swelling.   Physical Exam:   VS:  BP 105/61 (BP Location: Left Arm, Patient Position: Sitting, Cuff Size: Normal)   Pulse 87   Ht 5' 6 (1.676 m)   Wt 125 lb 12.8 oz (57.1 kg)   SpO2 97%   BMI 20.30 kg/m    Wt Readings from Last 3 Encounters:  10/02/24 125 lb 12.8 oz (57.1 kg)  09/18/24 121 lb 6.4 oz (55.1 kg)  08/13/24 121 lb 14.4 oz (55.3 kg)    BP Readings from Last 3 Encounters:  10/02/24 105/61  09/18/24 124/76  08/13/24 124/67   Physical Exam Neck:     Vascular: No carotid bruit or JVD.  Cardiovascular:     Rate and Rhythm: Normal rate and regular rhythm.     Pulses: Intact distal pulses.     Heart sounds: Normal heart sounds. No murmur heard.    No gallop.  Pulmonary:  Effort: Pulmonary effort is normal.     Breath sounds: Normal breath sounds.  Abdominal:     General: Bowel sounds are normal.     Palpations: Abdomen is soft.  Musculoskeletal:     Right lower leg: No edema.     Left lower leg: No edema.    ASSESSMENT AND PLAN: .      ICD-10-CM   1. Abnormal nuclear stress test  R94.39 EKG 12-Lead    2. Coronary artery disease involving native coronary artery of native heart without angina pectoris  I25.10 aspirin  (ASPIRIN  CHILDRENS) 81 MG chewable tablet    DISCONTINUED: aspirin  (ASPIRIN  CHILDRENS) 81 MG chewable tablet    3. Mixed hyperlipidemia  E78.2     4. Primary hypertension  I10     5. Tobacco use disorder, mild, abuse  F17.200      Assessment & Plan Coronary artery disease Abnormal cardiovascular function studies results suggest potential multivessel CAD, high risk stress test and echocardiogram. Cardiac catheterization is planned to assess and potentially treat blockages.  - Scheduled cardiac catheterization to assess and potentially treat blockages. Schedule for cardiac catheterization, and possible angioplasty. We  discussed regarding risks, benefits, alternatives to this including stress testing, CTA and continued medical therapy. Patient wants to proceed. Understands <1-2% risk of death, stroke, MI, urgent CABG, bleeding, infection, renal failure but not limited to these.  - Prescribed aspirin  81 mg once daily  Nicotine  dependence Significant reduction in smoking to approximately one pack every two weeks. He is motivated to quit smoking completely. - Continue efforts to quit smoking - Continue Wellbutrin  which will also help with his anxiety, patient was very emotional when he heard about his cardiovascular function studies.  Hyperlipidemia Previously identified high cholesterol levels.  Anxiety disorder Anxiety is being managed with Wellbutrin , which also aids in smoking cessation. - Continue Wellbutrin  for anxiety management  Follow up: Post cardiac catheterization. I tried to call his daughter multiple times, but finally able to connect on MyChart.  Signed,  Gordy Bergamo, MD, [facility] 10/02/2024, 5:25 PM [facility] [address] [place], [place] [address] Phone: [phone]. Fax:  [phone]  "

## 2024-10-02 NOTE — H&P (View-Only) (Signed)
 " Cardiology Office Note:  .   Date:  10/02/2024  ID:  Travis Lutz, DOB 1955-10-02, MRN [id number] PCP: Bulah Alm GORMAN DEVONNA  [facility] Providers Cardiologist:  Gordy Bergamo, MD   History of Present Illness: .   Travis Lutz is a 69 y.o. male patient referred to me for evaluation of abnormal EKG, on prior CT scan of the chest on 06/20/2021 and abdomen 05/11/2023 I have reviewed aortic atherosclerosis and coronary calcifications.  He has primary hypertension, hypercholesterolemia, LDL 209 and untreated, tobacco use disorder.  In view of his risk factors, I have recommended an echocardiogram and exercise nuclear stress test which he underwent in January 2026 revealing high risk features with multiple territory ischemia in the inferior wall and also moderately reduced LVEF at 41%, echocardiogram revealing marked decrease in EF to 30 to 35% with global hypokinesis.  In view of this, he now presents for follow-up.  On his last office visit had started him on atorvastatin  80 mg daily and also started him on Wellbutrin  for smoking cessation.   He is asymptomatic.  States that he is very active. 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     Discussed the use of AI scribe software for clinical note transcription with the patient, who gave verbal consent to proceed.  History of Present Illness Travis Lutz is a 69 year old male with coronary artery disease who presents for follow-up on abnormal heart test results.  He has coronary artery disease with aortic plaque and recent abnormal cardiac test results. He is concerned by these results despite diet changes and efforts to quit smoking. He has reduced smoking to about one to three cigarettes per day, about a pack every two weeks. He takes prescribed medications, including Wellbutrin  for anxiety and smoking cessation support.  Cardiac Studies relevent.    Cardiac Studies & Procedures    ______________________________________________________________________________________________ MYOCARDIAL PERFUSION IMAGING 09/25/2024   Findings are consistent with no ischemia. The study is intermediate risk.   No ST deviation was noted. Arrhythmias during stress: frequent PVCs. Arrhythmias during recovery: frequent PVCs. The ECG was not diagnostic due to pharmacologic protocol.   LV perfusion is abnormal. Defect 1: There is a medium defect with moderate reduction in uptake present in the apical to basal inferior location(s) that is fixed. There is abnormal wall motion in the defect area.   Left ventricular function is abnormal. Global function is moderately reduced. Nuclear stress EF: 41%. The left ventricular ejection fraction is moderately decreased (30-44%). End diastolic cavity size is normal. End systolic cavity size is normal.   Prior study not available for comparison.  Fixed inferior moderate perfusion defect. There is extracardiac activity in the area, but there is also global reduction in LV function. Could be artifact but cannot exclude ischemia. Frequent PVCs noted throughout study. No ischemia noted, but intermediate risk given LV dysfunction and fixed defect.  ECHOCARDIOGRAM COMPLETE 09/25/2024 1. Left ventricular ejection fraction, by estimation, is 30 to 35%. The left ventricle has moderately decreased function. The entire apex is akinetic. The anterior wall, antero-lateral wall, anterior septum, inferior wall, posterior wall, mid inferoseptal segment, and basal inferoseptal segment are hypokinetic. Left ventricular diastolic parameters are indeterminate. 2. Right ventricular systolic function is normal. The right ventricular size is normal. Tricuspid regurgitation signal is inadequate for assessing PA pressure. 3. The mitral valve is normal in structure. No evidence of mitral valve regurgitation. No evidence of mitral stenosis. 4. The  aortic valve is normal in structure. Aortic  valve regurgitation is not visualized. No aortic stenosis is present. 5. The inferior vena cava is normal in size with greater than 50% respiratory variability, suggesting right atrial pressure of 3 mmHg. ____________________________________________________________________________________________    EKG:   EKG Interpretation Date/Time:  Thursday October 02 2024 12:27:09 EST Ventricular Rate:  81 PR Interval:  132 QRS Duration:  88 QT Interval:  384 QTC Calculation: 446 R Axis:   73  Text Interpretation: EKG 10/02/2024: Normal sinus rhythm at rate of 81 bpm with frequent PVCs (3).  Left atrial enlargement.  LVH with repolarization abnormality, cannot exclude inferolateral ischemia.  When compared to 08/13/2024, no significant change. Confirmed by Ladona Milan [phone]) on 10/02/2024 5:11:58 PM  Labs   Lab Results  Component Value Date   CHOL 234 (H) 09/18/2024   HDL 72 09/18/2024   LDLCALC 154 (H) 09/18/2024   TRIG 50 09/18/2024   CHOLHDL 3.3 09/18/2024   No results found for: LIPOA  Recent Labs    05/10/24 0310 05/10/24 0311 05/10/24 1017 05/26/24 1026 09/18/24 1502  NA 140   < > 146* 137 140  K 4.1   < > 4.5 5.1 4.1  CL 106   < > 112* 102 101  CO2 23  --  17* 23 20  GLUCOSE 92   < > 124* 94 100*  BUN 18   < > 13 15 12   CREATININE 0.98   < > 0.89 1.02 0.78  CALCIUM  8.9  --  9.4 8.9 9.3  GFRNONAA >60  --  >60 >60  --    < > = values in this interval not displayed.    Lab Results  Component Value Date   ALT 24 05/26/2024   AST 30 05/26/2024   ALKPHOS 124 05/26/2024   BILITOT 0.9 05/26/2024      Latest Ref Rng & Units 09/18/2024    3:02 PM 05/10/2024   10:17 AM 05/10/2024    3:11 AM  CBC  WBC 3.4 - 10.8 x10E3/uL 5.0  11.8    Hemoglobin 13.0 - 17.7 g/dL 85.7  84.9  85.6   Hematocrit 37.5 - 51.0 % 43.2  45.0  42.0   Platelets 150 - 450 x10E3/uL 337  279     Lab Results  Component Value Date   HGBA1C 5.1 11/15/2015    Lab Results  Component Value Date   TSH  0.766 01/02/2024     ROS  Review of Systems  Cardiovascular:  Negative for chest pain, dyspnea on exertion and leg swelling.   Physical Exam:   VS:  BP 105/61 (BP Location: Left Arm, Patient Position: Sitting, Cuff Size: Normal)   Pulse 87   Ht 5' 6 (1.676 m)   Wt 125 lb 12.8 oz (57.1 kg)   SpO2 97%   BMI 20.30 kg/m    Wt Readings from Last 3 Encounters:  10/02/24 125 lb 12.8 oz (57.1 kg)  09/18/24 121 lb 6.4 oz (55.1 kg)  08/13/24 121 lb 14.4 oz (55.3 kg)    BP Readings from Last 3 Encounters:  10/02/24 105/61  09/18/24 124/76  08/13/24 124/67   Physical Exam Neck:     Vascular: No carotid bruit or JVD.  Cardiovascular:     Rate and Rhythm: Normal rate and regular rhythm.     Pulses: Intact distal pulses.     Heart sounds: Normal heart sounds. No murmur heard.    No gallop.  Pulmonary:  Effort: Pulmonary effort is normal.     Breath sounds: Normal breath sounds.  Abdominal:     General: Bowel sounds are normal.     Palpations: Abdomen is soft.  Musculoskeletal:     Right lower leg: No edema.     Left lower leg: No edema.    ASSESSMENT AND PLAN: .      ICD-10-CM   1. Abnormal nuclear stress test  R94.39 EKG 12-Lead    2. Coronary artery disease involving native coronary artery of native heart without angina pectoris  I25.10 aspirin  (ASPIRIN  CHILDRENS) 81 MG chewable tablet    DISCONTINUED: aspirin  (ASPIRIN  CHILDRENS) 81 MG chewable tablet    3. Mixed hyperlipidemia  E78.2     4. Primary hypertension  I10     5. Tobacco use disorder, mild, abuse  F17.200      Assessment & Plan Coronary artery disease Abnormal cardiovascular function studies results suggest potential multivessel CAD, high risk stress test and echocardiogram. Cardiac catheterization is planned to assess and potentially treat blockages.  - Scheduled cardiac catheterization to assess and potentially treat blockages. Schedule for cardiac catheterization, and possible angioplasty. We  discussed regarding risks, benefits, alternatives to this including stress testing, CTA and continued medical therapy. Patient wants to proceed. Understands <1-2% risk of death, stroke, MI, urgent CABG, bleeding, infection, renal failure but not limited to these.  - Prescribed aspirin  81 mg once daily  Nicotine  dependence Significant reduction in smoking to approximately one pack every two weeks. He is motivated to quit smoking completely. - Continue efforts to quit smoking - Continue Wellbutrin  which will also help with his anxiety, patient was very emotional when he heard about his cardiovascular function studies.  Hyperlipidemia Previously identified high cholesterol levels.  Anxiety disorder Anxiety is being managed with Wellbutrin , which also aids in smoking cessation. - Continue Wellbutrin  for anxiety management  Follow up: Post cardiac catheterization. I tried to call his daughter multiple times, but finally able to connect on MyChart.  Signed,  Gordy Bergamo, MD, [facility] 10/02/2024, 5:25 PM [facility] [address] [place], [place] [address] Phone: [phone]. Fax:  [phone]  "

## 2024-10-03 ENCOUNTER — Telehealth: Payer: Self-pay

## 2024-10-03 NOTE — Telephone Encounter (Signed)
 Spoke with daughter, she will wait for your call, and she also has Mychart. The number you tried to call her on [phone] is the correct one. She also has access of patient's MyChart, you can send her a message on there.  LIPID added and msg sent on Mychart when she confirms Atorvastatin  strength. Daughter is aware and will wait on your call.

## 2024-10-07 ENCOUNTER — Telehealth: Payer: Self-pay | Admitting: *Deleted

## 2024-10-07 NOTE — Telephone Encounter (Signed)
 Call to patient to review instructions, no answer, voicemail message.

## 2024-10-07 NOTE — Telephone Encounter (Signed)
 Reviewed procedure instructions with patient's daughter (DPR), [name].

## 2024-10-07 NOTE — Telephone Encounter (Addendum)
 Cardiac Catheterization scheduled at [facility] for: Wednesday October 08, 2024 8:30 AM Arrival time [facility] Main Entrance A at: 6:30 AM  Diet: -Nothing to eat after midnight.  Hydration: -May drink clear liquids until 2 hours before the procedure.  Approved liquids: Water , clear tea, black coffee, fruit juices-non-citric and without pulp,Gatorade, plain Jello/popsicles.   -Please drink 8 oz of water  2 hours before procedure.  Medication instructions: -Usual morning medications can be taken including aspirin  81 mg.  Plan to go home the same day, you will only stay overnight if medically necessary.  You must have responsible adult to drive you home.  Someone must be with you the first 24 hours after you arrive home.

## 2024-10-08 ENCOUNTER — Other Ambulatory Visit: Payer: Self-pay

## 2024-10-08 ENCOUNTER — Ambulatory Visit (HOSPITAL_COMMUNITY)
Admission: RE | Admit: 2024-10-08 | Discharge: 2024-10-08 | Disposition: A | Attending: Cardiology | Admitting: Cardiology

## 2024-10-08 ENCOUNTER — Encounter (HOSPITAL_COMMUNITY)
Admission: RE | Disposition: A | Discharge status: Home / Self Care | Payer: Self-pay | Source: Home / Self Care | Attending: Cardiology

## 2024-10-08 DIAGNOSIS — I2510 Atherosclerotic heart disease of native coronary artery without angina pectoris: Secondary | ICD-10-CM | POA: Diagnosis present

## 2024-10-08 DIAGNOSIS — F419 Anxiety disorder, unspecified: Secondary | ICD-10-CM | POA: Diagnosis not present

## 2024-10-08 DIAGNOSIS — F17200 Nicotine dependence, unspecified, uncomplicated: Secondary | ICD-10-CM

## 2024-10-08 DIAGNOSIS — I10 Essential (primary) hypertension: Secondary | ICD-10-CM | POA: Insufficient documentation

## 2024-10-08 DIAGNOSIS — Z7982 Long term (current) use of aspirin: Secondary | ICD-10-CM | POA: Insufficient documentation

## 2024-10-08 DIAGNOSIS — I4891 Unspecified atrial fibrillation: Secondary | ICD-10-CM | POA: Diagnosis not present

## 2024-10-08 DIAGNOSIS — F17210 Nicotine dependence, cigarettes, uncomplicated: Secondary | ICD-10-CM | POA: Diagnosis not present

## 2024-10-08 DIAGNOSIS — Z79899 Other long term (current) drug therapy: Secondary | ICD-10-CM | POA: Insufficient documentation

## 2024-10-08 DIAGNOSIS — E782 Mixed hyperlipidemia: Secondary | ICD-10-CM | POA: Diagnosis not present

## 2024-10-08 DIAGNOSIS — I2584 Coronary atherosclerosis due to calcified coronary lesion: Secondary | ICD-10-CM | POA: Diagnosis not present

## 2024-10-08 LAB — GLUCOSE, CAPILLARY: Glucose-Capillary: 86 mg/dL (ref 70–99)

## 2024-10-08 MED ORDER — HEPARIN SODIUM (PORCINE) 1000 UNIT/ML IJ SOLN
INTRAMUSCULAR | Status: AC
  Filled 2024-10-08: qty 10

## 2024-10-08 MED ORDER — FENTANYL CITRATE (PF) 100 MCG/2ML IJ SOLN
INTRAMUSCULAR | Status: AC
  Filled 2024-10-08: qty 2

## 2024-10-08 MED ORDER — ACETAMINOPHEN 325 MG PO TABS: 650.0000 mg | ORAL_TABLET | ORAL | Status: DC | PRN

## 2024-10-08 MED ORDER — BUPROPION HCL ER (SR) 100 MG PO TB12: 100.0000 mg | ORAL_TABLET | Freq: Two times a day (BID) | ORAL | Status: AC

## 2024-10-08 MED ORDER — LIDOCAINE HCL (PF) 1 % IJ SOLN
INTRAMUSCULAR | Status: AC
  Filled 2024-10-08: qty 30

## 2024-10-08 MED ORDER — IOHEXOL 350 MG/ML SOLN
INTRAVENOUS | Status: DC | PRN
  Administered 2024-10-08: 65 mL via INTRA_ARTERIAL

## 2024-10-08 MED ORDER — FREE WATER: 500.0000 mL | Freq: Once | Status: DC

## 2024-10-08 MED ORDER — MIDAZOLAM HCL (PF) 2 MG/2ML IJ SOLN
INTRAMUSCULAR | Status: DC | PRN
  Administered 2024-10-08: 2 mg via INTRAVENOUS

## 2024-10-08 MED ORDER — SODIUM CHLORIDE 0.9% FLUSH: 3.0000 mL | INTRAVENOUS | Status: DC | PRN

## 2024-10-08 MED ORDER — HYDRALAZINE HCL 20 MG/ML IJ SOLN: 10.0000 mg | INTRAMUSCULAR | Status: DC | PRN

## 2024-10-08 MED ORDER — SODIUM CHLORIDE 0.9 % IV SOLN: 250.0000 mL | INTRAVENOUS | Status: DC | PRN

## 2024-10-08 MED ORDER — SODIUM CHLORIDE 0.9% FLUSH: 3.0000 mL | Freq: Two times a day (BID) | INTRAVENOUS | Status: DC

## 2024-10-08 MED ORDER — VERAPAMIL HCL 2.5 MG/ML IV SOLN
INTRAVENOUS | Status: AC
  Filled 2024-10-08: qty 2

## 2024-10-08 MED ORDER — HEPARIN SODIUM (PORCINE) 1000 UNIT/ML IJ SOLN
INTRAMUSCULAR | Status: DC | PRN
  Administered 2024-10-08: 4000 [IU] via INTRAVENOUS

## 2024-10-08 MED ORDER — LIDOCAINE HCL (PF) 1 % IJ SOLN
INTRAMUSCULAR | Status: DC | PRN
  Administered 2024-10-08: 2 mL

## 2024-10-08 MED ORDER — ASPIRIN 81 MG PO CHEW: 81.0000 mg | CHEWABLE_TABLET | ORAL | Status: DC

## 2024-10-08 MED ORDER — HEPARIN (PORCINE) IN NACL 1000-0.9 UT/500ML-% IV SOLN
INTRAVENOUS | Status: DC | PRN
  Administered 2024-10-08: 1000 mL via SURGICAL_CAVITY

## 2024-10-08 MED ORDER — VERAPAMIL HCL 2.5 MG/ML IV SOLN
INTRAVENOUS | Status: DC | PRN
  Administered 2024-10-08: 10 mL via INTRA_ARTERIAL

## 2024-10-08 MED ORDER — FENTANYL CITRATE (PF) 100 MCG/2ML IJ SOLN
INTRAMUSCULAR | Status: DC | PRN
  Administered 2024-10-08: 50 ug via INTRAVENOUS

## 2024-10-08 MED ORDER — MIDAZOLAM HCL 2 MG/2ML IJ SOLN
INTRAMUSCULAR | Status: AC
  Filled 2024-10-08: qty 2

## 2024-10-08 MED ORDER — LABETALOL HCL 5 MG/ML IV SOLN: 10.0000 mg | INTRAVENOUS | Status: DC | PRN

## 2024-10-08 MED ORDER — FREE WATER: 250.0000 mL | Freq: Once | Status: DC

## 2024-10-08 MED ORDER — ONDANSETRON HCL 4 MG/2ML IJ SOLN: 4.0000 mg | Freq: Four times a day (QID) | INTRAMUSCULAR | Status: DC | PRN

## 2024-10-08 NOTE — Progress Notes (Signed)
 Pt and wife received discharge instructions, teach back performed. Iv removed, no complications. Rt radial site is clean dry intact, site is soft, no signs of bleeding. Pt escorted out via wheelchair to wife's vehicle.

## 2024-10-08 NOTE — Discharge Instructions (Signed)

## 2024-10-08 NOTE — Interval H&P Note (Signed)
 History and Physical Interval Note:  10/08/2024 8:42 AM  Travis Lutz  has presented today for surgery, with the diagnosis of afib.  The various methods of treatment have been discussed with the patient and family. After consideration of risks, benefits and other options for treatment, the patient has consented to  Procedures: LEFT HEART CATH AND CORONARY ANGIOGRAPHY (N/A) and possible coronary angioplasty for high risk  nuclear stress test and new HFrEF as a surgical intervention.  The patient's history has been reviewed, patient examined, no change in status, stable for surgery.  I have reviewed the patient's chart and labs.  Questions were answered to the patient's satisfaction.     Gordy Bergamo

## 2024-10-09 ENCOUNTER — Encounter (HOSPITAL_COMMUNITY): Payer: Self-pay | Admitting: Cardiology

## 2024-10-14 ENCOUNTER — Other Ambulatory Visit (HOSPITAL_COMMUNITY): Payer: Self-pay

## 2024-10-23 ENCOUNTER — Ambulatory Visit: Admitting: Cardiology
